# Patient Record
Sex: Female | Born: 1935 | Race: White | Hispanic: No | Marital: Married | Smoking: Never smoker
Health system: Southern US, Community
[De-identification: ages and names within clinical notes are randomized; demographics above are authoritative.]

## PROBLEM LIST (undated history)

## (undated) DIAGNOSIS — I499 Cardiac arrhythmia, unspecified: Secondary | ICD-10-CM

## (undated) DIAGNOSIS — I480 Paroxysmal atrial fibrillation: Secondary | ICD-10-CM

## (undated) DIAGNOSIS — K449 Diaphragmatic hernia without obstruction or gangrene: Secondary | ICD-10-CM

## (undated) DIAGNOSIS — C801 Malignant (primary) neoplasm, unspecified: Secondary | ICD-10-CM

## (undated) DIAGNOSIS — I10 Essential (primary) hypertension: Secondary | ICD-10-CM

## (undated) DIAGNOSIS — E785 Hyperlipidemia, unspecified: Secondary | ICD-10-CM

## (undated) DIAGNOSIS — Z9109 Other allergy status, other than to drugs and biological substances: Secondary | ICD-10-CM

## (undated) DIAGNOSIS — F419 Anxiety disorder, unspecified: Secondary | ICD-10-CM

## (undated) DIAGNOSIS — M1990 Unspecified osteoarthritis, unspecified site: Secondary | ICD-10-CM

## (undated) HISTORY — PX: CHOLECYSTECTOMY: SHX55

## (undated) HISTORY — DX: Other allergy status, other than to drugs and biological substances: Z91.09

## (undated) HISTORY — PX: OTHER SURGICAL HISTORY: SHX169

## (undated) HISTORY — DX: Diaphragmatic hernia without obstruction or gangrene: K44.9

## (undated) HISTORY — DX: Anxiety disorder, unspecified: F41.9

## (undated) HISTORY — DX: Unspecified osteoarthritis, unspecified site: M19.90

## (undated) HISTORY — PX: CYSTECTOMY: SUR359

## (undated) HISTORY — PX: ABDOMINAL HYSTERECTOMY: SHX81

---

## 1998-01-14 ENCOUNTER — Ambulatory Visit (HOSPITAL_COMMUNITY): Admission: RE | Admit: 1998-01-14 | Discharge: 1998-01-14 | Payer: Self-pay | Admitting: Obstetrics and Gynecology

## 1998-03-03 ENCOUNTER — Encounter: Admission: RE | Admit: 1998-03-03 | Discharge: 1998-06-01 | Payer: Self-pay | Admitting: Anesthesiology

## 1998-03-03 ENCOUNTER — Encounter: Payer: Self-pay | Admitting: Anesthesiology

## 1998-04-22 ENCOUNTER — Encounter: Payer: Self-pay | Admitting: Specialist

## 1998-04-22 ENCOUNTER — Ambulatory Visit (HOSPITAL_COMMUNITY): Admission: RE | Admit: 1998-04-22 | Discharge: 1998-04-22 | Payer: Self-pay | Admitting: Specialist

## 1998-05-05 ENCOUNTER — Encounter: Payer: Self-pay | Admitting: Specialist

## 1998-05-06 ENCOUNTER — Inpatient Hospital Stay (HOSPITAL_COMMUNITY): Admission: EM | Admit: 1998-05-06 | Discharge: 1998-05-07 | Payer: Self-pay | Admitting: Specialist

## 1999-04-20 ENCOUNTER — Encounter: Admission: RE | Admit: 1999-04-20 | Discharge: 1999-04-20 | Payer: Self-pay | Admitting: General Surgery

## 1999-04-20 ENCOUNTER — Encounter: Payer: Self-pay | Admitting: General Surgery

## 1999-05-20 ENCOUNTER — Ambulatory Visit (HOSPITAL_COMMUNITY): Admission: RE | Admit: 1999-05-20 | Discharge: 1999-05-20 | Payer: Self-pay | Admitting: *Deleted

## 1999-05-20 ENCOUNTER — Encounter (INDEPENDENT_AMBULATORY_CARE_PROVIDER_SITE_OTHER): Payer: Self-pay | Admitting: Specialist

## 1999-10-14 ENCOUNTER — Ambulatory Visit (HOSPITAL_COMMUNITY): Admission: RE | Admit: 1999-10-14 | Discharge: 1999-10-14 | Payer: Self-pay | Admitting: Specialist

## 1999-10-14 ENCOUNTER — Encounter: Payer: Self-pay | Admitting: Specialist

## 2002-06-30 ENCOUNTER — Encounter: Payer: Self-pay | Admitting: Emergency Medicine

## 2002-06-30 ENCOUNTER — Emergency Department (HOSPITAL_COMMUNITY): Admission: EM | Admit: 2002-06-30 | Discharge: 2002-06-30 | Payer: Self-pay | Admitting: Emergency Medicine

## 2002-10-06 ENCOUNTER — Emergency Department (HOSPITAL_COMMUNITY): Admission: EM | Admit: 2002-10-06 | Discharge: 2002-10-06 | Payer: Self-pay | Admitting: Emergency Medicine

## 2002-10-06 ENCOUNTER — Encounter: Payer: Self-pay | Admitting: Emergency Medicine

## 2007-09-10 ENCOUNTER — Encounter: Admission: RE | Admit: 2007-09-10 | Discharge: 2007-09-10 | Payer: Self-pay | Admitting: Family Medicine

## 2008-09-10 ENCOUNTER — Ambulatory Visit: Payer: Self-pay | Admitting: Vascular Surgery

## 2010-02-10 ENCOUNTER — Ambulatory Visit: Payer: Self-pay | Admitting: Cardiology

## 2010-04-18 ENCOUNTER — Emergency Department (HOSPITAL_BASED_OUTPATIENT_CLINIC_OR_DEPARTMENT_OTHER)
Admission: EM | Admit: 2010-04-18 | Discharge: 2010-04-18 | Payer: Self-pay | Source: Home / Self Care | Admitting: Emergency Medicine

## 2010-08-02 ENCOUNTER — Other Ambulatory Visit: Payer: Self-pay | Admitting: *Deleted

## 2010-08-02 DIAGNOSIS — E7800 Pure hypercholesterolemia, unspecified: Secondary | ICD-10-CM

## 2010-08-17 NOTE — Consult Note (Signed)
NEW PATIENT CONSULTATION   Emily Andrews, Emily Andrews  DOB:  [birth year]                                       09/10/2008  [id number]   Patient presents today for evaluation of lower extremity venous  pathology.  She is a very pleasant 76 year old white female with  progressive changes of venous hypertension in both lower extremities.  She reports that she had what looks like tributary phlebectomies of calf  varicosities 25 years ago, which was in her right calf.  She reports  multiple components of lower extremity discomfort.  This is more severe  in the right leg but is also present in the left leg as well.  She  reports an aching, burning sensation with prolonged standing.  She does  not have any significant swelling.  She does not have any history of  deep venous thrombosis.  She does have one episode of bleeding from her  left pretibial area over superficial telangiectasia.   PAST MEDICAL HISTORY:  1. Hypertension.  2. Elevated cholesterol.  3. Chronic atrial fibrillation, on Coumadin.   FAMILY HISTORY:  Negative for premature arteriosclerotic disease.   SOCIAL HISTORY:  She is married with 1 child.  She does not smoke or  drink alcohol.   REVIEW OF SYSTEMS:  Her weight is reported at 151 pounds.  She is 5 feet  4 inches tall.  She does have chronic atrial fibrillation since 2004.  She has esophageal reflux, pain in her legs with walking and lying flat.  She also has arthritic pain, particularly in her right knee.   She has multiple drug allergies and multiple medications listed in her  chart.   PHYSICAL EXAMINATION:  A well-developed and well-nourished white female  appearing stated age of 76.  Blood pressure is 156/82, pulse 78,  respirations 18.  Her radial and dorsalis pedis pulses are 2+  bilaterally.  She does have several old scars over her right calf from  her prior phlebectomies.  She does not have any significant varicose  veins.  She  does have multiple raised, prominent telangiectasia over  both calves.   She underwent noninvasive venous duplex in our office.  There is no  evidence of reflux in her deep system.  She does have reflux throughout  her right greater saphenous vein and no significant reflux in her left  greater saphenous vein.  She also has reflux in her right small  saphenous vein.   I discussed this at length with the patient.  I explained that in all  likelihood, we could make some impact on the aching discomfort that she  has in her right leg with prolonged standing due to her right saphenous  vein reflux.  I explained with the multiple other difficulties she has  from an orthopedic standpoint, that this would in all likelihood give  her minimal benefit since this is not the only issue involving her right  leg.  I would recommend continued observation currently, and she wishes  to pursue this as well.  I did explain treatment options __________ she  has been unable to tolerate in the past and also laser ablation of her  right greater saphenous vein for reduction in her venous hypertension.  She has had one bleed from the telangiectasia.  She does have a type of  telangiectasia which we do see persistent  bleeding episodes from.  I  explained that this would be potentially another indication if she had  persistent bleeding.  I did explain to her the importance of light  direct pressure over these areas of bleeding should it occur again.  She  understands and will see [place] again on an as-needed basis.   Rosetta Posner, M.D.  Electronically Signed   TFE/MEDQ  D:  09/10/2008  T:  09/10/2008  Job:  [id number]   cc:   Ludwig Lean. [name], M.D.  [place] Maceo Pro, M.D.

## 2010-08-17 NOTE — Procedures (Signed)
LOWER EXTREMITY VENOUS REFLUX EXAM   INDICATION:  Bilateral lower extremity spider veins.   EXAM:  Using color-flow imaging and pulse Doppler spectral analysis, the  right and left common femoral, superficial femoral, popliteal, posterior  tibial, greater and lesser saphenous veins are evaluated.  There is no  evidence suggesting deep venous insufficiency in the right and left  lower extremity.   The right saphenofemoral junction is not competent.  The right GSV is  not competent with the caliber as described below.   The right proximal short saphenous vein demonstrates incompetency.   GSV Diameter (used if found to be incompetent only)                                            Right    Left  Proximal Greater Saphenous Vein           0.59 cm  cm  Proximal-to-mid-thigh                     0.56 cm  cm  Mid thigh                                 0.42 cm  cm  Mid-distal thigh                          cm       cm  Distal thigh                              0.47 cm  cm  Knee                                      0.90 cm  cm    IMPRESSION:  1. Right greater saphenous vein reflux is identified with the caliber      ranging from 0.95 cm to 0.42 cm knee to groin.  2. The right and left greater saphenous veins are not aneurysmal.  3. The right and left greater saphenous veins are not tortuous.  4. The deep venous system is competent.  5. The right lesser saphenous vein is not competent.       ___________________________________________  Rosetta Posner, M.D.   AC/MEDQ  D:  09/10/2008  T:  09/10/2008  Job:  [id number]

## 2010-08-18 ENCOUNTER — Other Ambulatory Visit: Payer: Self-pay | Admitting: Cardiology

## 2010-08-18 ENCOUNTER — Ambulatory Visit (INDEPENDENT_AMBULATORY_CARE_PROVIDER_SITE_OTHER): Payer: Medicare Other | Admitting: Cardiology

## 2010-08-18 ENCOUNTER — Other Ambulatory Visit (INDEPENDENT_AMBULATORY_CARE_PROVIDER_SITE_OTHER): Payer: Medicare Other | Admitting: *Deleted

## 2010-08-18 ENCOUNTER — Encounter: Payer: Self-pay | Admitting: Cardiology

## 2010-08-18 DIAGNOSIS — M47812 Spondylosis without myelopathy or radiculopathy, cervical region: Secondary | ICD-10-CM | POA: Insufficient documentation

## 2010-08-18 DIAGNOSIS — I4891 Unspecified atrial fibrillation: Secondary | ICD-10-CM

## 2010-08-18 DIAGNOSIS — E7800 Pure hypercholesterolemia, unspecified: Secondary | ICD-10-CM | POA: Insufficient documentation

## 2010-08-18 DIAGNOSIS — I10 Essential (primary) hypertension: Secondary | ICD-10-CM

## 2010-08-18 LAB — LIPID PANEL
Cholesterol: 199 mg/dL (ref 0–200)
HDL: 45.9 mg/dL (ref >39.00)
LDL Cholesterol: 125 mg/dL — ABNORMAL HIGH (ref 0–99)
Total CHOL/HDL Ratio: 4
Triglycerides: 140 mg/dL (ref 0.0–149.0)

## 2010-08-18 LAB — BASIC METABOLIC PANEL
BUN: 13 mg/dL (ref 6–23)
Calcium: 9.7 mg/dL (ref 8.4–10.5)
Chloride: 106 mEq/L (ref 96–112)
Creatinine, Ser: 0.5 mg/dL (ref 0.4–1.2)

## 2010-08-18 LAB — HEPATIC FUNCTION PANEL
ALT: 45 U/L — ABNORMAL HIGH (ref 0–35)
Bilirubin, Direct: 0.1 mg/dL (ref 0.0–0.3)
Total Bilirubin: 1.2 mg/dL (ref 0.3–1.2)

## 2010-08-18 MED ORDER — DIGOXIN 250 MCG PO TABS: 250.0000 ug | ORAL_TABLET | Freq: Every day | ORAL | Status: DC

## 2010-08-18 NOTE — Telephone Encounter (Signed)
Has a question about a medication and to make sure that Dr. Mont Dutton gets a copy of the test results.

## 2010-08-18 NOTE — Telephone Encounter (Signed)
Pt requesting a refill on Digoxin 0.25 mg to [facility].  RN e-prescribed Digoxin to [facility].  Pt notified.

## 2010-08-18 NOTE — Assessment & Plan Note (Signed)
Blood pressure remains well controlled. She will see Dr.Dalton Mclean in followup

## 2010-08-18 NOTE — Assessment & Plan Note (Signed)
We will continue to encourage diet management. She thinks her lower extremity weakness has improved since off of statin therapy.

## 2010-08-18 NOTE — Assessment & Plan Note (Addendum)
She's not had recurrent for several years. She's not on warfarin in spite of an increasing [place] score because of age. We'll continue to leave her off of warfarin unless she has recurrence of atrial fibrillation.EKG today shows sinus rhythm but it does show poor R wave progression in V1 to V3

## 2010-08-18 NOTE — Progress Notes (Signed)
Subjective:   Emily Andrews is seen today for followup visit. 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 However, she is off of her statin therapy and thinks that her lower extremity weakness has clearly improved. She has a history of hyperlipemia.  She does have a history of mild hypertension as been well-controlled. There is a question of glucose intolerance that has improved with discontinuation of her statin therapy. We again reviewed the possibility of putting her back on warfarin elected not to do so because of weakness, arthritis in her right knee, and the lack of recurrence of her atrial fibrillation since before 2009  Current Outpatient Prescriptions  Medication Sig Dispense Refill  . amLODipine (NORVASC) 5 MG tablet Take 1 tablet by mouth Daily.      Marland Kitchen aspirin 81 MG tablet Take 81 mg by mouth daily.        . Calcium Carbonate-Vitamin D (CALTRATE 600+D PO) Take 1 tablet by mouth daily.        . digoxin (LANOXIN) 0.25 MG tablet Take 1 tablet by mouth Daily.      Marland Kitchen DIOVAN 320 MG tablet Take 1 tablet by mouth Daily.      . fish oil-omega-3 fatty acids 1000 MG capsule Take 1 g by mouth daily.        . magnesium gluconate (MAGONATE) 500 MG tablet Take 500 mg by mouth daily.        . Multiple Vitamins-Minerals (CENTRUM PO) Take 1 tablet by mouth daily.        . nadolol (CORGARD) 40 MG tablet Take 1 tablet by mouth Daily.      Marland Kitchen omeprazole (PRILOSEC) 20 MG capsule Take 20 mg by mouth daily.          Allergies  Allergen Reactions  . Celebrex (Celecoxib)   . Clindamycin/Lincomycin   . Penicillins   . Phenergan   . Robaxin   . Statins     There is no problem list on file for this patient.   History  Smoking status  . Never Smoker   Smokeless tobacco  . Never Used    History  Alcohol Use No    No family history on file.  Review of  Systems:   The patient denies any heat or cold intolerance.  No weight gain or weight loss.  The patient denies headaches or blurry vision.  There is no cough or sputum production.  The patient denies dizziness.  There is no hematuria or hematochezia.  The patient denies any muscle aches or arthritis.  The patient denies any rash.  The patient denies frequent falling or instability.  There is no history of depression or anxiety.  All other systems were reviewed and are negative.   Physical Exam:   Vital signs are reviewedThe head is normocephalic and atraumatic.  Pupils are equally round and reactive to light.  Sclerae nonicteric.  Conjunctiva is clear.  Oropharynx is unremarkable.  There's adequate oral airway.  Neck is supple there are no masses.  Thyroid is not enlarged.  There is no lymphadenopathy.  Lungs are clear.  Chest is symmetric.  Heart shows a regular rate and rhythm.  S1 and S2 are normal.  There is no murmur click or gallop.  Abdomen is soft normal bowel sounds.  There is no organomegaly.  Genital and rectal deferred.  Extremities are without  edema.  Peripheral pulses are adequate.  Neurologically intact.  Full range of motion.  The patient is not depressed.  Skin is warm and dry. Assessment / Plan:

## 2010-08-19 ENCOUNTER — Telehealth: Payer: Self-pay | Admitting: Cardiology

## 2010-08-19 NOTE — Telephone Encounter (Signed)
Lab results reviewed. Pt has appt with Dr. Maceo Pro in July. Aware of need for recheck of liver enzymes in 6 weeks.

## 2010-08-20 NOTE — Consult Note (Signed)
NAME:  Emily Andrews, [name]                 ACCOUNT NO.:  [id number]   MEDICAL RECORD NO.:  [id number]                   PATIENT TYPE:  EMS   LOCATION:  ED                                   FACILITY:  [facility]   PHYSICIAN:  Peter M. Martinique, M.D.               DATE OF BIRTH:  Jul 05, 1934   DATE OF CONSULTATION:  10/06/2002  DATE OF DISCHARGE:                                   CONSULTATION   REASON FOR CONSULTATION:  Ms. Arambul is a 76 year old white female with a  history of hypertension who presents for evaluation of persistent  palpitations.  The patient states approximately two months ago she had  persistent palpitations lasting over 10 hours.  She was subsequently  evaluated with 24-hour Holter monitor which showed no arrhythmias.  She has  had some moderate episodes of palpitations until approximately 2 p.m. today.  She developed recurrent sustained tachy palpitations lasting approximately  four hours.  She came to the emergency room where ECG showed atrial  fibrillation with a rate of 126 beats per minute.  She was given a bolus of  IV Cardizem and subsequently converted back to normal sinus rhythm.  She  denies any chest pain, shortness of breath, dizziness, or syncope.  She has  no prior history of angina or myocardial infarction.  She has no history of  CVA.   PAST MEDICAL HISTORY:  Significant for:  1. Hypertension.  2. She has had previous cholecystectomy.  3. Hysterectomy.  4. Removal of synovial cyst.  5. Removal of a lipoma from her right shoulder.  6. She has no history of diabetes or hypercholesterolemia.   ALLERGIES:  ROBAXIN, PENICILLIN, PHENERGAN, CLINDEX, TYLOX, CELEBREX, and  M__________.   MEDICATIONS:  Currently include:  1. Caltrate 2 tablets daily.  2. Vitamin E 400 international units daily.  3. Centrum daily.  4. Aspirin 81 mg per day.  5. Micardis 40 mg per day.  6. Nadolol 20 mg per day.  7. Premarin 0.625 mg daily.  8. Norvasc 5 mg per  day.   SOCIAL HISTORY:  The patient is married, with children.  She is a retired  Midwife.  She denies smoking or alcohol use and does not use  caffeine.   FAMILY HISTORY:  Noncontributory.   REVIEW OF SYSTEMS:  Otherwise negative.  The patient is very active, walks  two miles a day.   PHYSICAL EXAM:  GENERAL:  The patient is a well-developed white female in no  apparent distress.  VITAL SIGNS:  Blood pressure is 118/78, pulse is 74 and regular,  respirations are 20.  She is afebrile.  HEENT:  Pupils are equal, round, and reactive to light and accommodation.  Extraocular movements are full.  Oropharynx is clear.  NECK:  Without JVD, adenopathy, thyromegaly, or bruits.  LUNGS:  Clear.  CARDIAC:  Reveals regular rate and rhythm.  Without gallops, murmurs, rubs,  or clicks.  ABDOMEN:  Soft  and nontender.  There is no hepatosplenomegaly, masses, or  bruits.  EXTREMITIES:  Without edema.  Pulses are 2+ and symmetric.  NEUROLOGIC:  Intact.   LABORATORY DATA:  ECG shows atrial fibrillation, rate of 126.  There is LVH  by voltage.  There is evidence of anterior myocardial infarction, age  undetermined, with poor R-wave progression across the anterior precordium.  Cannot rule out old inferior infarction as well.  There are no acute ST and  T-wave changes.  Follow-up ECG shows normal sinus rhythm, rate of 64,  otherwise no change.  Chest x-ray shows borderline cardiomegaly.  No active  disease.   White count is 6800, hemoglobin 13.8, hematocrit 44.8, glucose 248.  CK-MB  and troponin are normal.  Sodium 141, potassium 3.8, chloride 109, CO2 27,  BUN 13, creatinine 0.7, glucose 96.   IMPRESSION:  1. Atrial fibrillation with rapid ventricular response, newly diagnosed, now     converted to normal sinus rhythm.  2. Hypertension.  3. Left ventricular hypertrophy.  4. Abnormal ECG, possible old anterior myocardial infarction.  Patient     asymptomatic.   PLAN:  Will discharge  to home.  Will increase nadolol to 40 mg per day for  rate control.  Will anticoagulate with Coumadin starting at 5 mg per day.  Will have her come in for her first pro time on Wednesday.  Will stop her  aspirin and vitamin E while on Coumadin.  Will schedule her for an  outpatient echocardiogram and have her follow up with Dr. [name] in  approximately 10 days.  May need to consider stress test as an outpatient as  well.                                               Peter M. Martinique, M.D.    PMJ/MEDQ  D:  10/06/2002  T:  10/06/2002  Job:  [id number]   cc:   Ludwig Lean. [name], M.D.  [id number] [place]  [place] [address]  Fax: [place] Maceo Pro, M.D.  [address] [place]  [place] [address]  Fax: [phone]

## 2011-02-16 ENCOUNTER — Ambulatory Visit (INDEPENDENT_AMBULATORY_CARE_PROVIDER_SITE_OTHER): Payer: Medicare Other | Admitting: Cardiology

## 2011-02-16 ENCOUNTER — Encounter (INDEPENDENT_AMBULATORY_CARE_PROVIDER_SITE_OTHER): Payer: Medicare Other

## 2011-02-16 ENCOUNTER — Encounter: Payer: Self-pay | Admitting: Cardiology

## 2011-02-16 VITALS — BP 114/78 | HR 51 | Ht 64.0 in | Wt 151.0 lb

## 2011-02-16 DIAGNOSIS — E7800 Pure hypercholesterolemia, unspecified: Secondary | ICD-10-CM

## 2011-02-16 DIAGNOSIS — R7989 Other specified abnormal findings of blood chemistry: Secondary | ICD-10-CM

## 2011-02-16 DIAGNOSIS — I4891 Unspecified atrial fibrillation: Secondary | ICD-10-CM

## 2011-02-16 LAB — HEPATIC FUNCTION PANEL
Bilirubin, Direct: 0.1 mg/dL (ref 0.0–0.3)
Total Bilirubin: 1.3 mg/dL — ABNORMAL HIGH (ref 0.3–1.2)
Total Protein: 7.2 g/dL (ref 6.0–8.3)

## 2011-02-16 MED ORDER — DIGOXIN 125 MCG PO TABS: 125.0000 ug | ORAL_TABLET | Freq: Every day | ORAL | Status: DC

## 2011-02-16 NOTE — Assessment & Plan Note (Addendum)
No documented atrial fibrillation for over 3 years.  CHADSVASC score = 3.  She is on aspirin at this time.  She feels occasional fluttering in her chest but no long runs of tachypalpitations.  I am going to have her wear a 3 week event monitor to assess for any runs of atrial fibrillation.  If she does have atrial fibrillation, I would start her on rivaroxaban most likely.  If no atrial fibrillation noted, continue ASA for now.  She can continue nadolol but I would decrease digoxin to 0.125 mg daily with likely discontinuation of this med in the future.

## 2011-02-16 NOTE — Patient Instructions (Addendum)
Decrease digoxin (lanoxin)  to 0.125mg  daily. You can take 1/2 of a 0.25mg  tablet daily.  Your physician recommends that you have  lab work today--Digoxin level/ Liver profile 427.31  Your physician has recommended that you wear an event monitor. Event monitors are medical devices that record the heart's electrical activity. Doctors most often [place] these monitors to diagnose arrhythmias. Arrhythmias are problems with the speed or rhythm of the heartbeat. The monitor is a small, portable device. You can wear one while you do your normal daily activities. This is usually used to diagnose what is causing palpitations/syncope (passing out). 3 week event monitor  Your physician wants you to follow-up in: 6 months with Dr Aundra Dubin. (May 2013). You will receive a reminder letter in the mail two months in advance. If you don't receive a letter, please call our office to schedule the follow-up appointment.

## 2011-02-16 NOTE — Progress Notes (Signed)
PCP: Dr. Maceo Pro  76 yo with history of paroxysmal atrial fibrillation presents for cardiology followup.  She has seen Dr. [name] in the past and is seeing me for the first time today. She had at least 2 atrial fibrillation episodes over 3 years ago and has not had a documented recurrence since that time.  She gets occasional fluttering in her chest but nothing that persists more than a few seconds.  She is in sinus rhythm today.  She was never started on warfarin since further atrial fibrillation has not been documented.   Emily Andrews's main limitation is leg weakness and knee pain.  She developed very severe leg weakness and aching while taking pravastatin.  This recovered off the statin but has never completely resolved.  She has right > left knee pain from osteoarthritis.  She still walks about 1/2 mile a day.  She denies exertional dyspnea or chest pain.   ECG: NSR, 1st degree AV block, possible old anterior MI  Labs (5/12): K 4.2, creatinine 0.5, LDL 125, HDL 46 Labs (7/12): K 4.2, creatinine 0.5, AST 63, ALT 64, HCT 43.3  PMH: 1. Paroxysmal atrial fibrillation: No documented recurrence since prior to 2009.  She has not been on warfarin.  2. Hyperlipidemia: Myalgias and leg weakness with pravastatin (very severe symptoms).  3. HTN 4. Osteoarthritis R > L knee  SH: Lives in [place] with husband.  Nonsmoker.    FH: No premature CAD.   ROS: All systems reviewed and negative except as per HPI.   Current Outpatient Prescriptions  Medication Sig Dispense Refill  . amLODipine (NORVASC) 5 MG tablet Take 1 tablet by mouth Daily.      Marland Kitchen aspirin 81 MG tablet Take 81 mg by mouth daily.        . Calcium Carbonate-Vitamin D (CALTRATE 600+D PO) Take 2 tablets by mouth daily.       . cyanocobalamin 100 MCG tablet Take 100 mcg by mouth daily.        Marland Kitchen DIOVAN 320 MG tablet Take 1 tablet by mouth Daily.      . fish oil-omega-3 fatty acids 1000 MG capsule Take 2 g by mouth daily.       . magnesium  gluconate (MAGONATE) 500 MG tablet Take 500 mg by mouth daily.        . Multiple Vitamins-Minerals (CENTRUM PO) Take 1 tablet by mouth daily.        . nadolol (CORGARD) 40 MG tablet Take 1 tablet by mouth Daily.      Marland Kitchen omeprazole (PRILOSEC) 20 MG capsule Take 20 mg by mouth daily.        Marland Kitchen DISCONTD: digoxin (LANOXIN) 0.25 MG tablet Take 1 tablet (250 mcg total) by mouth daily.  90 tablet  2  . digoxin (LANOXIN) 0.125 MG tablet Take 1 tablet (125 mcg total) by mouth daily.  90 tablet  3    BP 114/78  Pulse 51  Ht 5\' 4"  (1.626 m)  Wt 68.493 kg (151 lb)  BMI 25.92 kg/m2 General: NAD Neck: No JVD, no thyromegaly or thyroid nodule.  Lungs: Clear to auscultation bilaterally with normal respiratory effort. CV: Nondisplaced PMI.  Heart regular S1/S2, no S3/S4, no murmur.  No peripheral edema.  No carotid bruit.  Normal pedal pulses. Varicosities in lower legs bilaterally.  Abdomen: Soft, nontender, no hepatosplenomegaly, no distention.  Neurologic: Alert and oriented x 3.  Psych: Normal affect. Extremities: No clubbing or cyanosis.

## 2011-02-16 NOTE — Assessment & Plan Note (Signed)
Mild transaminase elevation in 7/12.  She has not been on a statin.  I will repeat LFTs today.

## 2011-02-16 NOTE — Assessment & Plan Note (Signed)
Patient has been unable to take statins due to severe muscle weakness.  Continue to watch diet closely.  No known vascular disease.

## 2011-02-17 LAB — DIGOXIN LEVEL: Digoxin Level: 2 ng/mL (ref 0.8–2.0)

## 2011-02-21 ENCOUNTER — Telehealth: Payer: Self-pay | Admitting: *Deleted

## 2011-02-21 ENCOUNTER — Telehealth: Payer: Self-pay | Admitting: Cardiology

## 2011-02-21 NOTE — Telephone Encounter (Signed)
F/up to previous call She has questions about meds she spoke to you earlier please call her back

## 2011-02-21 NOTE — Telephone Encounter (Signed)
I talked with pt .

## 2011-02-21 NOTE — Telephone Encounter (Signed)
Notes Recorded by Katrine Coho, RN on 02/21/2011 at 8:49 AM I talked with pt. Pt is aware she should stop digoxin altogether. She is also aware of mildly elevated LFTs and that I will send a copy to Dr Briscoe Deutscher, her PCP. Notes Recorded by Loralie Champagne, MD on 02/21/2011 at 12:11 AM LFTs are mildly elevated still. Please send copy to PCP. She will need further workup of this as it has been persistent. Notes Recorded by Loralie Champagne, MD on 02/21/2011 at 12:10 AM Digoxin level was quite high. She should stop digoxin altogether.

## 2011-02-22 NOTE — Progress Notes (Signed)
Addended by: Marlis Edelson C on: 02/22/2011 01:46 PM   Modules accepted: Orders

## 2011-02-28 ENCOUNTER — Telehealth: Payer: Self-pay | Admitting: Cardiology

## 2011-02-28 NOTE — Telephone Encounter (Signed)
I talked with pt. Pt states she is allergic to the electrodes she has been using and the monitor company is sending hypoallergenic electrodes for her to use.  She will use benadryl cream as needed to affected areas.

## 2011-02-28 NOTE — Telephone Encounter (Signed)
New problem Pt wanted to let you know about new monitor patches she is getting please call

## 2011-03-21 ENCOUNTER — Telehealth: Payer: Self-pay | Admitting: *Deleted

## 2011-03-21 NOTE — Telephone Encounter (Signed)
Dr Aundra Dubin reviewed monitor done 02/16/11-03/08/11. NSR. No sig events noted. Pt notified by telephone of results.

## 2011-03-22 ENCOUNTER — Telehealth: Payer: Self-pay | Admitting: Cardiology

## 2011-03-22 NOTE — Telephone Encounter (Signed)
New msg Pt wants to go over the meds she is supposed to be taking. Please call

## 2011-03-22 NOTE — Telephone Encounter (Signed)
Reviewed current med list with patient.  Norberta Keens, LPN

## 2011-06-27 ENCOUNTER — Encounter (HOSPITAL_COMMUNITY): Payer: Self-pay | Admitting: Emergency Medicine

## 2011-06-27 ENCOUNTER — Other Ambulatory Visit: Payer: Self-pay

## 2011-06-27 ENCOUNTER — Inpatient Hospital Stay (HOSPITAL_COMMUNITY)
Admission: EM | Admit: 2011-06-27 | Discharge: 2011-06-28 | DRG: 310 | Disposition: A | Discharge status: Home / Self Care | Payer: Medicare Other | Attending: Cardiology | Admitting: Cardiology

## 2011-06-27 ENCOUNTER — Emergency Department (HOSPITAL_COMMUNITY): Payer: Medicare Other

## 2011-06-27 DIAGNOSIS — I10 Essential (primary) hypertension: Secondary | ICD-10-CM | POA: Diagnosis present

## 2011-06-27 DIAGNOSIS — I440 Atrioventricular block, first degree: Secondary | ICD-10-CM | POA: Diagnosis present

## 2011-06-27 DIAGNOSIS — I4891 Unspecified atrial fibrillation: Secondary | ICD-10-CM | POA: Insufficient documentation

## 2011-06-27 DIAGNOSIS — E119 Type 2 diabetes mellitus without complications: Secondary | ICD-10-CM | POA: Diagnosis present

## 2011-06-27 DIAGNOSIS — E7800 Pure hypercholesterolemia, unspecified: Secondary | ICD-10-CM | POA: Diagnosis present

## 2011-06-27 DIAGNOSIS — Z880 Allergy status to penicillin: Secondary | ICD-10-CM

## 2011-06-27 DIAGNOSIS — F411 Generalized anxiety disorder: Secondary | ICD-10-CM | POA: Diagnosis present

## 2011-06-27 DIAGNOSIS — M47812 Spondylosis without myelopathy or radiculopathy, cervical region: Secondary | ICD-10-CM | POA: Diagnosis present

## 2011-06-27 DIAGNOSIS — E785 Hyperlipidemia, unspecified: Secondary | ICD-10-CM | POA: Diagnosis present

## 2011-06-27 DIAGNOSIS — R7989 Other specified abnormal findings of blood chemistry: Secondary | ICD-10-CM | POA: Diagnosis present

## 2011-06-27 DIAGNOSIS — Z79899 Other long term (current) drug therapy: Secondary | ICD-10-CM

## 2011-06-27 DIAGNOSIS — Z888 Allergy status to other drugs, medicaments and biological substances status: Secondary | ICD-10-CM

## 2011-06-27 HISTORY — DX: Hyperlipidemia, unspecified: E78.5

## 2011-06-27 HISTORY — DX: Paroxysmal atrial fibrillation: I48.0

## 2011-06-27 LAB — BASIC METABOLIC PANEL
CO2: 25 mEq/L (ref 19–32)
GFR calc non Af Amer: 89 mL/min — ABNORMAL LOW (ref >90)
Glucose, Bld: 119 mg/dL — ABNORMAL HIGH (ref 70–99)
Potassium: 3.7 mEq/L (ref 3.5–5.1)
Sodium: 141 mEq/L (ref 135–145)

## 2011-06-27 LAB — CARDIAC PANEL(CRET KIN+CKTOT+MB+TROPI)
CK, MB: 2.6 ng/mL (ref 0.3–4.0)
Relative Index: INVALID (ref 0.0–2.5)
Total CK: 64 U/L (ref 7–177)
Troponin I: <0.3 ng/mL

## 2011-06-27 LAB — PROTIME-INR
INR: 1.08 (ref 0.00–1.49)
Prothrombin Time: 14.2 s (ref 11.6–15.2)

## 2011-06-27 LAB — CBC
Hemoglobin: 15.4 g/dL — ABNORMAL HIGH (ref 12.0–15.0)
MCHC: 34.5 g/dL (ref 30.0–36.0)
RBC: 4.9 MIL/uL (ref 3.87–5.11)

## 2011-06-27 LAB — GLUCOSE, CAPILLARY: Glucose-Capillary: 155 mg/dL — ABNORMAL HIGH (ref 70–99)

## 2011-06-27 LAB — DIGOXIN LEVEL: Digoxin Level: <0.3 ng/mL — ABNORMAL LOW (ref 0.8–2.0)

## 2011-06-27 LAB — MAGNESIUM: Magnesium: 1.6 mg/dL (ref 1.5–2.5)

## 2011-06-27 MED ORDER — DILTIAZEM HCL 50 MG/10ML IV SOLN
10.0000 mg | Freq: Once | INTRAVENOUS | Status: AC
  Administered 2011-06-27: 10 mg via INTRAVENOUS
  Filled 2011-06-27: qty 2

## 2011-06-27 MED ORDER — ACETAMINOPHEN 325 MG PO TABS: 650.0000 mg | ORAL_TABLET | ORAL | Status: DC | PRN

## 2011-06-27 MED ORDER — NITROGLYCERIN 0.4 MG SL SUBL: 0.4000 mg | SUBLINGUAL_TABLET | SUBLINGUAL | Status: DC | PRN

## 2011-06-27 MED ORDER — OMEGA-3 FATTY ACIDS 1000 MG PO CAPS: 2.0000 g | ORAL_CAPSULE | Freq: Every day | ORAL | Status: DC

## 2011-06-27 MED ORDER — MAGNESIUM GLUCONATE 500 MG PO TABS
500.0000 mg | ORAL_TABLET | Freq: Every day | ORAL | Status: DC
  Administered 2011-06-27: 250 mg via ORAL
  Administered 2011-06-28: 500 mg via ORAL
  Filled 2011-06-27 (×2): qty 1

## 2011-06-27 MED ORDER — SODIUM CHLORIDE 0.9 % IV SOLN
INTRAVENOUS | Status: DC
  Administered 2011-06-27: 16:00:00 via INTRAVENOUS

## 2011-06-27 MED ORDER — METOPROLOL TARTRATE 50 MG PO TABS
50.0000 mg | ORAL_TABLET | Freq: Two times a day (BID) | ORAL | Status: DC
  Administered 2011-06-27 – 2011-06-28 (×2): 50 mg via ORAL
  Filled 2011-06-27 (×4): qty 1

## 2011-06-27 MED ORDER — CALCIUM CARBONATE-VITAMIN D 600-400 MG-UNIT PO TABS: 2.0000 | ORAL_TABLET | Freq: Every day | ORAL | Status: DC

## 2011-06-27 MED ORDER — VITAMIN B-12 100 MCG PO TABS
100.0000 ug | ORAL_TABLET | Freq: Every day | ORAL | Status: DC
  Administered 2011-06-28: 100 ug via ORAL
  Filled 2011-06-27: qty 1

## 2011-06-27 MED ORDER — INSULIN ASPART 100 UNIT/ML ~~LOC~~ SOLN
0.0000 [IU] | Freq: Three times a day (TID) | SUBCUTANEOUS | Status: DC
  Administered 2011-06-28: 2 [IU] via SUBCUTANEOUS

## 2011-06-27 MED ORDER — ADULT MULTIVITAMIN W/MINERALS CH
1.0000 | ORAL_TABLET | Freq: Every day | ORAL | Status: DC
  Administered 2011-06-28: 1 via ORAL
  Filled 2011-06-27: qty 1

## 2011-06-27 MED ORDER — LOSARTAN POTASSIUM 50 MG PO TABS
100.0000 mg | ORAL_TABLET | Freq: Every day | ORAL | Status: DC
  Administered 2011-06-28: 100 mg via ORAL
  Filled 2011-06-27: qty 2

## 2011-06-27 MED ORDER — ONDANSETRON HCL 4 MG/2ML IJ SOLN: 4.0000 mg | Freq: Four times a day (QID) | INTRAMUSCULAR | Status: DC | PRN

## 2011-06-27 MED ORDER — ZOLPIDEM TARTRATE 5 MG PO TABS: 5.0000 mg | ORAL_TABLET | Freq: Every evening | ORAL | Status: DC | PRN

## 2011-06-27 MED ORDER — OMEGA-3-ACID ETHYL ESTERS 1 G PO CAPS
2.0000 g | ORAL_CAPSULE | Freq: Every day | ORAL | Status: DC
  Administered 2011-06-28: 2 g via ORAL
  Filled 2011-06-27: qty 2

## 2011-06-27 MED ORDER — PANTOPRAZOLE SODIUM 40 MG PO TBEC
40.0000 mg | DELAYED_RELEASE_TABLET | Freq: Every day | ORAL | Status: DC
  Administered 2011-06-28: 40 mg via ORAL
  Filled 2011-06-27: qty 1

## 2011-06-27 MED ORDER — DILTIAZEM HCL 100 MG IV SOLR
5.0000 mg/h | INTRAVENOUS | Status: DC
  Administered 2011-06-27 (×3): 10 mg/h via INTRAVENOUS
  Administered 2011-06-27: 5 mg/h via INTRAVENOUS
  Filled 2011-06-27 (×2): qty 100

## 2011-06-27 MED ORDER — DABIGATRAN ETEXILATE MESYLATE 150 MG PO CAPS
150.0000 mg | ORAL_CAPSULE | Freq: Two times a day (BID) | ORAL | Status: DC
  Administered 2011-06-27: 150 mg via ORAL
  Filled 2011-06-27 (×4): qty 1

## 2011-06-27 MED ORDER — CALCIUM CARBONATE-VITAMIN D 500-200 MG-UNIT PO TABS
2.0000 | ORAL_TABLET | Freq: Every day | ORAL | Status: DC
  Administered 2011-06-28: 2 via ORAL
  Filled 2011-06-27: qty 2

## 2011-06-27 MED ORDER — CENTRUM PO TABS: 1.0000 | ORAL_TABLET | Freq: Every day | ORAL | Status: DC

## 2011-06-27 MED ORDER — ALPRAZOLAM 0.25 MG PO TABS: 0.2500 mg | ORAL_TABLET | Freq: Two times a day (BID) | ORAL | Status: DC | PRN

## 2011-06-27 NOTE — H&P (Signed)
Patient ID: Emily Andrews MRN: [id number], DOB/AGE: Sep 23, 1934   Admit date: 06/27/2011   Primary Physician: Abigail Miyamoto, MD, MD Primary Cardiologist: Einar Crow, MD  Pt. Profile:  77 y/o female with h/o PAF (not on anticoagulation) who presents with recurrent a.fib.   Problem List  Past Medical History  Diagnosis Date  . Environmental allergies   . Hiatal hernia   . Arthritis     a. R>L knees  . Diabetes mellitus   . Anxiety   . Paroxysmal atrial fibrillation   . Hyperlipidemia     a. statin intolerant - myalgias    Past Surgical History  Procedure Date  . Cholecystectomy   . Abdominal hysterectomy   . Lipoma tumor removed     right shoulder  . Cystectomy     lower back     Allergies  Allergies  Allergen Reactions  . Celebrex (Celecoxib)   . Clindamycin/Lincomycin   . Penicillins   . Phenergan   . Robaxin   . Statins   . Adhesive (Tape) Itching and Rash    Pt allergic to monitor electrodes    HPI  77 y/o female with the above problem list.  She has a h/o PAF dating back to July of 2004.  She had 2 episodes that month but never required cardioversion.  She was on coumadin for some time but this was discontinued since she was not having any recurrent a.fib.  She was last seen by Dr. Aundra Dubin in 02/2011 and had an event monitor following that visit w/o recurrent afib noted.  Today, she was eating lunch and felt indigestion->belched->then went into afib described as tachypalps.  No c/p, sob, or presyncope.  She presented to a local urgent care and was found to be in afib w/ rvr and was referred to the ED.  Here, she has remained tachycardic despite IV dilt bolus/gtt.  We've been asked to eval.  At rest, she is relatively symptom free but does note mild tachypalps (rates 90's-1teens).  Home Medications  Prior to Admission medications   Medication Sig Start Date End Date Taking? Authorizing Provider  amLODipine (NORVASC) 5 MG tablet Take 1 tablet by  mouth Daily. 05/29/10  Yes Historical Provider, MD  aspirin 81 MG tablet Take 81 mg by mouth daily.     Yes Historical Provider, MD  Calcium Carbonate-Vitamin D (CALTRATE 600+D PO) Take 2 tablets by mouth daily.    Yes Historical Provider, MD  cyanocobalamin 100 MCG tablet Take 100 mcg by mouth daily.     Yes Historical Provider, MD  fish oil-omega-3 fatty acids 1000 MG capsule Take 2 g by mouth daily.    Yes Historical Provider, MD  losartan (COZAAR) 100 MG tablet Take 100 mg by mouth daily.   Yes Historical Provider, MD  magnesium gluconate (MAGONATE) 500 MG tablet Take 500 mg by mouth daily.     Yes Historical Provider, MD  metoprolol (LOPRESSOR) 50 MG tablet Take 50 mg by mouth 2 (two) times daily.   Yes Historical Provider, MD  Multiple Vitamins-Minerals (CENTRUM PO) Take 1 tablet by mouth daily.     Yes Historical Provider, MD  omeprazole (PRILOSEC) 20 MG capsule Take 20 mg by mouth daily.     Yes Historical Provider, MD    Family History  Family History  Problem Relation Age of Onset  . Liver disease Mother     cirrhosis  . Heart failure Mother     insufficiency  . Stroke Father   .  Other Mother     vascular disease    Social History  History   Social History  . Marital Status: Married    Spouse Name: N/A    Number of Children: N/A  . Years of Education: N/A   Occupational History  . Not on file.   Social History Main Topics  . Smoking status: Never Smoker   . Smokeless tobacco: Never Used  . Alcohol Use: No  . Drug Use: No  . Sexually Active: Not on file   Other Topics Concern  . Not on file   Social History Narrative  . No narrative on file     Review of Systems General:  No chills, fever, night sweats or weight changes.  Cardiovascular:  +++palpitations. No chest pain, dyspnea on exertion, edema, orthopnea, paroxysmal nocturnal dyspnea. Dermatological: No rash, lesions/masses Respiratory: No cough, dyspnea Urologic: No hematuria, dysuria Abdominal:    No nausea, vomiting, diarrhea, bright red blood per rectum, melena, or hematemesis Neurologic:  No visual changes, wkns, changes in mental status. All other systems reviewed and are otherwise negative except as noted above.  Physical Exam  Blood pressure 151/78, pulse 123, temperature 97.7 F (36.5 C), temperature source Oral, resp. rate 22, SpO2 96.00%.  General: Pleasant, NAD Psych: Normal affect. Neuro: Alert and oriented X 3. Moves all extremities spontaneously. HEENT: Normal  Neck: Supple without bruits or JVD. Lungs:  Resp regular and unlabored.  Bibasilar crackles. Heart: Irreg, Irreg. No s3, s4, or murmurs. Abdomen: Soft, non-tender, non-distended, BS + x 4.  Extremities: No clubbing, cyanosis or edema. DP/PT/Radials 2+ and equal bilaterally.  Labs   Basename 06/27/11 1456  CKTOTAL --  CKMB --  TROPONINI <0.30   Lab Results  Component Value Date   WBC 8.6 06/27/2011   HGB 15.4* 06/27/2011   HCT 44.7 06/27/2011   MCV 91.2 06/27/2011   PLT 227 06/27/2011    Lab 06/27/11 1455  NA 141  K 3.7  CL 104  CO2 25  BUN 17  CREATININE 0.58  CALCIUM 10.1  PROT --  BILITOT --  ALKPHOS --  ALT --  AST --  GLUCOSE 119*    No results found for this basename: DDIMER     Radiology/Studies  Dg Chest Portable 1 View  06/27/2011  *RADIOLOGY REPORT*  Clinical Data: Palpitations.  PORTABLE CHEST - 1 VIEW  Comparison: None  Findings: The cardiac silhouette, mediastinal and hilar contours are within normal limits.  The lungs are clear. Low lung volumes with mild vascular crowding and streaky basilar atelectasis.  No pleural effusion. The bony thorax is intact.  IMPRESSION: Low lung volumes with vascular crowding and bibasilar atelectasis.  Original Report Authenticated By: P. Kalman Jewels, M.D.    ECG  afib 137, LAD, LVH, poor R prog.  ASSESSMENT AND PLAN  1.  PAF w/ RVR:  Pt with recurrent afib since about 11:30 AM.  She is in afib w/ RVR here in the ED.  We will continue  IV Dilt and add pradaxa (CHADS2 is 2).  Will keep NPO after midnight for possible DCCV in AM.  Cont BB.  Check echo, Mg, TSH.  2.  HTN:  Follow on Dilt/BB.  3.  Borderline DM:  Add SSI.  Diet controlled @ home.   Signed, Murray Hodgkins, NP 06/27/2011, 4:58 PM As above, patient seen and examined; briefly, 77 yo female with PMH of atrial fibrillation, hypertension and borderline DM with atrial fibrillation. Developed afib at approximately 11-11:30 this AM (  palpitations associated with chest tightness); ECG atrial fibrillation, CRO SMI, LVH. CRO prior inferior MI. Plan admit and rule out MI; if enzymes negative, outpatient myoview. Check echo and TSH. Add cardizem for rate control. If she does not convert, DCCV in AM as atrial fibrillation < 24 hours in duration. Add pradaxa [id number] BID as embolic risk factors of female sex, hypertension, DM and age >26.

## 2011-06-27 NOTE — ED Notes (Signed)
Pt sent here by [facility] for palpitations with chest tightness starting a couple of hours ago; pt sts hx of same

## 2011-06-27 NOTE — ED Provider Notes (Signed)
History     CSN: [id number]  Arrival date & time 06/27/11  1346   First MD Initiated Contact with Patient 06/27/11 1503      Chief Complaint  Patient presents with  . Palpitations    (Consider location/radiation/quality/duration/timing/severity/associated sxs/prior treatment) Patient is a 77 y.o. female presenting with palpitations. The history is provided by the patient.  Palpitations  This is a recurrent problem. The current episode started 3 to 5 hours ago. The problem occurs constantly. The problem has not changed since onset.The problem is associated with an unknown factor. On average, each episode lasts 3 hours. Associated symptoms include irregular heartbeat. Pertinent negatives include no diaphoresis, no fever, no chest pain, no chest pressure, no syncope, no abdominal pain, no nausea, no vomiting, no headaches, no lower extremity edema, no dizziness, no weakness, no cough and no shortness of breath. She has tried nothing for the symptoms.    Past Medical History  Diagnosis Date  . Environmental allergies   . Hiatal hernia   . Arthritis   . Diabetes mellitus   . Anxiety   . Atrial fibrillation     Past Surgical History  Procedure Date  . Cholecystectomy   . Abdominal hysterectomy   . Lipoma tumor removed     right shoulder  . Cystectomy     lower back    Family History  Problem Relation Age of Onset  . Liver disease Mother     cirosis  . Heart failure Mother     insufficiency  . Stroke Father   . Other Mother     vascular disease    History  Substance Use Topics  . Smoking status: Never Smoker   . Smokeless tobacco: Never Used  . Alcohol Use: No    OB History    Grav Para Term Preterm Abortions TAB SAB Ect Mult Living                  Review of Systems  Constitutional: Negative for fever, chills and diaphoresis.  HENT: Negative for congestion and rhinorrhea.   Respiratory: Positive for chest tightness. Negative for cough and shortness of  breath.   Cardiovascular: Positive for palpitations. Negative for chest pain, leg swelling and syncope.  Gastrointestinal: Negative for nausea, vomiting, abdominal pain and constipation.  Genitourinary: Negative for urgency, decreased urine volume and difficulty urinating.  Skin: Negative for wound.  Neurological: Negative for dizziness, weakness and headaches.  Psychiatric/Behavioral: Negative for confusion.  All other systems reviewed and are negative.    Allergies  Celebrex; Clindamycin/lincomycin; Penicillins; Phenergan; Robaxin; Statins; and Adhesive  Home Medications   Current Outpatient Rx  Name Route Sig Dispense Refill  . AMLODIPINE BESYLATE 5 MG PO TABS Oral Take 1 tablet by mouth Daily.    . ASPIRIN 81 MG PO TABS Oral Take 81 mg by mouth daily.      Marland Kitchen CALTRATE 600+D PO Oral Take 2 tablets by mouth daily.     . CYANOCOBALAMIN 100 MCG PO TABS Oral Take 100 mcg by mouth daily.      . OMEGA-3 FATTY ACIDS 1000 MG PO CAPS Oral Take 2 g by mouth daily.     Marland Kitchen LOSARTAN POTASSIUM 100 MG PO TABS Oral Take 100 mg by mouth daily.    Marland Kitchen MAGNESIUM GLUCONATE 500 MG PO TABS Oral Take 500 mg by mouth daily.      Marland Kitchen METOPROLOL TARTRATE 50 MG PO TABS Oral Take 50 mg by mouth 2 (two) times daily.    Marland Kitchen  CENTRUM PO Oral Take 1 tablet by mouth daily.      Marland Kitchen OMEPRAZOLE 20 MG PO CPDR Oral Take 20 mg by mouth daily.        BP 106/67  Pulse 150  Temp(Src) 97.7 F (36.5 C) (Oral)  Resp 20  SpO2 97%  Physical Exam  Nursing note and vitals reviewed. Constitutional: She is oriented to person, place, and time. She appears well-developed and well-nourished. No distress.  HENT:  Head: Normocephalic and atraumatic.  Right Ear: External ear normal.  Left Ear: External ear normal.  Nose: Nose normal.  Mouth/Throat: Oropharynx is clear and moist.  Neck: Neck supple.  Cardiovascular: Normal heart sounds and intact distal pulses.  An irregularly irregular rhythm present. Tachycardia present.     Pulmonary/Chest: Effort normal and breath sounds normal. No respiratory distress. She has no wheezes. She has no rales.  Abdominal: Soft. She exhibits no distension. There is no tenderness.  Musculoskeletal: She exhibits no edema.  Lymphadenopathy:    She has no cervical adenopathy.  Neurological: She is alert and oriented to person, place, and time.  Skin: Skin is warm and dry. She is not diaphoretic. No pallor.    ED Course  Procedures (including critical care time)  Labs Reviewed  CBC - Abnormal; Notable for the following:    Hemoglobin 15.4 (*)    All other components within normal limits  BASIC METABOLIC PANEL - Abnormal; Notable for the following:    Glucose, Bld 119 (*)    GFR calc non Af Amer 89 (*)    All other components within normal limits  DIGOXIN LEVEL - Abnormal; Notable for the following:    Digoxin Level <0.3 (*)    All other components within normal limits  GLUCOSE, CAPILLARY - Abnormal; Notable for the following:    Glucose-Capillary 155 (*)    All other components within normal limits  TROPONIN I  CARDIAC PANEL(CRET KIN+CKTOT+MB+TROPI)  PROTIME-INR  APTT  MAGNESIUM  CARDIAC PANEL(CRET KIN+CKTOT+MB+TROPI)  CARDIAC PANEL(CRET KIN+CKTOT+MB+TROPI)  TSH  BASIC METABOLIC PANEL  CBC  LIPID PANEL   Dg Chest Portable 1 View  06/27/2011  *RADIOLOGY REPORT*  Clinical Data: Palpitations.  PORTABLE CHEST - 1 VIEW  Comparison: None  Findings: The cardiac silhouette, mediastinal and hilar contours are within normal limits.  The lungs are clear. Low lung volumes with mild vascular crowding and streaky basilar atelectasis.  No pleural effusion. The bony thorax is intact.  IMPRESSION: Low lung volumes with vascular crowding and bibasilar atelectasis.  Original Report Authenticated By: P. Kalman Jewels, M.D.     Date: 06/27/2011  Rate: 137  Rhythm: atrial fibrillation  QRS Axis: left  Intervals: normal  ST/T Wave abnormalities: normal  Conduction  Disutrbances:none  Narrative Interpretation: Afib with RVR  Old EKG Reviewed: changes noted   1. Atrial fibrillation       MDM  77 yo female with afib with RVR for past 3 hours. No hypotension or acute distress. Otherwise asymptomatic. Troponin negative. HR down to 110s with stable BP after two dilt boluses and increased in dilt gtt to 10. No ischemic EKG changes to suggest ACS. Discussed with [facility], who will admit patient to their service for afib w/ RVR.        Sherwood Gambler, MD 06/27/11 2340

## 2011-06-27 NOTE — ED Notes (Signed)
Cardizem drip started at 5mg /h.  10mg  cardizem bolus given from bag.  Pt tolerated this well.

## 2011-06-28 ENCOUNTER — Other Ambulatory Visit: Payer: Self-pay

## 2011-06-28 DIAGNOSIS — I059 Rheumatic mitral valve disease, unspecified: Secondary | ICD-10-CM

## 2011-06-28 LAB — GLUCOSE, CAPILLARY
Glucose-Capillary: 119 mg/dL — ABNORMAL HIGH (ref 70–99)
Glucose-Capillary: 132 mg/dL — ABNORMAL HIGH (ref 70–99)

## 2011-06-28 LAB — CBC
HCT: 39.5 % (ref 36.0–46.0)
Hemoglobin: 13.3 g/dL (ref 12.0–15.0)
RDW: 13.8 % (ref 11.5–15.5)
WBC: 9.4 10*3/uL (ref 4.0–10.5)

## 2011-06-28 LAB — BASIC METABOLIC PANEL
Chloride: 105 mEq/L (ref 96–112)
Creatinine, Ser: 0.64 mg/dL (ref 0.50–1.10)
GFR calc Af Amer: >90 mL/min (ref >90)
Potassium: 3.8 mEq/L (ref 3.5–5.1)

## 2011-06-28 LAB — CARDIAC PANEL(CRET KIN+CKTOT+MB+TROPI)
CK, MB: 2.5 ng/mL (ref 0.3–4.0)
Relative Index: INVALID (ref 0.0–2.5)
Total CK: 62 U/L (ref 7–177)
Total CK: 63 U/L (ref 7–177)

## 2011-06-28 LAB — LIPID PANEL
Cholesterol: 162 mg/dL (ref 0–200)
HDL: 63 mg/dL (ref >39)
LDL Cholesterol: 85 mg/dL (ref 0–99)
Triglycerides: 69 mg/dL (ref <150)

## 2011-06-28 LAB — TSH: TSH: 2.137 u[IU]/mL (ref 0.350–4.500)

## 2011-06-28 MED ORDER — DRONEDARONE HCL 400 MG PO TABS: 400.0000 mg | ORAL_TABLET | Freq: Two times a day (BID) | ORAL | Status: DC

## 2011-06-28 MED ORDER — NON FORMULARY: 400.0000 mg | Freq: Two times a day (BID) | Status: DC

## 2011-06-28 MED ORDER — DRONEDARONE HCL 400 MG PO TABS
400.0000 mg | ORAL_TABLET | Freq: Two times a day (BID) | ORAL | Status: DC
  Administered 2011-06-28 (×2): 400 mg via ORAL
  Filled 2011-06-28 (×3): qty 1

## 2011-06-28 MED ORDER — RIVAROXABAN 10 MG PO TABS
20.0000 mg | ORAL_TABLET | Freq: Every day | ORAL | Status: DC
  Administered 2011-06-28: 20 mg via ORAL
  Filled 2011-06-28 (×2): qty 2

## 2011-06-28 MED ORDER — RIVAROXABAN 20 MG PO TABS: 20.0000 mg | ORAL_TABLET | Freq: Every day | ORAL | Status: DC

## 2011-06-28 NOTE — Discharge Instructions (Signed)
  Stress test You have a Stress Test scheduled on  4/2 at 11:45 am at [facility]  No food/drink after midnight before. No caffeine/decaf products 24hr before, including meds such as Excedrin or Goody Powders. Call if there are any questions. OK to take am meds with a sip of water. Arrive about 15 min early for paperwork. Wear comfortable clothes and shoes. Do NOT take: Beta blockers such as metoprolol/lopressor/Toprol XL or calcium channel blockers such as cardizem/Diltiazem or verapmil/Calan for 24 hours before the test.  Remove nitroglycerin patches and do not take nitrate preparations such as Imdur/isosorbide. No Persantine/Theophylline or Aggrenox meds should be used within 24 hours of the test. Discuss with MD what to do about diabetes meds if you take these.  When you arrive in the lab, the technician will inject a small amount of radioactive tracer. After a waiting period, resting pictures will be obtained.   You will be prepped for the stress portion of the test. With the stress (medical or treadmill), another small amount of radioactive tracer will be injected.  You will get a second set of pictures after a waiting period.   The whole test will take several hours.

## 2011-06-28 NOTE — Progress Notes (Signed)
Patient ID: Emily Andrews, female   DOB: 01/30/1935, 77 y.o.   MRN: [id number]    SUBJECTIVE: Patient is back in NSR this am.  Feels good.       . calcium-vitamin D  2 tablet Oral Daily  . diltiazem  10 mg Intravenous Once  . insulin aspart  0-15 Units Subcutaneous TID WC  . losartan  100 mg Oral Daily  . magnesium gluconate  500 mg Oral Daily  . metoprolol  50 mg Oral BID  . mulitivitamin with minerals  1 tablet Oral Daily  . NON FORMULARY 400 mg  400 mg Oral BID  . omega-3 acid ethyl esters  2 g Oral Daily  . pantoprazole  40 mg Oral Q1200  . rivaroxaban  20 mg Oral Daily  . cyanocobalamin  100 mcg Oral Daily  . DISCONTD: Calcium Carbonate-Vitamin D  2 tablet Oral Daily  . DISCONTD: CENTRUM  1 tablet Oral Daily  . DISCONTD: dabigatran  150 mg Oral Q12H  . DISCONTD: fish oil-omega-3 fatty acids  2 g Oral Daily      Filed Vitals:   06/27/11 2254 06/28/11 0100 06/28/11 0503 06/28/11 0716  BP:   114/60   Pulse: 67  54 65  Temp:   97.6 F (36.4 C)   TempSrc:   Oral   Resp:   18   Height:      Weight:  160 lb 0.9 oz (72.6 kg)    SpO2:   92%    No intake or output data in the 24 hours ending 06/28/11 0817  LABS: Basic Metabolic Panel:  Basename 06/28/11 0210 06/27/11 2029 06/27/11 1455  NA 141 -- 141  K 3.8 -- 3.7  CL 105 -- 104  CO2 26 -- 25  GLUCOSE 117* -- 119*  BUN 15 -- 17  CREATININE 0.64 -- 0.58  CALCIUM 9.5 -- 10.1  MG -- 1.6 --  PHOS -- -- --   Liver Function Tests: No results found for this basename: AST:2,ALT:2,ALKPHOS:2,BILITOT:2,PROT:2,ALBUMIN:2 in the last 72 hours No results found for this basename: LIPASE:2,AMYLASE:2 in the last 72 hours CBC:  Basename 06/28/11 0210 06/27/11 1455  WBC 9.4 8.6  NEUTROABS -- --  HGB 13.3 15.4*  HCT 39.5 44.7  MCV 91.4 91.2  PLT 207 227   Cardiac Enzymes:  Basename 06/28/11 0210 06/27/11 2029 06/27/11 1456  CKTOTAL 62 64 --  CKMB 2.5 2.6 --  CKMBINDEX -- -- --  TROPONINI <0.30 <0.30 <0.30    BNP: No components found with this basename: POCBNP:3 D-Dimer: No results found for this basename: DDIMER:2 in the last 72 hours Hemoglobin A1C: No results found for this basename: HGBA1C in the last 72 hours Fasting Lipid Panel:  Basename 06/28/11 0210  CHOL 162  HDL 63  LDLCALC 85  TRIG 69  CHOLHDL 2.6  LDLDIRECT --   Thyroid Function Tests:  Basename 06/27/11 2029  TSH 2.137  T4TOTAL --  T3FREE --  THYROIDAB --   Anemia Panel: No results found for this basename: VITAMINB12,FOLATE,FERRITIN,TIBC,IRON,RETICCTPCT in the last 72 hours  RADIOLOGY: Dg Chest Portable 1 View  06/27/2011  *RADIOLOGY REPORT*  Clinical Data: Palpitations.  PORTABLE CHEST - 1 VIEW  Comparison: None  Findings: The cardiac silhouette, mediastinal and hilar contours are within normal limits.  The lungs are clear. Low lung volumes with mild vascular crowding and streaky basilar atelectasis.  No pleural effusion. The bony thorax is intact.  IMPRESSION: Low lung volumes with vascular crowding and bibasilar atelectasis.  Original Report Authenticated By: P. Kalman Jewels, M.D.    PHYSICAL EXAM General: NAD Neck: No JVD, no thyromegaly or thyroid nodule.  Lungs: Slight crackles at bases clear with deep breathing CV: Nondisplaced PMI.  Heart regular S1/S2, no S3/S4, no murmur.  No peripheral edema.  No carotid bruit.  Normal pedal pulses.  Abdomen: Soft, nontender, no hepatosplenomegaly, no distention.  Neurologic: Alert and oriented x 3.  Psych: Normal affect. Extremities: No clubbing or cyanosis.   TELEMETRY: Reviewed telemetry pt in NSR  ASSESSMENT AND PLAN:  77 yo with PAF, HTN, and hyperlipidemia presented with atrial fibrillation/RVR.  She had some chest discomfort along with severe tachypalpitations.  She is back in NSR today (went back to NSR yesterday).   - Can discontinue diltiazem gtt.  Continue metoprolol.  - Echo today.  - Will treat with Xarelto, CHADSVASC score = 3.  Stop ASA.  -  Given severe symptoms with hospitalization, will start her on dronedarone 400 mg bid to try to keep her in NSR.  - Ruled out for MI.  Given chest discomfort with RVR, will arrange outpatient myoview.  - If echo looks ok, may discharge this afternoon.   Loralie Champagne 06/28/2011 8:21 AM

## 2011-06-28 NOTE — Progress Notes (Signed)
Pt. Discharged 06/28/2011  5:33 PM Discharge instructions reviewed with patient/family. Patient/family verbalized understanding. All Rx's given. Questions answered as needed. Pt. Discharged to home with family/self.  Marrissa Dai, [employer]

## 2011-06-28 NOTE — Progress Notes (Signed)
UR Completed. [id number] [employer]

## 2011-06-28 NOTE — Discharge Summary (Signed)
CARDIOLOGY DISCHARGE SUMMARY   Patient ID: Kyrie Barnell MRN: [id number] DOB/AGE: [birth year] 77 y.o.  Admit date: 06/27/2011 Discharge date: 06/28/2011  Primary Discharge Diagnosis:  PAF Secondary Discharge Diagnosis:  Patient Active Problem List  Diagnoses  . Hypertension  . Atrial fibrillation  . Hypercholesterolemia  . Degenerative arthritis of cervical spine  . Elevated LFTs    Procedures: 2D echo  Hospital Course: Ms Delerme is a 77 year old female with a history of PAF. She had sudden onset of tachypalps and came to the ER where she was in rapid afib. She was started on IV Diltiazem but with only moderate improvement in her heart rate. She was started on anticoagulation and admitted.   Ms Deitering spontaneously converted to SR overnight. She was seen 3/26 by  Dr Aundra Dubin. He felt she would benefit from maintaining SR and added Dronedarone to her meds. She had an echo done which showed preserved EF and no significant valvular abnormalities. She was evaluated by Dr Aundra Dubin and considered stable for discharge, to follow up with a stress test and office visit as an outpatient.   Labs:   Lab Results  Component Value Date   WBC 9.4 06/28/2011   HGB 13.3 06/28/2011   HCT 39.5 06/28/2011   MCV 91.4 06/28/2011   PLT 207 06/28/2011    Lab 06/28/11 0210  NA 141  K 3.8  CL 105  CO2 26  BUN 15  CREATININE 0.64  CALCIUM 9.5  PROT --  BILITOT --  ALKPHOS --  ALT --  AST --  GLUCOSE 117*    Basename 06/28/11 0816 06/28/11 0210 06/27/11 2029  CKTOTAL 63 62 64  CKMB 2.4 2.5 2.6  CKMBINDEX -- -- --  TROPONINI <0.30 <0.30 <0.30   Lipid Panel     Component Value Date/Time   CHOL 162 06/28/2011 0210   TRIG 69 06/28/2011 0210   HDL 63 06/28/2011 0210   CHOLHDL 2.6 06/28/2011 0210   VLDL 14 06/28/2011 0210   LDLCALC 85 06/28/2011 0210    Basename 06/27/11 2029  INR 1.08       Radiology:  Dg Chest Portable 1 View 06/27/2011  *RADIOLOGY REPORT*  Clinical Data:  Palpitations.  PORTABLE CHEST - 1 VIEW  Comparison: None  Findings: The cardiac silhouette, mediastinal and hilar contours are within normal limits.  The lungs are clear. Low lung volumes with mild vascular crowding and streaky basilar atelectasis.  No pleural effusion. The bony thorax is intact.  IMPRESSION: Low lung volumes with vascular crowding and bibasilar atelectasis.  Original Report Authenticated By: P. Kalman Jewels, M.D.    EKG:28-Jun-2011 04:10:31  Sinus bradycardia with 1st degree A-V block Inferior infarct , age undetermined Possible Anterior infarct , age undetermined When compared with ECG of Yesterday, Sinus rhythm has replaced Atrial fibrillation Vent. rate 51 BPM PR interval 222 ms QRS duration 98 ms QT/QTc 474/436 ms P-R-T axes 23 -9 48  Echo: 06/28/2011 Study Conclusions - Left ventricle: The cavity size was normal. Wall thickness was normal. Systolic function was normal. The estimated ejection fraction was in the range of 55% to 60%. Wall motion was normal; there were no regional wall motion abnormalities. Doppler parameters are consistent with abnormal left ventricular relaxation (grade 1 diastolic dysfunction). - Mitral valve: Calcified annulus. Mild regurgitation   FOLLOW UP PLANS AND APPOINTMENTS Discharge Orders    Future Appointments: Provider: Department: Dept Phone: Center:   07/05/2011 11:45 AM [facility]  None   08/12/2011 8:30 AM Larey Dresser, [address] [phone] [id number]     Allergies  Allergen Reactions  . Celebrex (Celecoxib)   . Clindamycin/Lincomycin   . Penicillins   . Phenergan   . Robaxin   . Statins   . Adhesive (Tape) Itching and Rash    Pt allergic to monitor electrodes   Medication List  As of 06/28/2011  3:44 PM   STOP taking these medications         amLODipine 5 MG tablet      aspirin 81 MG tablet         TAKE these medications         CALTRATE 600+D PO    Take 2 tablets by mouth daily.      CENTRUM PO   Take 1 tablet by mouth daily.      cyanocobalamin 100 MCG tablet   Take 100 mcg by mouth daily.      dronedarone 400 MG tablet   Commonly known as: MULTAQ   Take 1 tablet (400 mg total) by mouth 2 (two) times daily with a meal.      fish oil-omega-3 fatty acids 1000 MG capsule   Take 2 g by mouth daily.      losartan 100 MG tablet   Commonly known as: COZAAR   Take 100 mg by mouth daily.      magnesium gluconate 500 MG tablet   Commonly known as: MAGONATE   Take 500 mg by mouth daily.      metoprolol 50 MG tablet   Commonly known as: LOPRESSOR   Take 50 mg by mouth 2 (two) times daily.      omeprazole 20 MG capsule   Commonly known as: PRILOSEC   Take 20 mg by mouth daily.      Rivaroxaban 20 MG Tabs   Take 20 mg by mouth daily with breakfast.           Follow-up Information    Follow up with FRIED, ROBERT L, MD in 2 weeks. (As needed)       Follow up with Loralie Champagne, MD. (May 10th at 8:30 am)    Contact information:   [id number] [place] [phone]          BRING ALL MEDICATIONS WITH YOU TO FOLLOW UP APPOINTMENTS  Time spent with patient to include physician time: 35 min Signed: Rosaria Ferries 06/28/2011, 3:44 PM Co-Sign MD

## 2011-06-28 NOTE — ED Provider Notes (Signed)
I saw and evaluated the patient, reviewed the resident's note and I agree with the findings and plan.  I saw the patient along with Dr. Regenia Skeeter and agree with his note, assessment, and plan.  The patient has a history of paroxysmal atrial fibrillation.  She presents today complaining of weakness, irregular heart rate.  On exam, the heart was irregularly irregular and the lungs were clear.  The abdominal exam was unremarkable and there was no edema.  She was given a bolus of cardizem followed by a drip.  This did not lower her heart rate much.  The workup was otherwise unremarkable.  She will be admitted to the cardiology service for treatment of her arrhythmia.    Veryl Speak, MD 06/28/11 1309

## 2011-06-28 NOTE — Progress Notes (Signed)
  Echocardiogram 2D Echocardiogram has been performed.  Emily Andrews A 06/28/2011, 10:21 AM

## 2011-06-29 ENCOUNTER — Other Ambulatory Visit (HOSPITAL_COMMUNITY): Payer: Self-pay | Admitting: Cardiology

## 2011-06-29 DIAGNOSIS — R079 Chest pain, unspecified: Secondary | ICD-10-CM

## 2011-06-30 ENCOUNTER — Telehealth: Payer: Self-pay | Admitting: Cardiology

## 2011-06-30 NOTE — Telephone Encounter (Signed)
Spoke with pt. She had questions about myoview scheduled for 07/05/11.

## 2011-06-30 NOTE — Telephone Encounter (Signed)
Please return call to patient @ [phone] concerning stress test questions and medication concerns

## 2011-07-03 ENCOUNTER — Telehealth: Payer: Self-pay | Admitting: Physician Assistant

## 2011-07-03 NOTE — Telephone Encounter (Signed)
Emily Andrews called in because she has been experiencing nausea/vomiting and diarrhea. Immodium has not been helping. We are seeing a surge of similar symptoms in the community of viral gastroenteritis. Otherwise she feels okay at present - no CP, palpitations, presyncope. I advised continued supportive care with bland diet this afternoon including bananas, rice, apples along with oral rehydration. She may ask her pharmacist about adult formulations including Oralyte to prevent electrolyte loss. With risk of prolonged infection/complications I told her to stop immodium. I advised against tea, coffee or caffeinated drinks. I also recommended she seek medical care in an urgent care/ER setting this afternoon/evening if she starts to feel worse given hx of significant medical issues/age. She verbalized understanding and gratitude.

## 2011-07-04 ENCOUNTER — Emergency Department (HOSPITAL_BASED_OUTPATIENT_CLINIC_OR_DEPARTMENT_OTHER)
Admission: EM | Admit: 2011-07-04 | Discharge: 2011-07-04 | Disposition: A | Discharge status: Home / Self Care | Payer: Medicare Other | Attending: Emergency Medicine | Admitting: Emergency Medicine

## 2011-07-04 ENCOUNTER — Other Ambulatory Visit: Payer: Self-pay

## 2011-07-04 ENCOUNTER — Encounter (HOSPITAL_BASED_OUTPATIENT_CLINIC_OR_DEPARTMENT_OTHER): Payer: Self-pay | Admitting: *Deleted

## 2011-07-04 ENCOUNTER — Emergency Department (INDEPENDENT_AMBULATORY_CARE_PROVIDER_SITE_OTHER): Payer: Medicare Other

## 2011-07-04 DIAGNOSIS — M4120 Other idiopathic scoliosis, site unspecified: Secondary | ICD-10-CM

## 2011-07-04 DIAGNOSIS — K5289 Other specified noninfective gastroenteritis and colitis: Secondary | ICD-10-CM | POA: Insufficient documentation

## 2011-07-04 DIAGNOSIS — E785 Hyperlipidemia, unspecified: Secondary | ICD-10-CM | POA: Insufficient documentation

## 2011-07-04 DIAGNOSIS — K529 Noninfective gastroenteritis and colitis, unspecified: Secondary | ICD-10-CM

## 2011-07-04 DIAGNOSIS — E119 Type 2 diabetes mellitus without complications: Secondary | ICD-10-CM | POA: Insufficient documentation

## 2011-07-04 DIAGNOSIS — R5383 Other fatigue: Secondary | ICD-10-CM | POA: Insufficient documentation

## 2011-07-04 DIAGNOSIS — R109 Unspecified abdominal pain: Secondary | ICD-10-CM | POA: Insufficient documentation

## 2011-07-04 DIAGNOSIS — R5381 Other malaise: Secondary | ICD-10-CM | POA: Insufficient documentation

## 2011-07-04 LAB — DIFFERENTIAL
Basophils Relative: 0 % (ref 0–1)
Eosinophils Relative: 3 % (ref 0–5)
Lymphs Abs: 4.2 10*3/uL — ABNORMAL HIGH (ref 0.7–4.0)
Monocytes Absolute: 0.8 10*3/uL (ref 0.1–1.0)

## 2011-07-04 LAB — CBC
HCT: 42.3 % (ref 36.0–46.0)
Hemoglobin: 14.8 g/dL (ref 12.0–15.0)
MCH: 31.3 pg (ref 26.0–34.0)
MCV: 89.4 fL (ref 78.0–100.0)
RBC: 4.73 MIL/uL (ref 3.87–5.11)

## 2011-07-04 LAB — COMPREHENSIVE METABOLIC PANEL
AST: 38 U/L — ABNORMAL HIGH (ref 0–37)
Alkaline Phosphatase: 58 U/L (ref 39–117)
BUN: 12 mg/dL (ref 6–23)
CO2: 25 mEq/L (ref 19–32)
Chloride: 102 mEq/L (ref 96–112)
Creatinine, Ser: 0.7 mg/dL (ref 0.50–1.10)
GFR calc non Af Amer: 83 mL/min — ABNORMAL LOW (ref >90)
Total Bilirubin: 1.3 mg/dL — ABNORMAL HIGH (ref 0.3–1.2)

## 2011-07-04 LAB — PROTIME-INR: Prothrombin Time: 17.7 seconds — ABNORMAL HIGH (ref 11.6–15.2)

## 2011-07-04 LAB — TROPONIN I: Troponin I: <0.3 ng/mL (ref <0.30)

## 2011-07-04 LAB — LIPASE, BLOOD: Lipase: 28 U/L (ref 11–59)

## 2011-07-04 MED ORDER — ONDANSETRON HCL 4 MG/2ML IJ SOLN
4.0000 mg | Freq: Once | INTRAMUSCULAR | Status: AC
  Administered 2011-07-04: 4 mg via INTRAVENOUS
  Filled 2011-07-04: qty 2

## 2011-07-04 MED ORDER — SODIUM CHLORIDE 0.9 % IV BOLUS (SEPSIS)
500.0000 mL | Freq: Once | INTRAVENOUS | Status: AC
  Administered 2011-07-04: 500 mL via INTRAVENOUS

## 2011-07-04 MED ORDER — ONDANSETRON HCL 4 MG PO TABS: 4.0000 mg | ORAL_TABLET | Freq: Four times a day (QID) | ORAL | Status: AC

## 2011-07-04 MED ORDER — SODIUM CHLORIDE 0.9 % IV BOLUS (SEPSIS): 500.0000 mL | Freq: Once | INTRAVENOUS | Status: DC

## 2011-07-04 MED ORDER — DIPHENOXYLATE-ATROPINE 2.5-0.025 MG PO TABS: 1.0000 | ORAL_TABLET | Freq: Four times a day (QID) | ORAL | Status: AC | PRN

## 2011-07-04 NOTE — Discharge Instructions (Signed)
B.R.A.T. Diet Your doctor has recommended the B.R.A.T. diet for you or your child until the condition improves. This is often used to help control diarrhea and vomiting symptoms. If you or your child can tolerate clear liquids, you may have:  Bananas.   Rice.   Applesauce.   Toast (and other simple starches such as crackers, potatoes, noodles).  Be sure to avoid dairy products, meats, and fatty foods until symptoms are better. Fruit juices such as apple, grape, and prune juice can make diarrhea worse. Avoid these. Continue this diet for 2 days or as instructed by your caregiver. Document Released: 03/21/2005 Document Revised: 03/10/2011 Document Reviewed: 09/07/2006 [facility] Patient Information 2012 [place], [place].Viral Gastroenteritis Viral gastroenteritis is also called stomach flu. This illness is caused by a certain type of germ (virus). It can cause sudden watery poop (diarrhea) and throwing up (vomiting). This can cause you to lose body fluids (dehydration). This illness usually lasts for 3 to 8 days. It usually goes away on its own. HOME CARE   Drink enough fluids to keep your pee (urine) clear or pale yellow. Drink small amounts of fluids often.   Ask your doctor how to replace body fluid losses (rehydration).   Avoid:   Foods high in sugar.   Alcohol.   Bubbly (carbonated) drinks.   Tobacco.   Juice.   Caffeine drinks.   Very hot or cold fluids.   Fatty, greasy foods.   Eating too much at one time.   Dairy products until 24 to 48 hours after your watery poop stops.   You may eat foods with active cultures (probiotics). They can be found in some yogurts and supplements.   Wash your hands well to avoid spreading the illness.   Only take medicines as told by your doctor. Do not give aspirin to children. Do not take medicines for watery poop (antidiarrheals).   Ask your doctor if you should keep taking your regular medicines.   Keep all doctor visits as told.    GET HELP RIGHT AWAY IF:   You cannot keep fluids down.   You do not pee at least once every 6 to 8 hours.   You are short of breath.   You see blood in your poop or throw up. This may look like coffee grounds.   You have belly (abdominal) pain that gets worse or is just in one small spot (localized).   You keep throwing up or having watery poop.   You have a fever.   The patient is a child younger than 3 months, and he or she has a fever.   The patient is a child older than 3 months, and he or she has a fever and problems that do not go away.   The patient is a child older than 3 months, and he or she has a fever and problems that suddenly get worse.   The patient is a baby, and he or she has no tears when crying.  MAKE SURE YOU:   Understand these instructions.   Will watch your condition.   Will get help right away if you are not doing well or get worse.  Document Released: 09/07/2007 Document Revised: 03/10/2011 Document Reviewed: 01/05/2011 [facility] Patient Information 2012 [place], [place].

## 2011-07-04 NOTE — ED Notes (Signed)
Pt states that she has had a stomach virus since Thursday pt reports weakness and N/V/D states that she was recently [facility] from hospital for irregular HR

## 2011-07-04 NOTE — ED Provider Notes (Signed)
History     CSN: [id number]  Arrival date & time 07/04/11  0557   First MD Initiated Contact with Patient 07/04/11 [phone]      Chief Complaint  Patient presents with  . Nausea  . Emesis  . Diarrhea    (Consider location/radiation/quality/duration/timing/severity/associated sxs/prior treatment) Patient is a 77 y.o. female presenting with vomiting and diarrhea. The history is provided by the patient.  Emesis  The current episode started more than 2 days ago. Associated symptoms include abdominal pain and diarrhea. Pertinent negatives include no chills, no fever, no headaches and no myalgias.  Diarrhea The primary symptoms include fatigue, abdominal pain, nausea, vomiting and diarrhea. Primary symptoms do not include fever, dysuria, myalgias or rash.  The illness does not include chills or back pain.  Pt has had N/V/D x 4 days with progressive generalized weakness. No blood in stool. Recently hospitalized for afib with RVR. Mild diffuse abd pain. No fever, chills, urinary symptoms  Past Medical History  Diagnosis Date  . Environmental allergies   . Hiatal hernia   . Arthritis     a. R>L knees  . Diabetes mellitus   . Anxiety   . Paroxysmal atrial fibrillation     a. Dx in 2004.  Off coumadin x several yrs;  b. Normal Event Monitor 03/2011.  Marland Kitchen Hyperlipidemia     a. statin intolerant - myalgias    Past Surgical History  Procedure Date  . Cholecystectomy   . Abdominal hysterectomy   . Lipoma tumor removed     right shoulder  . Cystectomy     lower back    Family History  Problem Relation Age of Onset  . Liver disease Mother     cirrhosis - died in 42's  . Heart failure Mother   . Stroke Father     died in 49's  . Coronary artery disease Sister     living  . Coronary artery disease Brother     living    History  Substance Use Topics  . Smoking status: Never Smoker   . Smokeless tobacco: Never Used  . Alcohol Use: No    OB History    Grav Para Term Preterm  Abortions TAB SAB Ect Mult Living                  Review of Systems  Constitutional: Positive for fatigue. Negative for fever and chills.  HENT: Negative for neck pain.   Respiratory: Negative for chest tightness and shortness of breath.   Cardiovascular: Negative for chest pain, palpitations and leg swelling.  Gastrointestinal: Positive for nausea, vomiting, abdominal pain and diarrhea. Negative for blood in stool.  Genitourinary: Negative for dysuria and flank pain.  Musculoskeletal: Negative for myalgias and back pain.  Skin: Negative for rash.  Neurological: Positive for weakness. Negative for numbness and headaches.    Allergies  Celebrex; Clindamycin/lincomycin; Penicillins; Phenergan; Robaxin; Statins; and Adhesive  Home Medications   Current Outpatient Rx  Name Route Sig Dispense Refill  . CALTRATE 600+D PO Oral Take 2 tablets by mouth daily.     . CYANOCOBALAMIN 100 MCG PO TABS Oral Take 100 mcg by mouth daily.      Marland Kitchen DIPHENOXYLATE-ATROPINE 2.5-0.025 MG PO TABS Oral Take 1 tablet by mouth 4 (four) times daily as needed for diarrhea or loose stools. 30 tablet 0  . DRONEDARONE HCL 400 MG PO TABS Oral Take 1 tablet (400 mg total) by mouth 2 (two) times daily with a meal.  60 tablet 11  . OMEGA-3 FATTY ACIDS 1000 MG PO CAPS Oral Take 2 g by mouth daily.     Marland Kitchen LOSARTAN POTASSIUM 100 MG PO TABS Oral Take 100 mg by mouth daily.    Marland Kitchen MAGNESIUM GLUCONATE 500 MG PO TABS Oral Take 500 mg by mouth daily.      Marland Kitchen METOPROLOL TARTRATE 50 MG PO TABS Oral Take 50 mg by mouth 2 (two) times daily.    . CENTRUM PO Oral Take 1 tablet by mouth daily.      Marland Kitchen OMEPRAZOLE 20 MG PO CPDR Oral Take 20 mg by mouth daily.      Marland Kitchen ONDANSETRON HCL 4 MG PO TABS Oral Take 1 tablet (4 mg total) by mouth every 6 (six) hours. 12 tablet 0  . RIVAROXABAN 20 MG PO TABS Oral Take 20 mg by mouth daily with breakfast. 30 tablet 11    BP 130/62  Pulse 64  Temp(Src) 98.3 F (36.8 C) (Oral)  Resp 20  SpO2  99%  Physical Exam  Nursing note and vitals reviewed. Constitutional: She is oriented to person, place, and time. She appears well-developed and well-nourished. No distress.  HENT:  Head: Normocephalic and atraumatic.  Mouth/Throat: Oropharynx is clear and moist.  Eyes: EOM are normal. Pupils are equal, round, and reactive to light.  Neck: Normal range of motion. Neck supple.  Cardiovascular: Normal rate and regular rhythm.   Pulmonary/Chest: Effort normal and breath sounds normal. No respiratory distress. She has no wheezes. She has no rales.  Abdominal: Soft. Bowel sounds are normal. There is tenderness (mild generalized ttp. no rebound or guarding ). There is no rebound and no guarding.  Musculoskeletal: Normal range of motion. She exhibits no edema and no tenderness.  Neurological: She is alert and oriented to person, place, and time.       5/5 motor, sensation intact  Skin: Skin is warm and dry. No rash noted. No erythema.  Psychiatric: She has a normal mood and affect. Her behavior is normal.    ED Course  Procedures (including critical care time)  Labs Reviewed  DIFFERENTIAL - Abnormal; Notable for the following:    Neutrophils Relative 33 (*)    Lymphocytes Relative 54 (*)    Lymphs Abs 4.2 (*)    All other components within normal limits  COMPREHENSIVE METABOLIC PANEL - Abnormal; Notable for the following:    Glucose, Bld 105 (*)    AST 38 (*)    Total Bilirubin 1.3 (*)    GFR calc non Af Amer 83 (*)    All other components within normal limits  PROTIME-INR - Abnormal; Notable for the following:    Prothrombin Time 17.7 (*)    All other components within normal limits  CBC  LIPASE, BLOOD  APTT  TROPONIN I  CLOSTRIDIUM DIFFICILE BY PCR   No results found.   1. Gastroenteritis      Date: 07/04/2011  Rate: 53  Rhythm: normal sinus rhythm  QRS Axis: normal  Intervals: PR prolonged  ST/T Wave abnormalities: normal  Conduction Disutrbances:first-degree A-V  block   Narrative Interpretation:   Old EKG Reviewed: unchanged    MDM          Julianne Rice, MD 07/07/11 [phone]

## 2011-07-05 ENCOUNTER — Other Ambulatory Visit (HOSPITAL_COMMUNITY): Payer: Medicare Other

## 2011-07-11 ENCOUNTER — Ambulatory Visit (HOSPITAL_COMMUNITY): Payer: Medicare Other | Attending: Cardiology | Admitting: Radiology

## 2011-07-11 VITALS — BP 158/80 | Ht 63.0 in | Wt 146.0 lb

## 2011-07-11 DIAGNOSIS — I4891 Unspecified atrial fibrillation: Secondary | ICD-10-CM | POA: Insufficient documentation

## 2011-07-11 DIAGNOSIS — I10 Essential (primary) hypertension: Secondary | ICD-10-CM | POA: Insufficient documentation

## 2011-07-11 DIAGNOSIS — R5381 Other malaise: Secondary | ICD-10-CM | POA: Insufficient documentation

## 2011-07-11 DIAGNOSIS — E785 Hyperlipidemia, unspecified: Secondary | ICD-10-CM | POA: Insufficient documentation

## 2011-07-11 DIAGNOSIS — R000 Tachycardia, unspecified: Secondary | ICD-10-CM | POA: Insufficient documentation

## 2011-07-11 DIAGNOSIS — R079 Chest pain, unspecified: Secondary | ICD-10-CM

## 2011-07-11 DIAGNOSIS — R002 Palpitations: Secondary | ICD-10-CM | POA: Insufficient documentation

## 2011-07-11 DIAGNOSIS — R0789 Other chest pain: Secondary | ICD-10-CM | POA: Insufficient documentation

## 2011-07-11 MED ORDER — TECHNETIUM TC 99M TETROFOSMIN IV KIT
10.0000 | PACK | Freq: Once | INTRAVENOUS | Status: AC | PRN
  Administered 2011-07-11: 10 via INTRAVENOUS

## 2011-07-11 MED ORDER — REGADENOSON 0.4 MG/5ML IV SOLN
0.4000 mg | Freq: Once | INTRAVENOUS | Status: AC
  Administered 2011-07-11: 0.4 mg via INTRAVENOUS

## 2011-07-11 MED ORDER — TECHNETIUM TC 99M TETROFOSMIN IV KIT
30.0000 | PACK | Freq: Once | INTRAVENOUS | Status: AC | PRN
  Administered 2011-07-11: 30 via INTRAVENOUS

## 2011-07-11 NOTE — Progress Notes (Addendum)
[facility] [place] [address] [phone]  Cardiology Nuclear Med Study  [name] is a 77 y.o. female     MRN : [id number]     DOB: 1934/05/25  Procedure Date: 07/11/2011  Nuclear Med Background Indication for Stress Test:  Evaluation for Ischemia and [facility] on 06/27/11 with recurrent Afib with RVR and  chest tightness with (-) enzymes History:  PAF; '04 [id number], EF=79%; 06/28/11 Echo:Normal Cardiac Risk Factors: Hypertension and Lipids  Symptoms:  Chest Tightness only with Atrial Fibrillation (last episode of chest discomfort:none since discharge), Fatigue with Exertion, Palpitations and Rapid HR   Nuclear Pre-Procedure Caffeine/Decaff Intake:  7:00pm NPO After: 9:00pm   Lungs:  clear O2 Sat: 99% on room air. IV 0.9% NS with Angio Cath:  20g  IV Site: R Wrist  IV Started by:  Matilde Haymaker, RN  Chest Size (in):  38 Cup Size: D  Height: 5\' 3"  (1.6 m)  Weight:  146 lb (66.225 kg)  BMI:  Body mass index is 25.86 kg/(m^2). Tech Comments:  Lopressor held x  12 hrs    Nuclear Med Study 1 or 2 day study: 1 day  Stress Test Type:  Treadmill/Lexiscan  Reading MD: Dola Argyle, MD  Order Authorizing Provider:  Loralie Champagne, MD  Resting Radionuclide: Technetium 41m Tetrofosmin  Resting Radionuclide Dose: 11.0 mCi   Stress Radionuclide:  Technetium 14m Tetrofosmin  Stress Radionuclide Dose: 33.0 mCi           Stress Protocol Rest HR: 62 Stress HR: 127  Rest BP: 158/80 Stress BP: 178/80  Exercise Time (min): 2:00 METS: n/a   Predicted Max HR: 146 bpm % Max HR: 86.99 bpm Rate Pressure Product: 22606   Dose of Adenosine (mg):  n/a Dose of Lexiscan: 0.4 mg  Dose of Atropine (mg): n/a Dose of Dobutamine: n/a mcg/kg/min (at max HR)  Stress Test Technologist: Letta Moynahan, CMA-N  Nuclear Technologist:  Charlton Amor, CNMT     Rest Procedure:  Myocardial perfusion imaging was performed at  rest 45 minutes following the intravenous administration of Technetium 37m Tetrofosmin.  Rest ECG: Questionable ASWMI.  Stress Procedure:  The patient received IV Lexiscan 0.4 mg over 15-seconds with concurrent low level exercise and then Technetium 49m Tetrofosmin was injected at 30-seconds while the patient continued walking one more minute. There were minor nonspecific T-wave changes and rare PVC's with Lexiscan. Quantitative spect images were obtained after a 45-minute delay.  Stress ECG: No significant change from baseline ECG  QPS Raw Data Images:  Patient motion noted; appropriate software correction applied. Stress Images:  There is mild decreased activity at the apex consistent with apical thinning. Rest Images:  The rest images are similar to the stress images. Subtraction (SDS):  No evidence of ischemia. Transient Ischemic Dilatation (Normal <1.22):  1.01 Lung/Heart Ratio (Normal <0.45):  0.14  Quantitative Gated Spect Images QGS EDV:  67 ml QGS ESV:  15 ml  Impression Exercise Capacity:  Lexiscan with low level exercise. BP Response:  Normal blood pressure response. Clinical Symptoms:  No significant symptoms noted. ECG Impression:  No significant ST segment change suggestive of ischemia. Comparison with Prior Nuclear Study: No images to compare  Overall Impression:  Normal stress nuclear study.  LV Ejection Fraction: 78%.  LV Wall Motion:  Normal Wall Motion  Dola Argyle, MD   Normal stress myoview.   Loralie Champagne 07/12/2011

## 2011-07-14 ENCOUNTER — Telehealth: Payer: Self-pay | Admitting: Cardiology

## 2011-07-14 NOTE — Progress Notes (Signed)
LMTCB

## 2011-07-14 NOTE — Telephone Encounter (Signed)
Spoke with pt. Pt states she was prescribed both multaq and xarelto when discharged from hospital 06/28/11. Pt states multaq will cost $478 and xarelto will cost $316 when she needs her  refill(next month) because she will be in the doughnut hole. She  will have to pay the entire expense out of pocket until she spends $4000. After she spends $4000 out of pocket she will be responsible for 25% of the cost of the medication. She states she cannot afford to refill the medication. Pt states it is not a matter of prior authorization.  She is requesting a less expensive alternative to both medications.  I will forward to Dr Aundra Dubin for review

## 2011-07-14 NOTE — Telephone Encounter (Signed)
Coumadin would be alternative for Xarelto and flecainide would be alternative for dronedarone.  She can stop dronedarone and then start flecainide 50 mg bid (need to make sure she is on a beta blocker or diltiazem also).  She will have to get a stress myoview to be able to take flecainide (to keep her in NSR).  She can convert from Xarelto to coumadin.  Ask Gay Filler about our current policy for converting from Xarelto to coumadin, but labeling says to start Lovenox and coumadin 1 day after Xarelto is stopped with overlap until INR > 2.  Would check with Gay Filler to see if it would be acceptable not to do the overlap. Would check with the companies that make dronedarone and Xarelto to see if she could get patient assistance.

## 2011-07-14 NOTE — Progress Notes (Signed)
Spoke with pt

## 2011-07-14 NOTE — Telephone Encounter (Signed)
Fu call °Patient returning your call °

## 2011-07-15 NOTE — Telephone Encounter (Signed)
Fu call °Patient returning your call °

## 2011-07-15 NOTE — Telephone Encounter (Signed)
Spoke with pt. She is very hesitant about doing the Lovenox bridge to Coumadin due to cost.  Discussed increased risk of stroke if she just transitioned from Xarelto to Coumadin.  She is going to call the pharmacy to find out the cost of Lovenox.  May be more cost effective for her to remain on Xarelto rather than pay for Lovenox.  She has enough Xarelto to last until 4/25.  Will call us and let us know what her decision is.   Her previous dose of Coumadn in 2009 was 5mg  3 days of the week and 2.5mg  4 days of the week.

## 2011-07-15 NOTE — Telephone Encounter (Signed)
Spoke with pt. Pt will find out the cost of flecainide and coumadin. She will  call me back to let me know her decision about changing medication.

## 2011-07-19 NOTE — Telephone Encounter (Signed)
Follow up from previous call:  Patient calling  no information was given would like the nurse to call her back.

## 2011-07-19 NOTE — Telephone Encounter (Signed)
Spoke with pt. She states lovenox will cost $382.08 but she still wants to change from Xarelto to coumadin. I will review with Dr Aundra Dubin then plan to follow-up with pt in the next 1-2 days.

## 2011-07-21 ENCOUNTER — Encounter: Payer: Self-pay | Admitting: Cardiology

## 2011-07-21 MED ORDER — FLECAINIDE ACETATE 50 MG PO TABS: 50.0000 mg | ORAL_TABLET | Freq: Two times a day (BID) | ORAL | Status: DC

## 2011-07-21 NOTE — Telephone Encounter (Signed)
Spoke with pt.  She will come to the coumadin clinic on 4/25 to discuss switch from Xarelto to Coumadin.

## 2011-07-21 NOTE — Telephone Encounter (Signed)
Spoke with pt. She will start flecainide 50mg  bid when she finishes her current supply of multaq.  She is taking metoprolol currently. I will forward this to Gay Filler in CV[facility] to follow-up with pt about changing from xarelto to coumadin.

## 2011-07-21 NOTE — Telephone Encounter (Signed)
This encounter was created in error - please disregard.

## 2011-07-21 NOTE — Telephone Encounter (Signed)
New Problem:      Patient called in wanting to speak with you about receving a shot. Please call back.

## 2011-07-27 ENCOUNTER — Ambulatory Visit (INDEPENDENT_AMBULATORY_CARE_PROVIDER_SITE_OTHER): Payer: Medicare Other | Admitting: Pharmacist

## 2011-07-27 DIAGNOSIS — I4891 Unspecified atrial fibrillation: Secondary | ICD-10-CM

## 2011-07-27 DIAGNOSIS — Z7901 Long term (current) use of anticoagulants: Secondary | ICD-10-CM | POA: Insufficient documentation

## 2011-07-27 DIAGNOSIS — R0989 Other specified symptoms and signs involving the circulatory and respiratory systems: Secondary | ICD-10-CM

## 2011-07-27 MED ORDER — ENOXAPARIN SODIUM 100 MG/ML ~~LOC~~ SOLN: 100.0000 mg | Freq: Every day | SUBCUTANEOUS | Status: DC

## 2011-07-27 MED ORDER — WARFARIN SODIUM 5 MG PO TABS: 5.0000 mg | ORAL_TABLET | Freq: Every day | ORAL | Status: DC

## 2011-07-27 NOTE — Patient Instructions (Signed)
4/25- Take last dose of Xarelto 4/26- Start Lovenox 100mg  injection in PM and Coumadin 5mg  in PM 4/27- Lovenox 100mg  injection in PM and Coumadin 5mg  in PM 4/28- Lovenox 100mg  injection in PM and Coumadin 5mg  in PM 4/29- Lovenox 100mg  injection in PM and Coumadin 5mg  in PM 4/30- Lovenox 100mg  injection in PM and Coumadin 5mg  in PM 5/1- Coumadin 5mg  in PM 5/2- Coumadin 5mg  in PM 5/3- Recheck INR

## 2011-07-28 ENCOUNTER — Telehealth: Payer: Self-pay | Admitting: Cardiology

## 2011-07-28 NOTE — Telephone Encounter (Signed)
Called spoke with pt clarified Lovenox and Coumadin start tomorrow, last dose of Xarelto taken today.

## 2011-07-28 NOTE — Telephone Encounter (Signed)
New Problem:     Patient called in wanting to speak with someone about her medication. Please call back, just not between 10 and 10:30am because she will be in church.

## 2011-08-05 ENCOUNTER — Ambulatory Visit (INDEPENDENT_AMBULATORY_CARE_PROVIDER_SITE_OTHER): Payer: Medicare Other | Admitting: Pharmacist

## 2011-08-05 DIAGNOSIS — I4891 Unspecified atrial fibrillation: Secondary | ICD-10-CM

## 2011-08-05 DIAGNOSIS — Z7901 Long term (current) use of anticoagulants: Secondary | ICD-10-CM

## 2011-08-12 ENCOUNTER — Encounter: Payer: Self-pay | Admitting: Cardiology

## 2011-08-12 ENCOUNTER — Ambulatory Visit (INDEPENDENT_AMBULATORY_CARE_PROVIDER_SITE_OTHER): Payer: Medicare Other | Admitting: Cardiology

## 2011-08-12 ENCOUNTER — Ambulatory Visit (INDEPENDENT_AMBULATORY_CARE_PROVIDER_SITE_OTHER): Payer: Medicare Other | Admitting: *Deleted

## 2011-08-12 VITALS — BP 144/82 | HR 48 | Ht 63.0 in | Wt 159.8 lb

## 2011-08-12 DIAGNOSIS — Z7901 Long term (current) use of anticoagulants: Secondary | ICD-10-CM

## 2011-08-12 DIAGNOSIS — E7800 Pure hypercholesterolemia, unspecified: Secondary | ICD-10-CM

## 2011-08-12 DIAGNOSIS — I4891 Unspecified atrial fibrillation: Secondary | ICD-10-CM

## 2011-08-12 LAB — POCT INR: INR: 3.4

## 2011-08-12 NOTE — Patient Instructions (Addendum)
Decrease metoprolol (lopressor)  to 25mg  twice a day. You can take one-half 50mg  tablet twice a day.  Dr Maceo Pro will see you today at 11:15am.  Your physician recommends that you schedule a follow-up appointment in: 3 months with Dr Aundra Dubin.

## 2011-08-12 NOTE — Assessment & Plan Note (Signed)
No symptomatic atrial fibrillation since starting flecainide.  No evidence for ischemia on myoview (done due to flecainide use to screen out CAD).  Very rarely, flecainide can cause diarrhea.  However, her diarrhea predates flecainide use and her husband has had the same thing.  ? Norovirus.  She will followup with her PCP for evaluation.  If the diarrhea does not abate in the next week or so, could do a trial off flecainide.  She will continue coumadin.  Given bradycardia today and generalized weakness, I will decrease metoprolol to 25 mg bid (she needs to continue some nodal blockade given use of flecainide).

## 2011-08-12 NOTE — Progress Notes (Signed)
PCP: Dr. Maceo Pro  77 yo with history of paroxysmal atrial fibrillation presents for cardiology followup.  She was admitted in 3/13 with very symptomatic atrial fibrillation/RVR.  She went back into NSR on diltiazem gtt overnight.  Initially, I started her on dronedarone and Xarelto.  These medications were both too expensive so she was transitioned to flecainide and coumadin, which she is on currently.  Main complaint today has been diarrhea that has been on and off since she was in the hospital. It predates the use of flecainide.  Her husband has had the diarrhea also (watery, no blood).  She is not short of breath with exertion but does feel weak and drained of energy.  Heart rate is 48 at rest today.  No chest pain.  No tachypalpitations.  Stress myoview was done with flecainide initiation.  This showed no evidence for MI.   ECG: Sinus brady at 48, LAE, 1st degree AV block, LVH, possible old inferior MI  Labs (5/12): K 4.2, creatinine 0.5, LDL 125, HDL 46 Labs (7/12): K 4.2, creatinine 0.5, AST 63, ALT 64, HCT 43.3 Labs (3/13): LDL 85, HDL 63, cardiac enzymes negative, K 3.8, creatinine 0.64  PMH: 1. Paroxysmal atrial fibrillation: No documented recurrence since prior to 2009.  She has not been on warfarin.  Echo (3/13): EF 55-60%, mild MR. 2. Hyperlipidemia: Myalgias and leg weakness with pravastatin (very severe symptoms).  3. HTN 4. Osteoarthritis R > L knee 5. Lexiscan myoview (4/13): EF 78%, no ischemia or infarction.   SH: Lives in [place] with husband.  Nonsmoker.    FH: No premature CAD.   ROS: All systems reviewed and negative except as per HPI.   Current Outpatient Prescriptions  Medication Sig Dispense Refill  . Calcium Carbonate-Vitamin D (CALTRATE 600+D PO) Take 2 tablets by mouth daily.       . cyanocobalamin 100 MCG tablet Take 100 mcg by mouth daily.        . fish oil-omega-3 fatty acids 1000 MG capsule Take 2 g by mouth daily.       . flecainide (TAMBOCOR) 50 MG  tablet Take 1 tablet (50 mg total) by mouth 2 (two) times daily.      Marland Kitchen losartan (COZAAR) 100 MG tablet Take 100 mg by mouth daily.      . Multiple Vitamins-Minerals (CENTRUM PO) Take 1 tablet by mouth daily.        Marland Kitchen warfarin (COUMADIN) 5 MG tablet Take 1 tablet (5 mg total) by mouth daily.  30 tablet  1  . DISCONTD: flecainide (TAMBOCOR) 50 MG tablet Take 1 tablet (50 mg total) by mouth 2 (two) times daily.  60 tablet  6  . DISCONTD: metoprolol (LOPRESSOR) 50 MG tablet Take 50 mg by mouth 2 (two) times daily.      . Magnesium 250 MG TABS Take by mouth 2 (two) times daily.    0  . metoprolol tartrate (LOPRESSOR) 25 MG tablet Take one tablet twice a day--you can take one-half 50mg  tablet twice a day      . omeprazole (PRILOSEC) 20 MG capsule Take 1 capsule (20 mg total) by mouth daily.      Marland Kitchen DISCONTD: omeprazole (PRILOSEC) 20 MG capsule Take 20 mg by mouth daily.          BP 144/82  Pulse 48  Ht 5\' 3"  (1.6 m)  Wt 159 lb 12 oz (72.462 kg)  BMI 28.30 kg/m2 General: NAD Neck: No JVD, no thyromegaly or thyroid nodule.  Lungs: Clear to auscultation bilaterally with normal respiratory effort. CV: Nondisplaced PMI.  Heart regular S1/S2, no S3/S4, no murmur.  No peripheral edema.  No carotid bruit.  Normal pedal pulses. Varicosities in lower legs bilaterally.  Abdomen: Soft, nontender, no hepatosplenomegaly, no distention.  Neurologic: Alert and oriented x 3.  Psych: Normal affect. Extremities: No clubbing or cyanosis.

## 2011-08-12 NOTE — Assessment & Plan Note (Signed)
Patient has been unable to take statins due to severe muscle weakness.  Lipids were actually quite good when done in 3/13.  No known vascular disease.

## 2011-08-19 ENCOUNTER — Ambulatory Visit (INDEPENDENT_AMBULATORY_CARE_PROVIDER_SITE_OTHER): Payer: Medicare Other | Admitting: Pharmacist

## 2011-08-19 DIAGNOSIS — I4891 Unspecified atrial fibrillation: Secondary | ICD-10-CM

## 2011-08-19 DIAGNOSIS — Z7901 Long term (current) use of anticoagulants: Secondary | ICD-10-CM

## 2011-08-19 LAB — POCT INR: INR: 2.8

## 2011-08-26 ENCOUNTER — Ambulatory Visit (INDEPENDENT_AMBULATORY_CARE_PROVIDER_SITE_OTHER): Payer: Medicare Other | Admitting: Pharmacist

## 2011-08-26 DIAGNOSIS — I4891 Unspecified atrial fibrillation: Secondary | ICD-10-CM

## 2011-08-26 DIAGNOSIS — Z7901 Long term (current) use of anticoagulants: Secondary | ICD-10-CM

## 2011-08-26 LAB — POCT INR: INR: 2.3

## 2011-09-07 ENCOUNTER — Ambulatory Visit (INDEPENDENT_AMBULATORY_CARE_PROVIDER_SITE_OTHER): Payer: Medicare Other | Admitting: *Deleted

## 2011-09-07 DIAGNOSIS — I4891 Unspecified atrial fibrillation: Secondary | ICD-10-CM

## 2011-09-07 DIAGNOSIS — Z7901 Long term (current) use of anticoagulants: Secondary | ICD-10-CM

## 2011-09-07 LAB — POCT INR: INR: 2.4

## 2011-09-21 ENCOUNTER — Other Ambulatory Visit: Payer: Self-pay | Admitting: Cardiology

## 2011-09-28 ENCOUNTER — Ambulatory Visit (INDEPENDENT_AMBULATORY_CARE_PROVIDER_SITE_OTHER): Payer: Medicare Other | Admitting: *Deleted

## 2011-09-28 DIAGNOSIS — I4891 Unspecified atrial fibrillation: Secondary | ICD-10-CM

## 2011-09-28 DIAGNOSIS — Z7901 Long term (current) use of anticoagulants: Secondary | ICD-10-CM

## 2011-09-28 LAB — POCT INR: INR: 2.2

## 2011-10-26 ENCOUNTER — Ambulatory Visit (INDEPENDENT_AMBULATORY_CARE_PROVIDER_SITE_OTHER): Payer: Medicare Other | Admitting: *Deleted

## 2011-10-26 DIAGNOSIS — Z7901 Long term (current) use of anticoagulants: Secondary | ICD-10-CM

## 2011-10-26 DIAGNOSIS — I4891 Unspecified atrial fibrillation: Secondary | ICD-10-CM

## 2011-10-26 LAB — POCT INR: INR: 3

## 2011-11-11 ENCOUNTER — Ambulatory Visit (INDEPENDENT_AMBULATORY_CARE_PROVIDER_SITE_OTHER): Payer: Medicare Other | Admitting: Cardiology

## 2011-11-11 ENCOUNTER — Ambulatory Visit (INDEPENDENT_AMBULATORY_CARE_PROVIDER_SITE_OTHER): Payer: Medicare Other | Admitting: *Deleted

## 2011-11-11 ENCOUNTER — Encounter: Payer: Self-pay | Admitting: Cardiology

## 2011-11-11 VITALS — BP 136/88 | HR 63 | Ht 63.0 in | Wt 165.1 lb

## 2011-11-11 DIAGNOSIS — I4891 Unspecified atrial fibrillation: Secondary | ICD-10-CM

## 2011-11-11 DIAGNOSIS — Z7901 Long term (current) use of anticoagulants: Secondary | ICD-10-CM

## 2011-11-11 LAB — POCT INR: INR: 2.4

## 2011-11-11 NOTE — Patient Instructions (Addendum)
The current medical regimen is effective;  continue present plan and medications.  If you have palpitations that feel like your At Fib you can double your Flecainide and Metoprolol for 2 days.  If it continues please call the office.  Follow up with Dr Aundra Dubin in 3 months.

## 2011-11-13 NOTE — Assessment & Plan Note (Signed)
No symptomatic atrial fibrillation since starting flecainide.  No evidence for ischemia on myoview (done due to flecainide use to screen out CAD).  She will continue coumadin.  For now, she will continue metoprolol.  If fatigue remains an issue, could try replacing metoprolol with diltiazem.

## 2011-11-13 NOTE — Progress Notes (Signed)
Patient ID: Emily Andrews, female   DOB: 10/29/1934, 77 y.o.   MRN: [id number] PCP: Dr. Maceo Pro  77 yo with history of paroxysmal atrial fibrillation presents for cardiology followup.  She was admitted in 3/13 with very symptomatic atrial fibrillation/RVR.  She went back into NSR on diltiazem gtt overnight.  Initially, I started her on dronedarone and Xarelto.  These medications were both too expensive so she was transitioned to flecainide and coumadin, which she is on currently.  She has been doing well since last appointment.  No tachypalpitations.  Some fatigue.  She has been walking 1 mile/day with no exertional chest pain or dyspnea.   ECG: NSR, ? Old anterior MI, ? Old inferior MI  Labs (5/12): K 4.2, creatinine 0.5, LDL 125, HDL 46 Labs (7/12): K 4.2, creatinine 0.5, AST 63, ALT 64, HCT 43.3 Labs (3/13): LDL 85, HDL 63, cardiac enzymes negative, K 3.8, creatinine 0.64  PMH: 1. Paroxysmal atrial fibrillation: No documented recurrence since prior to 2009.  She has not been on warfarin.  Echo (3/13): EF 55-60%, mild MR. 2. Hyperlipidemia: Myalgias and leg weakness with pravastatin (very severe symptoms).  3. HTN 4. Osteoarthritis R > L knee 5. Lexiscan myoview (4/13): EF 78%, no ischemia or infarction.   SH: Lives in [place] with husband.  Nonsmoker.    FH: No premature CAD.   Current Outpatient Prescriptions  Medication Sig Dispense Refill  . Calcium Carbonate-Vitamin D (CALTRATE 600+D PO) Take 2 tablets by mouth daily.       . cyanocobalamin 100 MCG tablet Take 100 mcg by mouth daily.        . fish oil-omega-3 fatty acids 1000 MG capsule Take 2 g by mouth daily.       . flecainide (TAMBOCOR) 50 MG tablet Take 1 tablet (50 mg total) by mouth 2 (two) times daily.      Marland Kitchen losartan (COZAAR) 100 MG tablet Take 100 mg by mouth daily.      . Magnesium 250 MG TABS Take by mouth 2 (two) times daily.    0  . metoprolol tartrate (LOPRESSOR) 25 MG tablet Take one tablet twice a  day--you can take one-half 50mg  tablet twice a day      . Multiple Vitamins-Minerals (CENTRUM PO) Take 1 tablet by mouth daily.        Marland Kitchen omeprazole (PRILOSEC) 20 MG capsule Take 1 capsule (20 mg total) by mouth daily.      Marland Kitchen warfarin (COUMADIN) 5 MG tablet TAKE ONE TABLET BY MOUTH DAILY  30 tablet  2    BP 136/88  Pulse 63  Ht 5\' 3"  (1.6 m)  Wt 165 lb 1.9 oz (74.898 kg)  BMI 29.25 kg/m2 General: NAD Neck: No JVD, no thyromegaly or thyroid nodule.  Lungs: Clear to auscultation bilaterally with normal respiratory effort. CV: Nondisplaced PMI.  Heart regular S1/S2, no S3/S4, no murmur.  No peripheral edema.  No carotid bruit.  Normal pedal pulses. Varicosities in lower legs bilaterally.  Abdomen: Soft, nontender, no hepatosplenomegaly, no distention.  Neurologic: Alert and oriented x 3.  Psych: Normal affect. Extremities: No clubbing or cyanosis.

## 2011-12-12 ENCOUNTER — Encounter: Payer: Self-pay | Admitting: Cardiology

## 2011-12-13 ENCOUNTER — Other Ambulatory Visit: Payer: Self-pay | Admitting: Cardiology

## 2011-12-23 ENCOUNTER — Ambulatory Visit (INDEPENDENT_AMBULATORY_CARE_PROVIDER_SITE_OTHER): Payer: Medicare Other | Admitting: *Deleted

## 2011-12-23 DIAGNOSIS — Z7901 Long term (current) use of anticoagulants: Secondary | ICD-10-CM

## 2011-12-23 DIAGNOSIS — I4891 Unspecified atrial fibrillation: Secondary | ICD-10-CM

## 2012-02-08 ENCOUNTER — Ambulatory Visit (INDEPENDENT_AMBULATORY_CARE_PROVIDER_SITE_OTHER): Payer: Medicare Other | Admitting: *Deleted

## 2012-02-08 ENCOUNTER — Ambulatory Visit (INDEPENDENT_AMBULATORY_CARE_PROVIDER_SITE_OTHER): Payer: Medicare Other | Admitting: Cardiology

## 2012-02-08 ENCOUNTER — Encounter: Payer: Self-pay | Admitting: Cardiology

## 2012-02-08 VITALS — BP 169/76 | HR 59 | Ht 63.0 in | Wt 171.8 lb

## 2012-02-08 DIAGNOSIS — I4891 Unspecified atrial fibrillation: Secondary | ICD-10-CM

## 2012-02-08 DIAGNOSIS — I10 Essential (primary) hypertension: Secondary | ICD-10-CM

## 2012-02-08 DIAGNOSIS — Z7901 Long term (current) use of anticoagulants: Secondary | ICD-10-CM

## 2012-02-08 LAB — POCT INR: INR: 2.7

## 2012-02-08 MED ORDER — AMLODIPINE BESYLATE 5 MG PO TABS: 5.0000 mg | ORAL_TABLET | Freq: Every day | ORAL | Status: DC

## 2012-02-08 NOTE — Patient Instructions (Addendum)
Start amlodipine 5mg  daily.  Your physician wants you to follow-up in: 6 months with Dr Aundra Dubin. (May 2014). You will receive a reminder letter in the mail two months in advance. If you don't receive a letter, please call our office to schedule the follow-up appointment.   Your physician recommends that you return for lab work in: 6 months when you see Dr McLean--BMET/CBCd.

## 2012-02-08 NOTE — Progress Notes (Signed)
Patient ID: Emily Andrews, female   DOB: [birth year], 77 y.o.   MRN: [id number] PCP: Dr. Maceo Pro  77 yo with history of paroxysmal atrial fibrillation presents for cardiology followup.  She was admitted in 3/13 with very symptomatic atrial fibrillation/RVR.  She went back into NSR on diltiazem gtt overnight.  Initially, I started her on dronedarone and Xarelto.  These medications were both too expensive so she was transitioned to flecainide and coumadin, which she is on currently.  She has been doing well since last appointment.  No tachypalpitations.  Some fatigue.  She has been walking 1 mile/day with no exertional chest pain or dyspnea.  Her BP is 169/76 today.  She says that it is in this range when she checks it at home.   ECG: NSR, 1st degree AV block  Labs (5/12): K 4.2, creatinine 0.5, LDL 125, HDL 46 Labs (7/12): K 4.2, creatinine 0.5, AST 63, ALT 64, HCT 43.3 Labs (3/13): LDL 85, HDL 63, cardiac enzymes negative, K 3.8, creatinine 0.64  PMH: 1. Paroxysmal atrial fibrillation: No documented recurrence since prior to 2009.  She has not been on warfarin.  Echo (3/13): EF 55-60%, mild MR. 2. Hyperlipidemia: Myalgias and leg weakness with pravastatin (very severe symptoms).  3. HTN 4. Osteoarthritis R > L knee 5. Lexiscan myoview (4/13): EF 78%, no ischemia or infarction.   SH: Lives in [place] with husband.  Nonsmoker.    FH: No premature CAD.   ROS: All systems reviewed and negative except as per HPI.   Current Outpatient Prescriptions  Medication Sig Dispense Refill  . Calcium Carbonate-Vitamin D (CALTRATE 600+D PO) Take 2 tablets by mouth daily.       . cyanocobalamin 100 MCG tablet Take 100 mcg by mouth daily.        . fish oil-omega-3 fatty acids 1000 MG capsule Take 2 g by mouth daily.       . flecainide (TAMBOCOR) 50 MG tablet Take 1 tablet (50 mg total) by mouth 2 (two) times daily.      Marland Kitchen losartan (COZAAR) 100 MG tablet Take 100 mg by mouth daily.      .  Magnesium 250 MG TABS Take by mouth 2 (two) times daily.    0  . metoprolol tartrate (LOPRESSOR) 25 MG tablet Take one tablet twice a day--you can take one-half 50mg  tablet twice a day      . Multiple Vitamins-Minerals (CENTRUM PO) Take 1 tablet by mouth daily.        Marland Kitchen omeprazole (PRILOSEC) 20 MG capsule Take 1 capsule (20 mg total) by mouth daily.      Marland Kitchen warfarin (COUMADIN) 5 MG tablet TAKE ONE TABLET BY MOUTH DAILY  30 tablet  2  . warfarin (COUMADIN) 5 MG tablet Take 1 tablet (5 mg total) by mouth as directed.  35 tablet  3  . amLODipine (NORVASC) 5 MG tablet Take 1 tablet (5 mg total) by mouth daily.  90 tablet  3    BP 169/76  Pulse 59  Ht 5\' 3"  (1.6 m)  Wt 171 lb 12.8 oz (77.928 kg)  BMI 30.43 kg/m2 General: NAD Neck: No JVD, no thyromegaly or thyroid nodule.  Lungs: Clear to auscultation bilaterally with normal respiratory effort. CV: Nondisplaced PMI.  Heart regular S1/S2, no S3/S4, no murmur.  No peripheral edema.  No carotid bruit.  Normal pedal pulses. Varicosities in lower legs bilaterally.  Abdomen: Soft, nontender, no hepatosplenomegaly, no distention.  Neurologic: Alert and oriented x  3.  Psych: Normal affect. Extremities: No clubbing or cyanosis.   Assessment/Plan: 1. Atrial fibrillation: She is doing well on flecainide at this time.  She is in NSR with minimal tachypalpitations.  Continue metoprolol, flecainide, and coumadin.  2. HTN: BP has been running high. I will have her start on amlodipine 5 mg daily.  3. Followup in 6 months with CBC and BMET at that time.   Loralie Champagne 02/08/2012 11:14 AM

## 2012-02-15 ENCOUNTER — Telehealth: Payer: Self-pay | Admitting: Cardiology

## 2012-02-15 DIAGNOSIS — I10 Essential (primary) hypertension: Secondary | ICD-10-CM

## 2012-02-15 NOTE — Telephone Encounter (Signed)
Since pt started back on norvasc she has had a flushed face off and on with a headache across the bridge of her nose and her eyes. With continuation of taking the norvasc she has developed a constant flushed face and headache. Denies itching/ no sob. Pt is on vacation in [place].  Told pt not to take anymore, told her I would send a msg to Dr Aundra Dubin for advise, pt understands Dr is not in office today and may not hear back till tomorrow. She understands to go to ER with further complications. She agreed to plan.

## 2012-02-15 NOTE — Telephone Encounter (Signed)
Pt out of town and taking norvasc 5mg , after taking it she's getting headaches, flushed face with heat, should she decrease dosage?

## 2012-02-15 NOTE — Telephone Encounter (Signed)
Stop amlodipine and start chlorthalidone 12.5 mg daily + KCl 20 mEq daily, BMET and BP check in 2 wks.

## 2012-02-17 MED ORDER — CHLORTHALIDONE 25 MG PO TABS: ORAL_TABLET | ORAL | Status: DC

## 2012-02-17 MED ORDER — POTASSIUM CHLORIDE CRYS ER 20 MEQ PO TBCR: 20.0000 meq | EXTENDED_RELEASE_TABLET | Freq: Every day | ORAL | Status: DC

## 2012-02-17 NOTE — Telephone Encounter (Signed)
LMTCB

## 2012-02-17 NOTE — Telephone Encounter (Signed)
[phone] pt rtn call

## 2012-02-17 NOTE — Telephone Encounter (Signed)
Pt advised, verbalized understanding. Pt will return for BMET 03/02/12. Pt will take and record BP. She will call in 2 weeks with the readings.

## 2012-02-17 NOTE — Telephone Encounter (Signed)
F/U   [phone] - patient returning call back to nurse.

## 2012-02-19 ENCOUNTER — Other Ambulatory Visit: Payer: Self-pay | Admitting: Cardiology

## 2012-03-02 ENCOUNTER — Other Ambulatory Visit (INDEPENDENT_AMBULATORY_CARE_PROVIDER_SITE_OTHER): Payer: Medicare Other

## 2012-03-02 DIAGNOSIS — I10 Essential (primary) hypertension: Secondary | ICD-10-CM

## 2012-03-02 LAB — BASIC METABOLIC PANEL
Chloride: 102 mEq/L (ref 96–112)
Potassium: 4.1 mEq/L (ref 3.5–5.1)
Sodium: 138 mEq/L (ref 135–145)

## 2012-03-08 ENCOUNTER — Emergency Department (HOSPITAL_BASED_OUTPATIENT_CLINIC_OR_DEPARTMENT_OTHER)
Admission: EM | Admit: 2012-03-08 | Discharge: 2012-03-08 | Disposition: A | Discharge status: Home / Self Care | Payer: Medicare Other | Attending: Emergency Medicine | Admitting: Emergency Medicine

## 2012-03-08 ENCOUNTER — Encounter (HOSPITAL_BASED_OUTPATIENT_CLINIC_OR_DEPARTMENT_OTHER): Payer: Self-pay | Admitting: *Deleted

## 2012-03-08 ENCOUNTER — Emergency Department (HOSPITAL_BASED_OUTPATIENT_CLINIC_OR_DEPARTMENT_OTHER): Payer: Medicare Other

## 2012-03-08 DIAGNOSIS — M25061 Hemarthrosis, right knee: Secondary | ICD-10-CM

## 2012-03-08 DIAGNOSIS — E785 Hyperlipidemia, unspecified: Secondary | ICD-10-CM | POA: Insufficient documentation

## 2012-03-08 DIAGNOSIS — Z79899 Other long term (current) drug therapy: Secondary | ICD-10-CM | POA: Insufficient documentation

## 2012-03-08 DIAGNOSIS — I4891 Unspecified atrial fibrillation: Secondary | ICD-10-CM | POA: Insufficient documentation

## 2012-03-08 DIAGNOSIS — Z8719 Personal history of other diseases of the digestive system: Secondary | ICD-10-CM | POA: Insufficient documentation

## 2012-03-08 DIAGNOSIS — M25469 Effusion, unspecified knee: Secondary | ICD-10-CM | POA: Insufficient documentation

## 2012-03-08 DIAGNOSIS — F411 Generalized anxiety disorder: Secondary | ICD-10-CM | POA: Insufficient documentation

## 2012-03-08 DIAGNOSIS — M25069 Hemarthrosis, unspecified knee: Secondary | ICD-10-CM | POA: Insufficient documentation

## 2012-03-08 DIAGNOSIS — M25461 Effusion, right knee: Secondary | ICD-10-CM

## 2012-03-08 DIAGNOSIS — E119 Type 2 diabetes mellitus without complications: Secondary | ICD-10-CM | POA: Insufficient documentation

## 2012-03-08 DIAGNOSIS — Z7901 Long term (current) use of anticoagulants: Secondary | ICD-10-CM | POA: Insufficient documentation

## 2012-03-08 LAB — PROTIME-INR
INR: 2.38 — ABNORMAL HIGH (ref 0.00–1.49)
Prothrombin Time: 24.9 seconds — ABNORMAL HIGH (ref 11.6–15.2)

## 2012-03-08 MED ORDER — OXYCODONE-ACETAMINOPHEN 5-325 MG PO TABS
1.0000 | ORAL_TABLET | Freq: Once | ORAL | Status: AC
  Administered 2012-03-08: 1 via ORAL
  Filled 2012-03-08 (×2): qty 1

## 2012-03-08 MED ORDER — OXYCODONE-ACETAMINOPHEN 5-325 MG PO TABS: 1.0000 | ORAL_TABLET | Freq: Four times a day (QID) | ORAL | Status: DC | PRN

## 2012-03-08 NOTE — ED Provider Notes (Signed)
History     CSN: [id number]  Arrival date & time 03/08/12  1644   First MD Initiated Contact with Patient 03/08/12 1659      Chief Complaint  Patient presents with  . Knee Pain     Patient is a 77 y.o. female presenting with knee pain. The history is provided by the patient.  Knee Pain This is a new problem. Episode onset: just prior to arrival. The problem occurs constantly. The problem has not changed since onset.Pertinent negatives include no headaches. The symptoms are aggravated by bending. The symptoms are relieved by rest. She has tried rest for the symptoms. The treatment provided moderate relief.  pt reports she was standing and turned to walk and "twisted" right knee while at home.  No falls/trauma.  Soon after she noticed pain and swelling to right knee There is no weakness in leg.  No numbness in leg She is on coumadin for afib.  H/o knee arthoscopy  Past Medical History  Diagnosis Date  . Environmental allergies   . Hiatal hernia   . Arthritis     a. R>L knees  . Diabetes mellitus   . Anxiety   . Paroxysmal atrial fibrillation     a. Dx in 2004.  Off coumadin x several yrs;  b. Normal Event Monitor 03/2011.  Marland Kitchen Hyperlipidemia     a. statin intolerant - myalgias    Past Surgical History  Procedure Date  . Cholecystectomy   . Abdominal hysterectomy   . Lipoma tumor removed     right shoulder  . Cystectomy     lower back    Family History  Problem Relation Age of Onset  . Liver disease Mother     cirrhosis - died in 55's  . Heart failure Mother   . Stroke Father     died in 70's  . Coronary artery disease Sister     living  . Coronary artery disease Brother     living    History  Substance Use Topics  . Smoking status: Never Smoker   . Smokeless tobacco: Never Used  . Alcohol Use: No    OB History    Grav Para Term Preterm Abortions TAB SAB Ect Mult Living                  Review of Systems  Constitutional: Negative for fever.   Neurological: Negative for headaches.    Allergies  Celebrex; Clindamycin/lincomycin; Methocarbamol; Penicillins; Promethazine hcl; Statins; and Adhesive  Home Medications   Current Outpatient Rx  Name  Route  Sig  Dispense  Refill  . CALTRATE 600+D PO   Oral   Take 2 tablets by mouth daily.          . CHLORTHALIDONE 25 MG PO TABS      1/2 tablet (total 12.5mg ) daily   30 tablet   6   . CYANOCOBALAMIN 100 MCG PO TABS   Oral   Take 100 mcg by mouth daily.           . OMEGA-3 FATTY ACIDS 1000 MG PO CAPS   Oral   Take 2 g by mouth daily.          Marland Kitchen FLECAINIDE ACETATE 50 MG PO TABS   Oral   Take 1 tablet (50 mg total) by mouth 2 (two) times daily.         Marland Kitchen FLECAINIDE ACETATE 50 MG PO TABS      TAKE ONE TABLET  BY MOUTH TWICE DAILY   60 tablet   5   . LOSARTAN POTASSIUM 100 MG PO TABS   Oral   Take 100 mg by mouth daily.         Marland Kitchen MAGNESIUM 250 MG PO TABS   Oral   Take by mouth 2 (two) times daily.      0   . METOPROLOL TARTRATE 25 MG PO TABS      Take one tablet twice a day--you can take one-half 50mg  tablet twice a day         . CENTRUM PO   Oral   Take 1 tablet by mouth daily.           Marland Kitchen OMEPRAZOLE 20 MG PO CPDR   Oral   Take 1 capsule (20 mg total) by mouth daily.         Marland Kitchen POTASSIUM CHLORIDE CRYS ER 20 MEQ PO TBCR   Oral   Take 1 tablet (20 mEq total) by mouth daily.   30 tablet   6   . WARFARIN SODIUM 5 MG PO TABS      TAKE ONE TABLET BY MOUTH DAILY   30 tablet   2   . WARFARIN SODIUM 5 MG PO TABS   Oral   Take 1 tablet (5 mg total) by mouth as directed.   35 tablet   3     BP 154/66  Pulse 64  Temp 98.3 F (36.8 C) (Oral)  Resp 20  SpO2 98%  Physical Exam CONSTITUTIONAL: Well developed/well nourished HEAD AND FACE: Normocephalic/atraumatic EYES: EOMI ENMT: Mucous membranes moist NECK: supple no meningeal signs CV:  no murmurs/rubs/gallops noted LUNGS: Lungs are clear to auscultation bilaterally, no  apparent distress ABDOMEN: soft, nontender, no rebound or guarding NEURO: Pt is awake/alert, moves all extremitiesx4 EXTREMITIES: pulses normal.  Right knee- effusion  is noted.  No erythema or warmth.  No bruising.  She is limited in ROM due to pain.  She has evidence of previous knee surgery with scar noted. There is large amt of adiposity surrounding knee which makes exam difficult.  The right ankle is nontender to palpation.  No calf tenderness/edema SKIN: warm, color normal PSYCH: no abnormalities of mood noted  ED Course  Procedures   Labs Reviewed  PROTIME-INR   Dg Knee Complete 4 Views Right  03/08/2012  *RADIOLOGY REPORT*  Clinical Data: Knee pain  RIGHT KNEE - COMPLETE 4+ VIEW  Comparison: None.  Findings: Four views of the right knee submitted.  There is diffuse narrowing of the joint space more significant lateral compartment. Trace chondrocalcinosis.  There is spurring of femoral condyles. Significant spurring of the lateral tibial plateau.  Large joint effusion.  Narrowing of patellofemoral joint space.  Prepatellar soft tissue swelling.  Mild spurring of patella. Diffuse osteopenia. No acute fracture or subluxation.  IMPRESSION: No acute fracture or subluxation.  Extensive degenerative changes as described above.  Large joint effusion. Diffuse osteopenia.   Original Report Authenticated By: Lahoma Crocker, M.D.      Pt likely has hemarthrosis due to her coumadin.  She has no signs of compartment syndrome (calf is soft, nontender) She has no neuro deficits in her lower extremities D/w dr Tonita Cong on call for aluisio We agree not to reverse coumadin.  She will wrap knee, elevate, ice and f/u tomorrow and remain NWB (she has crutches at home) We discussed strict return precautions She will continue coumadin for now   MDM  Nursing notes  including past medical history and social history reviewed and considered in documentation xrays reviewed and considered Labs/vital reviewed and  considered         Sharyon Cable, MD 03/08/12 1904

## 2012-03-08 NOTE — ED Notes (Signed)
Twisted her right knee earlier today. Painful. States she has an appointment with her orthopedist tomorrow.

## 2012-03-21 ENCOUNTER — Ambulatory Visit (INDEPENDENT_AMBULATORY_CARE_PROVIDER_SITE_OTHER): Payer: Medicare Other | Admitting: *Deleted

## 2012-03-21 DIAGNOSIS — I4891 Unspecified atrial fibrillation: Secondary | ICD-10-CM

## 2012-03-21 DIAGNOSIS — Z7901 Long term (current) use of anticoagulants: Secondary | ICD-10-CM

## 2012-04-18 ENCOUNTER — Ambulatory Visit (INDEPENDENT_AMBULATORY_CARE_PROVIDER_SITE_OTHER): Payer: Medicare Other | Admitting: *Deleted

## 2012-04-18 DIAGNOSIS — I4891 Unspecified atrial fibrillation: Secondary | ICD-10-CM

## 2012-04-18 DIAGNOSIS — Z7901 Long term (current) use of anticoagulants: Secondary | ICD-10-CM

## 2012-04-18 LAB — POCT INR: INR: 2.7

## 2012-04-19 ENCOUNTER — Other Ambulatory Visit: Payer: Self-pay | Admitting: Cardiology

## 2012-05-30 ENCOUNTER — Ambulatory Visit (INDEPENDENT_AMBULATORY_CARE_PROVIDER_SITE_OTHER): Payer: Medicare Other | Admitting: *Deleted

## 2012-05-30 DIAGNOSIS — I4891 Unspecified atrial fibrillation: Secondary | ICD-10-CM

## 2012-05-30 DIAGNOSIS — Z7901 Long term (current) use of anticoagulants: Secondary | ICD-10-CM

## 2012-05-30 LAB — POCT INR: INR: 2.7

## 2012-07-11 ENCOUNTER — Ambulatory Visit (INDEPENDENT_AMBULATORY_CARE_PROVIDER_SITE_OTHER): Payer: Medicare Other | Admitting: *Deleted

## 2012-07-11 DIAGNOSIS — I4891 Unspecified atrial fibrillation: Secondary | ICD-10-CM

## 2012-07-11 DIAGNOSIS — Z7901 Long term (current) use of anticoagulants: Secondary | ICD-10-CM

## 2012-08-11 ENCOUNTER — Other Ambulatory Visit: Payer: Self-pay | Admitting: Cardiology

## 2012-08-15 ENCOUNTER — Ambulatory Visit (INDEPENDENT_AMBULATORY_CARE_PROVIDER_SITE_OTHER): Payer: Medicare Other | Admitting: *Deleted

## 2012-08-15 DIAGNOSIS — Z7901 Long term (current) use of anticoagulants: Secondary | ICD-10-CM

## 2012-08-15 DIAGNOSIS — I4891 Unspecified atrial fibrillation: Secondary | ICD-10-CM

## 2012-08-31 ENCOUNTER — Other Ambulatory Visit (INDEPENDENT_AMBULATORY_CARE_PROVIDER_SITE_OTHER): Payer: Medicare Other

## 2012-08-31 ENCOUNTER — Ambulatory Visit (INDEPENDENT_AMBULATORY_CARE_PROVIDER_SITE_OTHER): Payer: Medicare Other | Admitting: *Deleted

## 2012-08-31 ENCOUNTER — Encounter: Payer: Self-pay | Admitting: Cardiology

## 2012-08-31 ENCOUNTER — Ambulatory Visit (INDEPENDENT_AMBULATORY_CARE_PROVIDER_SITE_OTHER): Payer: Medicare Other | Admitting: Cardiology

## 2012-08-31 VITALS — BP 142/80 | HR 67 | Ht 63.0 in | Wt 176.0 lb

## 2012-08-31 DIAGNOSIS — I4891 Unspecified atrial fibrillation: Secondary | ICD-10-CM

## 2012-08-31 DIAGNOSIS — I10 Essential (primary) hypertension: Secondary | ICD-10-CM

## 2012-08-31 DIAGNOSIS — Z7901 Long term (current) use of anticoagulants: Secondary | ICD-10-CM

## 2012-08-31 LAB — BASIC METABOLIC PANEL
Calcium: 9.6 mg/dL (ref 8.4–10.5)
Creatinine, Ser: 0.7 mg/dL (ref 0.4–1.2)
GFR: 85.11 mL/min (ref >60.00)
Glucose, Bld: 98 mg/dL (ref 70–99)
Sodium: 138 mEq/L (ref 135–145)

## 2012-08-31 LAB — CBC WITH DIFFERENTIAL/PLATELET
Eosinophils Relative: 2.9 % (ref 0.0–5.0)
HCT: 42.9 % (ref 36.0–46.0)
Lymphs Abs: 3.3 10*3/uL (ref 0.7–4.0)
MCV: 92.1 fl (ref 78.0–100.0)
Monocytes Absolute: 0.7 10*3/uL (ref 0.1–1.0)
Neutro Abs: 2.6 10*3/uL (ref 1.4–7.7)
Platelets: 210 10*3/uL (ref 150.0–400.0)
RDW: 14 % (ref 11.5–14.6)
WBC: 6.8 10*3/uL (ref 4.5–10.5)

## 2012-08-31 MED ORDER — POTASSIUM CHLORIDE CRYS ER 20 MEQ PO TBCR: 20.0000 meq | EXTENDED_RELEASE_TABLET | Freq: Every day | ORAL | Status: DC

## 2012-08-31 NOTE — Patient Instructions (Addendum)
Your physician wants you to follow-up in: 6 months with Dr McLean. (November 2014).  You will receive a reminder letter in the mail two months in advance. If you don't receive a letter, please call our office to schedule the follow-up appointment.  

## 2012-09-02 NOTE — Progress Notes (Signed)
Patient ID: Emily Andrews, female   DOB: 12/09/1934, 78 y.o.   MRN: [id number] PCP: Dr. Maceo Pro  78 yo with history of paroxysmal atrial fibrillation presents for cardiology followup.  She was admitted in 3/13 with very symptomatic atrial fibrillation/RVR.  She went back into NSR on diltiazem gtt overnight.  Initially, I started her on dronedarone and Xarelto.  These medications were both too expensive so she was transitioned to flecainide and coumadin, which she is on currently.  She has been doing well since last appointment.  No runs tachypalpitations, occasional mild fluttering.  No exertional dyspnea or chest pain. BP borderline elevated.   ECG: NSR, 1st degree AV block, poor anterior R wave progression  Labs (5/12): K 4.2, creatinine 0.5, LDL 125, HDL 46 Labs (7/12): K 4.2, creatinine 0.5, AST 63, ALT 64, HCT 43.3 Labs (3/13): LDL 85, HDL 63, cardiac enzymes negative, K 3.8, creatinine 0.64 Labs (11/13): K 4.1, creatinine 0.7  PMH: 1. Paroxysmal atrial fibrillation: No documented recurrence since prior to 2009.  She has not been on warfarin.  Echo (3/13): EF 55-60%, mild MR. 2. Hyperlipidemia: Myalgias and leg weakness with pravastatin (very severe symptoms).  3. HTN 4. Osteoarthritis R > L knee 5. Lexiscan myoview (4/13): EF 78%, no ischemia or infarction.   SH: Lives in [place] with husband.  Nonsmoker.    FH: No premature CAD.   Current Outpatient Prescriptions  Medication Sig Dispense Refill  . Calcium Carbonate-Vitamin D (CALTRATE 600+D PO) Take 2 tablets by mouth daily.       . chlorthalidone (HYGROTON) 25 MG tablet 1/2 tablet (total 12.5mg ) daily  30 tablet  6  . cyanocobalamin 100 MCG tablet Take 100 mcg by mouth daily.        . flecainide (TAMBOCOR) 50 MG tablet TAKE ONE TABLET BY MOUTH TWICE DAILY  60 tablet  1  . losartan (COZAAR) 100 MG tablet Take 100 mg by mouth daily.      . Magnesium 250 MG TABS Take by mouth 2 (two) times daily.    0  . metoprolol  tartrate (LOPRESSOR) 25 MG tablet Take one tablet twice a day--you can take one-half 50mg  tablet twice a day      . Multiple Vitamins-Minerals (CENTRUM PO) Take 1 tablet by mouth daily.        . Omega-3 Fatty Acids (FISH OIL PO) Take 1 tablet by mouth daily.      Marland Kitchen omeprazole (PRILOSEC) 20 MG capsule Take 1 capsule (20 mg total) by mouth daily.      . potassium chloride SA (K-DUR,KLOR-CON) 20 MEQ tablet Take 1 tablet (20 mEq total) by mouth daily.  30 tablet  6  . warfarin (COUMADIN) 5 MG tablet TAKE ONE TABLET BY MOUTH DAILY  30 tablet  2  . warfarin (COUMADIN) 5 MG tablet TAKE ONE TABLET BY MOUTH AS DIRECTED  35 tablet  3   No current facility-administered medications for this visit.    BP 142/80  Pulse 67  Ht 5\' 3"  (1.6 m)  Wt 176 lb (79.833 kg)  BMI 31.18 kg/m2 General: NAD Neck: No JVD, no thyromegaly or thyroid nodule.  Lungs: Clear to auscultation bilaterally with normal respiratory effort. CV: Nondisplaced PMI.  Heart regular S1/S2, no S3/S4, no murmur.  No peripheral edema.  No carotid bruit.  Normal pedal pulses. Varicosities in lower legs bilaterally.  Abdomen: Soft, nontender, no hepatosplenomegaly, no distention.  Neurologic: Alert and oriented x 3.  Psych: Normal affect. Extremities: No clubbing  or cyanosis.   Assessment/Plan: 1. Atrial fibrillation: She is doing well on flecainide at this time.  She is in NSR with minimal tachypalpitations.  Continue metoprolol, flecainide, and coumadin.  2. HTN: BP has been ok on current regimen with chlorthalidone, losartan, and metoprolol.   CBC and BMET today.   Loralie Champagne 09/02/2012 12:59 PM

## 2012-09-03 ENCOUNTER — Telehealth: Payer: Self-pay | Admitting: Cardiology

## 2012-09-03 NOTE — Telephone Encounter (Signed)
Pt notified of recent lab results

## 2012-09-03 NOTE — Telephone Encounter (Signed)
Follow Up  Pt states you called her this  Morning. She assumed it was regarding her lab work.

## 2012-10-12 ENCOUNTER — Other Ambulatory Visit: Payer: Self-pay | Admitting: Cardiology

## 2012-10-12 ENCOUNTER — Ambulatory Visit (INDEPENDENT_AMBULATORY_CARE_PROVIDER_SITE_OTHER): Payer: Self-pay

## 2012-10-12 DIAGNOSIS — I4891 Unspecified atrial fibrillation: Secondary | ICD-10-CM

## 2012-10-12 DIAGNOSIS — Z7901 Long term (current) use of anticoagulants: Secondary | ICD-10-CM

## 2012-11-09 ENCOUNTER — Ambulatory Visit (INDEPENDENT_AMBULATORY_CARE_PROVIDER_SITE_OTHER): Payer: Medicare Other

## 2012-11-09 DIAGNOSIS — I4891 Unspecified atrial fibrillation: Secondary | ICD-10-CM

## 2012-11-09 DIAGNOSIS — Z7901 Long term (current) use of anticoagulants: Secondary | ICD-10-CM

## 2012-12-05 ENCOUNTER — Ambulatory Visit (INDEPENDENT_AMBULATORY_CARE_PROVIDER_SITE_OTHER): Payer: Medicare Other | Admitting: *Deleted

## 2012-12-05 DIAGNOSIS — I4891 Unspecified atrial fibrillation: Secondary | ICD-10-CM

## 2012-12-05 DIAGNOSIS — Z7901 Long term (current) use of anticoagulants: Secondary | ICD-10-CM

## 2012-12-05 LAB — POCT INR: INR: 2.8

## 2013-01-02 ENCOUNTER — Ambulatory Visit (INDEPENDENT_AMBULATORY_CARE_PROVIDER_SITE_OTHER): Payer: Medicare Other | Admitting: *Deleted

## 2013-01-02 DIAGNOSIS — Z7901 Long term (current) use of anticoagulants: Secondary | ICD-10-CM

## 2013-01-02 DIAGNOSIS — I4891 Unspecified atrial fibrillation: Secondary | ICD-10-CM

## 2013-01-11 ENCOUNTER — Other Ambulatory Visit: Payer: Self-pay | Admitting: Cardiology

## 2013-01-16 ENCOUNTER — Ambulatory Visit (INDEPENDENT_AMBULATORY_CARE_PROVIDER_SITE_OTHER): Payer: Medicare Other | Admitting: *Deleted

## 2013-01-16 DIAGNOSIS — I4891 Unspecified atrial fibrillation: Secondary | ICD-10-CM

## 2013-01-16 DIAGNOSIS — Z7901 Long term (current) use of anticoagulants: Secondary | ICD-10-CM

## 2013-02-11 ENCOUNTER — Other Ambulatory Visit: Payer: Self-pay | Admitting: Cardiology

## 2013-02-13 ENCOUNTER — Encounter: Payer: Self-pay | Admitting: Cardiology

## 2013-02-13 ENCOUNTER — Ambulatory Visit (INDEPENDENT_AMBULATORY_CARE_PROVIDER_SITE_OTHER): Payer: Medicare Other | Admitting: Pharmacist

## 2013-02-13 ENCOUNTER — Ambulatory Visit (INDEPENDENT_AMBULATORY_CARE_PROVIDER_SITE_OTHER): Payer: Medicare Other | Admitting: Cardiology

## 2013-02-13 VITALS — BP 140/82 | HR 56 | Ht 63.0 in | Wt 177.8 lb

## 2013-02-13 DIAGNOSIS — I4891 Unspecified atrial fibrillation: Secondary | ICD-10-CM

## 2013-02-13 DIAGNOSIS — I10 Essential (primary) hypertension: Secondary | ICD-10-CM

## 2013-02-13 DIAGNOSIS — Z7901 Long term (current) use of anticoagulants: Secondary | ICD-10-CM

## 2013-02-13 LAB — BASIC METABOLIC PANEL
BUN: 19 mg/dL (ref 6–23)
CO2: 27 mEq/L (ref 19–32)
Calcium: 9.5 mg/dL (ref 8.4–10.5)
GFR: 73.01 mL/min (ref >60.00)
Glucose, Bld: 89 mg/dL (ref 70–99)

## 2013-02-13 LAB — CBC WITH DIFFERENTIAL/PLATELET
Basophils Relative: 0.6 % (ref 0.0–3.0)
Eosinophils Relative: 1.5 % (ref 0.0–5.0)
HCT: 42 % (ref 36.0–46.0)
Hemoglobin: 14.1 g/dL (ref 12.0–15.0)
Lymphs Abs: 3.5 10*3/uL (ref 0.7–4.0)
MCV: 93 fl (ref 78.0–100.0)
Monocytes Absolute: 0.8 10*3/uL (ref 0.1–1.0)
Monocytes Relative: 10.8 % (ref 3.0–12.0)
Neutro Abs: 3 10*3/uL (ref 1.4–7.7)
RBC: 4.51 Mil/uL (ref 3.87–5.11)
WBC: 7.5 10*3/uL (ref 4.5–10.5)

## 2013-02-13 NOTE — Patient Instructions (Signed)
Your physician recommends that you have lab work today--BMET/CBCD  Your physician wants you to follow-up in: 6 months with Dr Aundra Dubin. (May 2015). You will receive a reminder letter in the mail two months in advance. If you don't receive a letter, please call our office to schedule the follow-up appointment.

## 2013-02-13 NOTE — Progress Notes (Signed)
Patient ID: Emily Andrews, female   DOB: Jul 26, 1934, 78 y.o.   MRN: [id number] PCP: Dr. Maceo Pro  78 yo with history of paroxysmal atrial fibrillation presents for cardiology followup.  She was admitted in 3/13 with very symptomatic atrial fibrillation/RVR.  She went back into NSR on diltiazem gtt overnight.  Initially, I started her on dronedarone and Xarelto.  These medications were both too expensive so she was transitioned to flecainide and coumadin, which she is on currently.  She has been doing well since last appointment.  No runs tachypalpitations, occasional mild fluttering.  No exertional dyspnea or chest pain. BP borderline elevated.   ECG: NSR, 1st degree AV block 230 msec, LVH  Labs (5/12): K 4.2, creatinine 0.5, LDL 125, HDL 46 Labs (7/12): K 4.2, creatinine 0.5, AST 63, ALT 64, HCT 43.3 Labs (3/13): LDL 85, HDL 63, cardiac enzymes negative, K 3.8, creatinine 0.64 Labs (11/13): K 4.1, creatinine 0.7 Labs (5/14): K 3.8, creatinine 0.7, HCT 42.9  PMH: 1. Paroxysmal atrial fibrillation: No documented recurrence since prior to 2009.  She has not been on warfarin.  Echo (3/13): EF 55-60%, mild MR. 2. Hyperlipidemia: Myalgias and leg weakness with pravastatin (very severe symptoms).  3. HTN 4. Osteoarthritis R > L knee 5. Lexiscan myoview (4/13): EF 78%, no ischemia or infarction.   SH: Lives in [place] with husband.  Nonsmoker.    FH: No premature CAD.   Current Outpatient Prescriptions  Medication Sig Dispense Refill  . Calcium Carbonate-Vitamin D (CALTRATE 600+D PO) Take 2 tablets by mouth daily.       . chlorthalidone (HYGROTON) 25 MG tablet 1/2 tablet (total 12.5mg ) daily  30 tablet  6  . cyanocobalamin 100 MCG tablet Take 100 mcg by mouth daily.        . flecainide (TAMBOCOR) 50 MG tablet TAKE ONE TABLET BY MOUTH TWICE DAILY  60 tablet  1  . losartan (COZAAR) 100 MG tablet Take 100 mg by mouth daily.      . Magnesium 250 MG TABS Take by mouth 2 (two) times daily.     0  . metoprolol tartrate (LOPRESSOR) 25 MG tablet Take one tablet twice a day--you can take one-half 50mg  tablet twice a day      . Multiple Vitamins-Minerals (CENTRUM PO) Take 1 tablet by mouth daily.        . Omega-3 Fatty Acids (FISH OIL PO) Take 1 tablet by mouth daily.      Marland Kitchen omeprazole (PRILOSEC) 20 MG capsule Take 1 capsule (20 mg total) by mouth daily.      . potassium chloride SA (K-DUR,KLOR-CON) 20 MEQ tablet Take 1 tablet (20 mEq total) by mouth daily.  30 tablet  6  . warfarin (COUMADIN) 5 MG tablet TAKE ONE TABLET BY MOUTH DAILY  30 tablet  2   No current facility-administered medications for this visit.    BP 140/82  Pulse 56  Ht 5\' 3"  (1.6 m)  Wt 177 lb 12.8 oz (80.65 kg)  BMI 31.50 kg/m2 General: NAD Neck: No JVD, no thyromegaly or thyroid nodule.  Lungs: Clear to auscultation bilaterally with normal respiratory effort. CV: Nondisplaced PMI.  Heart regular S1/S2, no S3/S4, no murmur.  No peripheral edema.  No carotid bruit.  Normal pedal pulses. Varicosities in lower legs bilaterally.  Abdomen: Soft, nontender, no hepatosplenomegaly, no distention.  Neurologic: Alert and oriented x 3.  Psych: Normal affect. Extremities: No clubbing or cyanosis.   Assessment/Plan: 1. Atrial fibrillation: She is doing  well on flecainide at this time.  She is in NSR with minimal tachypalpitations.  Continue metoprolol, flecainide, and coumadin.  CBC today.  2. HTN: BP has been ok on current regimen with chlorthalidone, losartan, and metoprolol.  BMET today.  Loralie Champagne 02/13/2013 11:17 PM

## 2013-03-13 ENCOUNTER — Ambulatory Visit (INDEPENDENT_AMBULATORY_CARE_PROVIDER_SITE_OTHER): Payer: Medicare Other | Admitting: *Deleted

## 2013-03-13 DIAGNOSIS — I4891 Unspecified atrial fibrillation: Secondary | ICD-10-CM

## 2013-03-13 DIAGNOSIS — Z7901 Long term (current) use of anticoagulants: Secondary | ICD-10-CM

## 2013-03-13 LAB — POCT INR: INR: 2.5

## 2013-04-02 ENCOUNTER — Other Ambulatory Visit: Payer: Self-pay | Admitting: Cardiology

## 2013-04-10 ENCOUNTER — Ambulatory Visit (INDEPENDENT_AMBULATORY_CARE_PROVIDER_SITE_OTHER): Payer: Medicare Other | Admitting: *Deleted

## 2013-04-10 DIAGNOSIS — I4891 Unspecified atrial fibrillation: Secondary | ICD-10-CM

## 2013-04-10 DIAGNOSIS — Z7901 Long term (current) use of anticoagulants: Secondary | ICD-10-CM

## 2013-04-10 LAB — POCT INR: INR: 2.1

## 2013-04-10 NOTE — Addendum Note (Signed)
Addended by: Margretta Sidle on: 04/10/2013 08:21 AM   Modules accepted: Level of Service

## 2013-04-11 ENCOUNTER — Other Ambulatory Visit: Payer: Self-pay | Admitting: Cardiology

## 2013-05-03 ENCOUNTER — Other Ambulatory Visit: Payer: Self-pay | Admitting: Cardiology

## 2013-05-20 ENCOUNTER — Ambulatory Visit (INDEPENDENT_AMBULATORY_CARE_PROVIDER_SITE_OTHER): Payer: Medicare Other | Admitting: *Deleted

## 2013-05-20 ENCOUNTER — Encounter (INDEPENDENT_AMBULATORY_CARE_PROVIDER_SITE_OTHER): Payer: Self-pay

## 2013-05-20 DIAGNOSIS — Z5181 Encounter for therapeutic drug level monitoring: Secondary | ICD-10-CM

## 2013-05-20 DIAGNOSIS — Z7901 Long term (current) use of anticoagulants: Secondary | ICD-10-CM

## 2013-05-20 DIAGNOSIS — I4891 Unspecified atrial fibrillation: Secondary | ICD-10-CM

## 2013-05-20 LAB — POCT INR: INR: 2.4

## 2013-06-03 ENCOUNTER — Other Ambulatory Visit: Payer: Self-pay | Admitting: Cardiology

## 2013-06-24 ENCOUNTER — Other Ambulatory Visit: Payer: Self-pay | Admitting: Cardiology

## 2013-07-01 ENCOUNTER — Ambulatory Visit (INDEPENDENT_AMBULATORY_CARE_PROVIDER_SITE_OTHER): Payer: Medicare Other

## 2013-07-01 DIAGNOSIS — Z5181 Encounter for therapeutic drug level monitoring: Secondary | ICD-10-CM

## 2013-07-01 DIAGNOSIS — Z7901 Long term (current) use of anticoagulants: Secondary | ICD-10-CM

## 2013-07-01 DIAGNOSIS — I4891 Unspecified atrial fibrillation: Secondary | ICD-10-CM

## 2013-07-01 LAB — POCT INR: INR: 2.6

## 2013-08-05 ENCOUNTER — Other Ambulatory Visit: Payer: Self-pay | Admitting: Cardiology

## 2013-08-09 ENCOUNTER — Ambulatory Visit (INDEPENDENT_AMBULATORY_CARE_PROVIDER_SITE_OTHER): Payer: Medicare Other | Admitting: Pharmacist

## 2013-08-09 ENCOUNTER — Encounter (INDEPENDENT_AMBULATORY_CARE_PROVIDER_SITE_OTHER): Payer: Self-pay

## 2013-08-09 ENCOUNTER — Ambulatory Visit (INDEPENDENT_AMBULATORY_CARE_PROVIDER_SITE_OTHER): Payer: Medicare Other | Admitting: Cardiology

## 2013-08-09 VITALS — BP 148/78 | HR 50 | Ht 64.0 in | Wt 175.4 lb

## 2013-08-09 DIAGNOSIS — Z5181 Encounter for therapeutic drug level monitoring: Secondary | ICD-10-CM

## 2013-08-09 DIAGNOSIS — I4891 Unspecified atrial fibrillation: Secondary | ICD-10-CM

## 2013-08-09 DIAGNOSIS — Z7901 Long term (current) use of anticoagulants: Secondary | ICD-10-CM

## 2013-08-09 DIAGNOSIS — I10 Essential (primary) hypertension: Secondary | ICD-10-CM

## 2013-08-09 DIAGNOSIS — E7800 Pure hypercholesterolemia, unspecified: Secondary | ICD-10-CM

## 2013-08-09 LAB — BASIC METABOLIC PANEL
BUN: 17 mg/dL (ref 6–23)
CALCIUM: 10 mg/dL (ref 8.4–10.5)
CO2: 30 meq/L (ref 19–32)
CREATININE: 0.7 mg/dL (ref 0.4–1.2)
Chloride: 102 mEq/L (ref 96–112)
GFR: 92.36 mL/min (ref >60.00)
Glucose, Bld: 92 mg/dL (ref 70–99)
Potassium: 3.8 mEq/L (ref 3.5–5.1)
Sodium: 140 mEq/L (ref 135–145)

## 2013-08-09 LAB — CBC
HCT: 42.3 % (ref 36.0–46.0)
Hemoglobin: 14.2 g/dL (ref 12.0–15.0)
MCHC: 33.6 g/dL (ref 30.0–36.0)
MCV: 93.5 fl (ref 78.0–100.0)
Platelets: 229 10*3/uL (ref 150.0–400.0)
RBC: 4.52 Mil/uL (ref 3.87–5.11)
RDW: 13.8 % (ref 11.5–15.5)
WBC: 7.5 10*3/uL (ref 4.0–10.5)

## 2013-08-09 LAB — POCT INR: INR: 2.3

## 2013-08-09 NOTE — Patient Instructions (Signed)
Your physician recommends that you continue on your current medications as directed. Please refer to the Current Medication list given to you today.  Your physician recommends that you go to the lab today for a BMET and CBC  Your physician wants you to follow-up in: 6 months with Dr Kendall Flack will receive a reminder letter in the mail two months in advance. If you don't receive a letter, please call our office to schedule the follow-up appointment.

## 2013-08-11 ENCOUNTER — Encounter: Payer: Self-pay | Admitting: Cardiology

## 2013-08-11 NOTE — Progress Notes (Signed)
Patient ID: Emily Andrews, female   DOB: 05-03-1934, 79 y.o.   MRN: [id number] PCP: Dr. Maceo Pro  79 yo with history of paroxysmal atrial fibrillation presents for cardiology followup.  She was admitted in 3/13 with very symptomatic atrial fibrillation/RVR.  She went back into NSR on diltiazem gtt overnight.  Initially, I started her on dronedarone and Xarelto.  These medications were both too expensive so she was transitioned to flecainide and coumadin, which she is on currently.  She has been doing well since last appointment.  No runs tachypalpitations, occasional mild fluttering.  No exertional dyspnea or chest pain. BP mildly elevated.   ECG: NSR at 50, inferior and anterolateral T wave inversions.  Labs (5/12): K 4.2, creatinine 0.5, LDL 125, HDL 46 Labs (7/12): K 4.2, creatinine 0.5, AST 63, ALT 64, HCT 43.3 Labs (3/13): LDL 85, HDL 63, cardiac enzymes negative, K 3.8, creatinine 0.64 Labs (11/13): K 4.1, creatinine 0.7 Labs (5/14): K 3.8, creatinine 0.7, HCT 42.9 Labs (11/15): K 4.1, creatinine 0.8, HCT 42  PMH: 1. Paroxysmal atrial fibrillation:  Echo (3/13) with EF 55-60%, mild MR. 2. Hyperlipidemia: Myalgias and leg weakness with pravastatin (very severe symptoms).  3. HTN 4. Osteoarthritis R > L knee 5. Lexiscan myoview (4/13): EF 78%, no ischemia or infarction.   SH: Lives in [place] with husband.  Nonsmoker.    FH: No premature CAD.   Current Outpatient Prescriptions  Medication Sig Dispense Refill  . Calcium Carbonate-Vitamin D (CALTRATE 600+D PO) Take 2 tablets by mouth daily.       . chlorthalidone (HYGROTON) 25 MG tablet TAKE ONE-HALF TABLET BY MOUTH ONCE DAILY  30 tablet  1  . cyanocobalamin 100 MCG tablet Take 100 mcg by mouth daily.        . flecainide (TAMBOCOR) 50 MG tablet TAKE ONE TABLET BY MOUTH TWICE DAILY. NEED OFFICE VISIT  60 tablet  0  . KLOR-CON M20 20 MEQ tablet TAKE ONE TABLET BY MOUTH ONCE DAILY  30 tablet  3  . losartan (COZAAR) 100 MG  tablet Take 100 mg by mouth daily.      . Magnesium 250 MG TABS Take by mouth 2 (two) times daily.    0  . metoprolol tartrate (LOPRESSOR) 25 MG tablet Take one tablet twice a day--you can take one-half 50mg  tablet twice a day      . Multiple Vitamins-Minerals (CENTRUM PO) Take 1 tablet by mouth daily.        . Omega-3 Fatty Acids (FISH OIL PO) Take 1 tablet by mouth daily.      Marland Kitchen omeprazole (PRILOSEC) 20 MG capsule Take 1 capsule (20 mg total) by mouth daily.      Marland Kitchen warfarin (COUMADIN) 5 MG tablet TAKE ONE TABLET BY MOUTH AS DIRECTED  35 tablet  3   No current facility-administered medications for this visit.    BP 148/78  Pulse 50  Ht 5\' 4"  (1.626 m)  Wt 79.561 kg (175 lb 6.4 oz)  BMI 30.09 kg/m2 General: NAD Neck: No JVD, no thyromegaly or thyroid nodule.  Lungs: Clear to auscultation bilaterally with normal respiratory effort. CV: Nondisplaced PMI.  Heart regular S1/S2, no S3/S4, no murmur.  No peripheral edema.  No carotid bruit.  Normal pedal pulses. Varicosities in lower legs bilaterally.  Abdomen: Soft, nontender, no hepatosplenomegaly, no distention.  Neurologic: Alert and oriented x 3.  Psych: Normal affect. Extremities: No clubbing or cyanosis.   Assessment/Plan: 1. Atrial fibrillation: She is doing well on  flecainide at this time.  She is in NSR with minimal tachypalpitations.  Continue metoprolol, flecainide, and coumadin.  CBC today.  2. HTN: BP has been ok on current regimen with chlorthalidone, losartan, and metoprolol.  BMET today.  Larey Dresser 08/11/2013

## 2013-08-31 ENCOUNTER — Other Ambulatory Visit: Payer: Self-pay | Admitting: Cardiology

## 2013-09-20 ENCOUNTER — Ambulatory Visit (INDEPENDENT_AMBULATORY_CARE_PROVIDER_SITE_OTHER): Payer: Medicare Other | Admitting: *Deleted

## 2013-09-20 DIAGNOSIS — Z5181 Encounter for therapeutic drug level monitoring: Secondary | ICD-10-CM

## 2013-09-20 DIAGNOSIS — I4891 Unspecified atrial fibrillation: Secondary | ICD-10-CM

## 2013-09-20 DIAGNOSIS — Z7901 Long term (current) use of anticoagulants: Secondary | ICD-10-CM

## 2013-09-20 LAB — POCT INR: INR: 4

## 2013-09-27 ENCOUNTER — Ambulatory Visit (INDEPENDENT_AMBULATORY_CARE_PROVIDER_SITE_OTHER): Payer: Medicare Other

## 2013-09-27 DIAGNOSIS — I4891 Unspecified atrial fibrillation: Secondary | ICD-10-CM

## 2013-09-27 DIAGNOSIS — Z7901 Long term (current) use of anticoagulants: Secondary | ICD-10-CM

## 2013-09-27 DIAGNOSIS — Z5181 Encounter for therapeutic drug level monitoring: Secondary | ICD-10-CM

## 2013-09-27 LAB — POCT INR: INR: 2.2

## 2013-09-27 MED ORDER — POTASSIUM CHLORIDE CRYS ER 20 MEQ PO TBCR: 20.0000 meq | EXTENDED_RELEASE_TABLET | Freq: Every day | ORAL | Status: DC

## 2013-09-27 MED ORDER — FLECAINIDE ACETATE 50 MG PO TABS: 50.0000 mg | ORAL_TABLET | Freq: Two times a day (BID) | ORAL | Status: DC

## 2013-10-18 ENCOUNTER — Ambulatory Visit (INDEPENDENT_AMBULATORY_CARE_PROVIDER_SITE_OTHER): Payer: Medicare Other | Admitting: Pharmacist

## 2013-10-18 DIAGNOSIS — Z7901 Long term (current) use of anticoagulants: Secondary | ICD-10-CM

## 2013-10-18 DIAGNOSIS — I4891 Unspecified atrial fibrillation: Secondary | ICD-10-CM

## 2013-10-18 DIAGNOSIS — Z5181 Encounter for therapeutic drug level monitoring: Secondary | ICD-10-CM

## 2013-10-18 LAB — POCT INR: INR: 2.5

## 2013-11-15 ENCOUNTER — Ambulatory Visit (INDEPENDENT_AMBULATORY_CARE_PROVIDER_SITE_OTHER): Payer: Medicare Other | Admitting: Pharmacist

## 2013-11-15 DIAGNOSIS — I4891 Unspecified atrial fibrillation: Secondary | ICD-10-CM

## 2013-11-15 DIAGNOSIS — Z7901 Long term (current) use of anticoagulants: Secondary | ICD-10-CM

## 2013-11-15 DIAGNOSIS — Z5181 Encounter for therapeutic drug level monitoring: Secondary | ICD-10-CM

## 2013-11-15 LAB — POCT INR: INR: 2.8

## 2013-11-18 ENCOUNTER — Other Ambulatory Visit: Payer: Self-pay | Admitting: Cardiology

## 2013-11-28 ENCOUNTER — Other Ambulatory Visit: Payer: Self-pay | Admitting: Cardiology

## 2013-12-16 ENCOUNTER — Ambulatory Visit (INDEPENDENT_AMBULATORY_CARE_PROVIDER_SITE_OTHER): Payer: Medicare Other

## 2013-12-16 DIAGNOSIS — I4891 Unspecified atrial fibrillation: Secondary | ICD-10-CM

## 2013-12-16 DIAGNOSIS — Z5181 Encounter for therapeutic drug level monitoring: Secondary | ICD-10-CM

## 2013-12-16 DIAGNOSIS — Z7901 Long term (current) use of anticoagulants: Secondary | ICD-10-CM

## 2013-12-16 LAB — POCT INR: INR: 2.8

## 2014-01-27 ENCOUNTER — Ambulatory Visit (INDEPENDENT_AMBULATORY_CARE_PROVIDER_SITE_OTHER): Payer: Medicare Other

## 2014-01-27 DIAGNOSIS — Z7901 Long term (current) use of anticoagulants: Secondary | ICD-10-CM

## 2014-01-27 DIAGNOSIS — Z5181 Encounter for therapeutic drug level monitoring: Secondary | ICD-10-CM

## 2014-01-27 DIAGNOSIS — I4891 Unspecified atrial fibrillation: Secondary | ICD-10-CM

## 2014-01-27 LAB — POCT INR: INR: 2.1

## 2014-01-28 ENCOUNTER — Emergency Department (HOSPITAL_COMMUNITY)
Admission: EM | Admit: 2014-01-28 | Discharge: 2014-01-28 | Disposition: A | Discharge status: Home / Self Care | Payer: Medicare Other | Attending: Emergency Medicine | Admitting: Emergency Medicine

## 2014-01-28 ENCOUNTER — Encounter (HOSPITAL_COMMUNITY): Payer: Self-pay | Admitting: Emergency Medicine

## 2014-01-28 ENCOUNTER — Emergency Department (HOSPITAL_COMMUNITY): Payer: Medicare Other

## 2014-01-28 DIAGNOSIS — M1990 Unspecified osteoarthritis, unspecified site: Secondary | ICD-10-CM | POA: Insufficient documentation

## 2014-01-28 DIAGNOSIS — E119 Type 2 diabetes mellitus without complications: Secondary | ICD-10-CM | POA: Diagnosis not present

## 2014-01-28 DIAGNOSIS — I480 Paroxysmal atrial fibrillation: Secondary | ICD-10-CM | POA: Diagnosis not present

## 2014-01-28 DIAGNOSIS — M79602 Pain in left arm: Secondary | ICD-10-CM | POA: Diagnosis not present

## 2014-01-28 DIAGNOSIS — F419 Anxiety disorder, unspecified: Secondary | ICD-10-CM | POA: Diagnosis not present

## 2014-01-28 DIAGNOSIS — Z880 Allergy status to penicillin: Secondary | ICD-10-CM | POA: Insufficient documentation

## 2014-01-28 DIAGNOSIS — R079 Chest pain, unspecified: Secondary | ICD-10-CM | POA: Diagnosis present

## 2014-01-28 DIAGNOSIS — E785 Hyperlipidemia, unspecified: Secondary | ICD-10-CM | POA: Diagnosis not present

## 2014-01-28 DIAGNOSIS — Z8719 Personal history of other diseases of the digestive system: Secondary | ICD-10-CM | POA: Diagnosis not present

## 2014-01-28 DIAGNOSIS — Z7901 Long term (current) use of anticoagulants: Secondary | ICD-10-CM | POA: Insufficient documentation

## 2014-01-28 DIAGNOSIS — I10 Essential (primary) hypertension: Secondary | ICD-10-CM

## 2014-01-28 DIAGNOSIS — Z79899 Other long term (current) drug therapy: Secondary | ICD-10-CM | POA: Insufficient documentation

## 2014-01-28 DIAGNOSIS — R200 Anesthesia of skin: Secondary | ICD-10-CM | POA: Diagnosis not present

## 2014-01-28 LAB — PROTIME-INR
INR: 2.13 — AB (ref 0.00–1.49)
Prothrombin Time: 24 seconds — ABNORMAL HIGH (ref 11.6–15.2)

## 2014-01-28 LAB — COMPREHENSIVE METABOLIC PANEL
ALBUMIN: 3.5 g/dL (ref 3.5–5.2)
ALK PHOS: 56 U/L (ref 39–117)
ALT: 17 U/L (ref 0–35)
AST: 23 U/L (ref 0–37)
Anion gap: 16 — ABNORMAL HIGH (ref 5–15)
BUN: 20 mg/dL (ref 6–23)
CO2: 21 mEq/L (ref 19–32)
Calcium: 9.5 mg/dL (ref 8.4–10.5)
Chloride: 102 mEq/L (ref 96–112)
Creatinine, Ser: 0.67 mg/dL (ref 0.50–1.10)
GFR calc Af Amer: >90 mL/min (ref >90)
GFR calc non Af Amer: 83 mL/min — ABNORMAL LOW (ref >90)
Glucose, Bld: 165 mg/dL — ABNORMAL HIGH (ref 70–99)
POTASSIUM: 3.8 meq/L (ref 3.7–5.3)
Sodium: 139 mEq/L (ref 137–147)
TOTAL PROTEIN: 6.9 g/dL (ref 6.0–8.3)
Total Bilirubin: 0.8 mg/dL (ref 0.3–1.2)

## 2014-01-28 LAB — TROPONIN I
Troponin I: <0.3 ng/mL (ref <0.30)
Troponin I: <0.3 ng/mL (ref <0.30)

## 2014-01-28 LAB — CBC WITH DIFFERENTIAL/PLATELET
BASOS PCT: 1 % (ref 0–1)
Basophils Absolute: 0 10*3/uL (ref 0.0–0.1)
EOS ABS: 0.1 10*3/uL (ref 0.0–0.7)
Eosinophils Relative: 3 % (ref 0–5)
HCT: 41.8 % (ref 36.0–46.0)
Hemoglobin: 14.3 g/dL (ref 12.0–15.0)
LYMPHS ABS: 2.1 10*3/uL (ref 0.7–4.0)
Lymphocytes Relative: 39 % (ref 12–46)
MCH: 31.8 pg (ref 26.0–34.0)
MCHC: 34.2 g/dL (ref 30.0–36.0)
MCV: 92.9 fL (ref 78.0–100.0)
Monocytes Absolute: 0.4 10*3/uL (ref 0.1–1.0)
Monocytes Relative: 8 % (ref 3–12)
NEUTROS PCT: 49 % (ref 43–77)
Neutro Abs: 2.7 10*3/uL (ref 1.7–7.7)
Platelets: 200 10*3/uL (ref 150–400)
RBC: 4.5 MIL/uL (ref 3.87–5.11)
RDW: 13.7 % (ref 11.5–15.5)
WBC: 5.3 10*3/uL (ref 4.0–10.5)

## 2014-01-28 MED ORDER — SODIUM CHLORIDE 0.9 % IV BOLUS (SEPSIS)
500.0000 mL | Freq: Once | INTRAVENOUS | Status: AC
  Administered 2014-01-28: 500 mL via INTRAVENOUS

## 2014-01-28 NOTE — ED Notes (Signed)
Pt transported to xray 

## 2014-01-28 NOTE — Discharge Instructions (Signed)
Please call your doctor for a followup appointment within 24-48 hours. When you talk to your doctor please let them know that you were seen in the emergency department and have them acquire all of your records so that they can discuss the findings with you and formulate a treatment plan to fully care for your new and ongoing problems. Please call and set up an appointment with your primary care provider Please call and keep appointment with cardiologist in the beginning of November Please rest and stay hydrated Please continue to take at home medications as prescribed Please continue to monitor symptoms closely and if symptoms are to worsen or change (fever greater than 101, chills, sweating, nausea, vomiting, chest pain, shortness of breathe, difficulty breathing, weakness, numbness, tingling, worsening or changes to pain pattern, fainting or feeling as if you are going to faint) please report back to the Emergency Department immediately.    Atrial Fibrillation Atrial fibrillation is a condition that causes your heart to beat irregularly. It may also cause your heart to beat faster than normal. Atrial fibrillation can prevent your heart from pumping blood normally. It increases your risk of stroke and heart problems. HOME CARE  Take medications as told by your doctor.  Only take medications that your doctor says are safe. Some medications can make the condition worse or happen again.  If blood thinners were prescribed by your doctor, take them exactly as told. Too much can cause bleeding. Too little and you will not have the needed protection against stroke and other problems.  Perform blood tests at home if told by your doctor.  Perform blood tests exactly as told by your doctor.  Do not drink alcohol.  Do not drink beverages with caffeine such as coffee, soda, and some teas.  Maintain a healthy weight.  Do not use diet pills unless your doctor says they are safe. They may make heart  problems worse.  Follow diet instructions as told by your doctor.  Exercise regularly as told by your doctor.  Keep all follow-up appointments. GET HELP IF:  You notice a change in the speed, rhythm, or strength of your heartbeat.  You suddenly begin peeing (urinating) more often.  You get tired more easily when moving or exercising. GET HELP RIGHT AWAY IF:   You have chest or belly (abdominal) pain.  You feel sick to your stomach (nauseous).  You are short of breath.  You suddenly have swollen feet and ankles.  You feel dizzy.  You face, arms, or legs feel numb or weak.  There is a change in your vision or speech. MAKE SURE YOU:   Understand these instructions.  Will watch your condition.  Will get help right away if you are not doing well or get worse. Document Released: 12/29/2007 Document Revised: 08/05/2013 Document Reviewed: 05/01/2012 [facility] Patient Information 2015 [place], [place]. This information is not intended to replace advice given to you by your health care provider. Make sure you discuss any questions you have with your health care provider.

## 2014-01-28 NOTE — Consult Note (Signed)
Name: Emily Andrews is a 79 y.o. female Admit date: 01/28/2014 Referring Physician:  Emergency department Primary Physician:  Georga Bora, M.D. Primary Cardiologist:  Rowland Lathe, M.D.  Reason for Consultation:  Chest tightness  ASSESSMENT: 1. Paroxysmal atrial fibrillation, documented on site by EMS, spontaneous resolution after taking a.m. dose of flecainide, and now feeling back to baseline. 2. Chest tightness and dyspnea during atrial fibrillation, now resolved. Troponin is unremarkable. 3. Chronic anticoagulation 4. Chronic flecainide therapy 5. History of hypertension  PLAN: 1. The patient can be safely discharged from the emergency room now that atrial fibrillation has resolved. 2. No further workup is necessary under the circumstances. This recurrent episode of atrial fibrillation this and antral history of PAF and patient's on medical therapy for rhythm control. 3. Follow-up with Dr. Aundra Dubin as previously scheduled   HPI: 79 year old with well documented history of paroxysmal atrial fibrillation since 2004. She was sitting drinking coffee this morning and suddenly developed a racing sensation in her chest with associated tightness and dyspnea. These are typical associations when she develops atrial fibrillation. She became concerned after it persisted approximately 15 minutes. She therefore called 911 and was eventually brought to the emergency room. Initial electrocardiograms done revealed atrial fib with a rapid rate into the 120s. The patient had also documented a rising blood pressure and a rapid pulse on her blood pressure machine when the symptoms started. The whole episode lasted 30 minutes. She now feels back to baseline.  Attendant family, her husband and daughter, feels that she is back to baseline as does the patient.  PMH:   Past Medical History  Diagnosis Date  . Environmental allergies   . Hiatal hernia   . Arthritis     a. R>L knees  . Diabetes  mellitus   . Anxiety   . Paroxysmal atrial fibrillation     a. Dx in 2004.  Off coumadin x several yrs;  b. Normal Event Monitor 03/2011.  Marland Kitchen Hyperlipidemia     a. statin intolerant - myalgias    PSH:   Past Surgical History  Procedure Laterality Date  . Cholecystectomy    . Abdominal hysterectomy    . Lipoma tumor removed      right shoulder  . Cystectomy      lower back   Allergies:  Celebrex; Clindamycin/lincomycin; Methocarbamol; Penicillins; Promethazine hcl; Statins; and Adhesive Prior to Admit Meds:   (Not in a hospital admission) Fam HX:    Family History  Problem Relation Age of Onset  . Liver disease Mother     cirrhosis - died in 70's  . Heart failure Mother   . Stroke Father     died in 9's  . Coronary artery disease Sister     living  . Coronary artery disease Brother     living   Social HX:    History   Social History  . Marital Status: Married    Spouse Name: N/A    Number of Children: N/A  . Years of Education: N/A   Occupational History  . Not on file.   Social History Main Topics  . Smoking status: Never Smoker   . Smokeless tobacco: Never Used  . Alcohol Use: No  . Drug Use: No  . Sexual Activity: Not on file   Other Topics Concern  . Not on file   Social History Narrative   Lives in [place] with husband.  Active @ home.     Review of Systems:  She has not had a recent fall or head trauma. She denies dyspnea. No neurological complaints. There is no peripheral edema.. She has had a cough.  Physical Exam: Blood pressure 117/56, pulse 68, temperature 98 F (36.7 C), temperature source Oral, resp. rate 20, height 5\' 4"  (1.626 m), weight 169 lb (76.658 kg), SpO2 97.00%. Weight change:   Lying flat on the hospital gurney. Respiratory rate is normal. Skin is clear without jaundice or pallor Neck veins are not distended Chest is clear anteriorly and posteriorly Cardiac exam is unremarkable. No obvious rub or gallop is heard. The  abdomen is soft. Normal bowel sounds. No tenderness. Extremities reveal no edema. Neurological exam is normal. Labs: Lab Results  Component Value Date   WBC 5.3 01/28/2014   HGB 14.3 01/28/2014   HCT 41.8 01/28/2014   MCV 92.9 01/28/2014   PLT 200 01/28/2014    Recent Labs Lab 01/28/14 1028  NA 139  K 3.8  CL 102  CO2 21  BUN 20  CREATININE 0.67  CALCIUM 9.5  PROT 6.9  BILITOT 0.8  ALKPHOS 56  ALT 17  AST 23  GLUCOSE 165*   No results found for this basename: PTT   Lab Results  Component Value Date   INR 2.13* 01/28/2014   INR 2.1 01/27/2014   INR 2.8 12/16/2013   Lab Results  Component Value Date   CKTOTAL 63 06/28/2011   CKMB 2.4 06/28/2011   TROPONINI <0.30 01/28/2014     Lab Results  Component Value Date   CHOL 162 06/28/2011   CHOL 199 08/18/2010   Lab Results  Component Value Date   HDL 63 06/28/2011   HDL 45.90 08/18/2010   Lab Results  Component Value Date   LDLCALC 85 06/28/2011   LDLCALC 125* 08/18/2010   Lab Results  Component Value Date   TRIG 69 06/28/2011   TRIG 140.0 08/18/2010   Lab Results  Component Value Date   CHOLHDL 2.6 06/28/2011   CHOLHDL 4 08/18/2010   No results found for this basename: LDLDIRECT      Radiology:  Dg Chest 2 View  01/28/2014   CLINICAL DATA:  Sternal region chest pain, acute  EXAM: CHEST  2 VIEW  COMPARISON:  October 27, 2012  FINDINGS: There is slight scarring in the left base. Elsewhere lungs are clear. Heart size and pulmonary vascularity are normal. No adenopathy. 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 There is mild degenerative change in the thoracic spine.  IMPRESSION: Mild scarring left base.  No edema or consolidation.   Electronically Signed   By: Lowella Grip M.D.   On: 01/28/2014 11:27    EKG:  Normal sinus rhythm with precordial poor R-wave progression. No acute ST-T wave changes noted. No change when compared to prior tracings.    Sinclair Grooms 01/28/2014 12:59 PM

## 2014-01-28 NOTE — ED Notes (Signed)
EMS - Patient coming from home with c/o of left chest tightness that radiated toward the throat and left shoulder.  Pt has a history of Afib with a heart rate on scene of 130-140.  Per EMS patient given 324mg  Aspirin and 3 Nitro and converted from a pulse rate of 117 to the current 84 heart rate.  Pt BP changed from 140/90 to 120/80 (after 3 Nitro) to a current 160/90.     At this time, patient is denying any chest pains, no shortness of breath, no N/V/D.

## 2014-01-28 NOTE — ED Provider Notes (Signed)
CSN: [id number]     Arrival date & time 01/28/14  1012 History   First MD Initiated Contact with Patient 01/28/14 1013     Chief Complaint  Patient presents with  . Chest Pain     (Consider location/radiation/quality/duration/timing/severity/associated sxs/prior Treatment) The history is provided by the patient. No language interpreter was used.  Emily Andrews is a 79 y/o F with PMhx of paroxsymal Afib on chronic anticoagulation, DM, HLD, anxiety presenting to the ED with chest pain and left arm numbness/tingling that started this morning. Patient reported that she was in the kitchen making breakfast when she had sudden onset of chest pain localized to the center and left side of the chest described as a squeezing sensation with radiation down the left arm with associated numbness and tingling sensation. Patient reported that the pain lasted approximately 1 hour. Stated that EMS was called - en route to the ED stated that she was given Nitro SL and ASA 324 mg - stated that pain has now resolved. As per EMS report stated that patient was in Afib with a heart rate of 130-140 when EMS arrived - with medications patient's heart rate and rhythm resolved. Denied PM. Denied dizziness, syncope, diaphoresis, shortness of breath, difficulty breathing, leg swelling, travel, blurred vision, sudden loss of vision, headache, changes in medication, nausea, vomiting, diarrhea, nasal congestion. PCP Dr. Maceo Pro Cardiologist Dr. Marigene Ehlers  Past Medical History  Diagnosis Date  . Environmental allergies   . Hiatal hernia   . Arthritis     a. R>L knees  . Diabetes mellitus   . Anxiety   . Paroxysmal atrial fibrillation     a. Dx in 2004.  Off coumadin x several yrs;  b. Normal Event Monitor 03/2011.  Marland Kitchen Hyperlipidemia     a. statin intolerant - myalgias   Past Surgical History  Procedure Laterality Date  . Cholecystectomy    . Abdominal hysterectomy    . Lipoma tumor removed      right shoulder   . Cystectomy      lower back   Family History  Problem Relation Age of Onset  . Liver disease Mother     cirrhosis - died in 41's  . Heart failure Mother   . Stroke Father     died in 79's  . Coronary artery disease Sister     living  . Coronary artery disease Brother     living   History  Substance Use Topics  . Smoking status: Never Smoker   . Smokeless tobacco: Never Used  . Alcohol Use: No   OB History   Grav Para Term Preterm Abortions TAB SAB Ect Mult Living                 Review of Systems  Constitutional: Negative for fever, chills and diaphoresis.  Respiratory: Positive for chest tightness and shortness of breath.   Cardiovascular: Positive for chest pain.  Gastrointestinal: Negative for nausea, vomiting, abdominal pain and diarrhea.  Musculoskeletal: Positive for arthralgias (Left arm pain). Negative for back pain and neck pain.  Neurological: Positive for numbness (left arm). Negative for dizziness, weakness and headaches.      Allergies  Celebrex; Clindamycin/lincomycin; Methocarbamol; Penicillins; Promethazine hcl; Statins; and Adhesive  Home Medications   Prior to Admission medications   Medication Sig Start Date End Date Taking? Authorizing Provider  Calcium Carbonate-Vitamin D (CALTRATE 600+D PO) Take 2 tablets by mouth daily.    Yes Historical Provider, MD  chlorthalidone (HYGROTON)  25 MG tablet Take 12.5 mg by mouth daily.   Yes Historical Provider, MD  cyanocobalamin 100 MCG tablet Take 100 mcg by mouth daily.     Yes Historical Provider, MD  flecainide (TAMBOCOR) 50 MG tablet Take 1 tablet (50 mg total) by mouth 2 (two) times daily. 09/27/13  Yes Larey Dresser, MD  losartan (COZAAR) 100 MG tablet Take 100 mg by mouth daily.   Yes Historical Provider, MD  Magnesium 250 MG TABS Take by mouth 2 (two) times daily. 08/12/11  Yes Larey Dresser, MD  metoprolol tartrate (LOPRESSOR) 25 MG tablet Take one tablet twice a day--you can take one-half 50mg   tablet twice a day 08/12/11  Yes Larey Dresser, MD  Multiple Vitamins-Minerals (CENTRUM PO) Take 1 tablet by mouth daily.     Yes Historical Provider, MD  Omega-3 Fatty Acids (FISH OIL PO) Take 1 tablet by mouth daily.   Yes Historical Provider, MD  omeprazole (PRILOSEC) 20 MG capsule Take 1 capsule (20 mg total) by mouth daily. 08/12/11  Yes Larey Dresser, MD  potassium chloride SA (KLOR-CON M20) 20 MEQ tablet Take 1 tablet (20 mEq total) by mouth daily. 09/27/13  Yes Larey Dresser, MD  warfarin (COUMADIN) 5 MG tablet Take 2.5-5 mg by mouth daily. Takes 2.5 mg on Tuesday and Saturday and 5 mg all other days 11/28/13  Yes Larey Dresser, MD   BP 124/63  Pulse 67  Temp(Src) 98.3 F (36.8 C) (Oral)  Resp 16  Ht 5\' 4"  (1.626 m)  Wt 169 lb (76.658 kg)  BMI 28.99 kg/m2  SpO2 99% Physical Exam  Nursing note and vitals reviewed. Constitutional: She is oriented to person, place, and time. She appears well-developed and well-nourished. No distress.  HENT:  Head: Normocephalic and atraumatic.  Mouth/Throat: Oropharynx is clear and moist. No oropharyngeal exudate.  Eyes: Conjunctivae and EOM are normal. Pupils are equal, round, and reactive to light. Right eye exhibits no discharge. Left eye exhibits no discharge.  Neck: Normal range of motion. Neck supple. No tracheal deviation present.  Cardiovascular: Normal rate, regular rhythm and normal heart sounds.  Exam reveals no friction rub.   No murmur heard. Cap refill < 3 seconds Negative swelling or pitting edema noted to the lower extremities bilaterally   Pulmonary/Chest: Effort normal and breath sounds normal. No respiratory distress. She has no wheezes. She has no rales. She exhibits no tenderness.  Patient is able to speak in full sentences without difficulty Negative use of accessory muscles Negative stridor  Abdominal: Soft. Bowel sounds are normal. She exhibits no distension. There is no tenderness. There is no rebound and no guarding.   Musculoskeletal: Normal range of motion.  Full ROM to upper and lower extremities without difficulty noted, negative ataxia noted.  Lymphadenopathy:    She has no cervical adenopathy.  Neurological: She is alert and oriented to person, place, and time. No cranial nerve deficit. She exhibits normal muscle tone. Coordination normal.  Cranial nerves III-XII grossly intact Strength 5+/5+ to upper and lower extremities bilaterally with resistance applied, equal distribution noted Equal grip strength  Negative facial drooping Negative slurred speech  Negative aphasia Negative arm drift Fine motor skills intact Patient follows commands well  Patient responds to questions appropriately   Skin: Skin is warm and dry. No rash noted. She is not diaphoretic. No erythema.  Psychiatric: She has a normal mood and affect. Her behavior is normal. Thought content normal.    ED Course  Procedures (including critical care time)  Results for orders placed during the hospital encounter of 01/28/14  CBC WITH DIFFERENTIAL      Result Value Ref Range   WBC 5.3  4.0 - 10.5 K/uL   RBC 4.50  3.87 - 5.11 MIL/uL   Hemoglobin 14.3  12.0 - 15.0 g/dL   HCT 41.8  36.0 - 46.0 %   MCV 92.9  78.0 - 100.0 fL   MCH 31.8  26.0 - 34.0 pg   MCHC 34.2  30.0 - 36.0 g/dL   RDW 13.7  11.5 - 15.5 %   Platelets 200  150 - 400 K/uL   Neutrophils Relative % 49  43 - 77 %   Neutro Abs 2.7  1.7 - 7.7 K/uL   Lymphocytes Relative 39  12 - 46 %   Lymphs Abs 2.1  0.7 - 4.0 K/uL   Monocytes Relative 8  3 - 12 %   Monocytes Absolute 0.4  0.1 - 1.0 K/uL   Eosinophils Relative 3  0 - 5 %   Eosinophils Absolute 0.1  0.0 - 0.7 K/uL   Basophils Relative 1  0 - 1 %   Basophils Absolute 0.0  0.0 - 0.1 K/uL  COMPREHENSIVE METABOLIC PANEL      Result Value Ref Range   Sodium 139  137 - 147 mEq/L   Potassium 3.8  3.7 - 5.3 mEq/L   Chloride 102  96 - 112 mEq/L   CO2 21  19 - 32 mEq/L   Glucose, Bld 165 (*) 70 - 99 mg/dL   BUN 20  6  - 23 mg/dL   Creatinine, Ser 0.67  0.50 - 1.10 mg/dL   Calcium 9.5  8.4 - 10.5 mg/dL   Total Protein 6.9  6.0 - 8.3 g/dL   Albumin 3.5  3.5 - 5.2 g/dL   AST 23  0 - 37 U/L   ALT 17  0 - 35 U/L   Alkaline Phosphatase 56  39 - 117 U/L   Total Bilirubin 0.8  0.3 - 1.2 mg/dL   GFR calc non Af Amer 83 (*) >90 mL/min   GFR calc Af Amer >90  >90 mL/min   Anion gap 16 (*) 5 - 15  TROPONIN I      Result Value Ref Range   Troponin I <0.30  <0.30 ng/mL  PROTIME-INR      Result Value Ref Range   Prothrombin Time 24.0 (*) 11.6 - 15.2 seconds   INR 2.13 (*) 0.00 - 1.49  TROPONIN I      Result Value Ref Range   Troponin I <0.30  <0.30 ng/mL    Labs Review Labs Reviewed  COMPREHENSIVE METABOLIC PANEL - Abnormal; Notable for the following:    Glucose, Bld 165 (*)    GFR calc non Af Amer 83 (*)    Anion gap 16 (*)    All other components within normal limits  PROTIME-INR - Abnormal; Notable for the following:    Prothrombin Time 24.0 (*)    INR 2.13 (*)    All other components within normal limits  CBC WITH DIFFERENTIAL  TROPONIN I  TROPONIN I    Imaging Review Dg Chest 2 View  01/28/2014   CLINICAL DATA:  Sternal region chest pain, acute  EXAM: CHEST  2 VIEW  COMPARISON:  October 27, 2012  FINDINGS: There is slight scarring in the left base. Elsewhere lungs are clear. Heart size and pulmonary vascularity are normal. No  adenopathy. 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 There is mild degenerative change in the thoracic spine.  IMPRESSION: Mild scarring left base.  No edema or consolidation.   Electronically Signed   By: Lowella Grip M.D.   On: 01/28/2014 11:27     EKG Interpretation   Date/Time:  Tuesday January 28 2014 10:22:26 EDT Ventricular Rate:  74 PR Interval:  226 QRS Duration: 107 QT Interval:  391 QTC Calculation: 434 R Axis:   -12 Text Interpretation:  Sinus rhythm Prolonged PR interval Inferior infarct,  old Anterior  infarct, old No significant change since 2013 Confirmed by  WARD,  DO, KRISTEN ([id number]) on 01/28/2014 10:29:51 AM     11:09 AM This provider spoke with Anda Kraft from cardiology-discussed history, case, labs, ED course in great detail. Cardiology to come and assess patient.  12:37 PM Cardiology at bedside assessing patient.   12:47 PM This provider spoke with Cardiology, Dr. Tamala Julian - reported that he feels comfortable having the patient discharged home and to be followed up with Dr. Marigene Ehlers in the office. Reported that no medications would need to be changed - patient sitting comfortably in sinus rhythm with regular heart rate.   MDM   Final diagnoses:  Chest pain  Paroxysmal a-fib    Medications  sodium chloride 0.9 % bolus 500 mL (0 mLs Intravenous Stopped 01/28/14 1400)    Filed Vitals:   01/28/14 1330 01/28/14 1345 01/28/14 1415 01/28/14 1514  BP: 126/48 118/55 132/58 124/63  Pulse: 66 63  67  Temp:    98.3 F (36.8 C)  TempSrc:    Oral  Resp: 17 19 18 16   Height:      Weight:      SpO2: 97% 97% 98% 99%   This provider reviewed rhythm strip tracings that identified atrial flutter with RVR with a heart rate ranging 1:30-140. Upon arrival to the emergency department patient was back in sinus rhythm with a heart rate of 84 bpm. EKG noted normal sinus with a heart rate of 74 bpm-prolonged PR interval again noted-negative change since last tracing. Troponin negative elevation. Second troponin negative elevation. PT 24, INR 2.13-patient on Coumadin. CBC unremarkable. CMP unremarkable - glucose 165 with mildly elevated anion gap 16.0 mEq/L-bicarbonate within normal limits-doubt DKA. Chest x-ray unremarkable. Cardiology to assess patient. Patient seen and assessed by cardiology-cardiology agrees to plan of discharge. Patient to be discharged home. Patient has follow-up with cardiologist in the beginning of November. Patient's been monitored in the ED setting for 5 hours with no change -  patient continues to remain in normal sinus rhythm with a heart rate of 63-67 bpm. patient does have known history of episodic atrial fibrillation. Patient feels comfortable as well as family feels comfortable to bring patient home. Patient stable, afebrile. Patient had septic appearing. Discharged patient. Discussed the patient to rest and stay hydrated. Discussed with patient to continue to take at home medications as prescribed. Discussed with patient to follow-up primary care provider and keep appointment with cardiology. Discussed with patient to closely monitor symptoms and if symptoms are to worsen or change to report back to the ED - strict return instructions given.  Patient agreed to plan of care, understood, all questions answered.   Jamse Mead, PA-C 01/28/14 1548

## 2014-01-28 NOTE — ED Provider Notes (Signed)
Medical screening examination/treatment/procedure(s) were conducted as a shared visit with non-physician practitioner(s) and myself.  I personally evaluated the patient during the encounter.   EKG Interpretation   Date/Time:  Tuesday January 28 2014 10:22:26 EDT Ventricular Rate:  74 PR Interval:  226 QRS Duration: 107 QT Interval:  391 QTC Calculation: 434 R Axis:   -12 Text Interpretation:  Sinus rhythm Prolonged PR interval Inferior infarct,  old Anterior infarct, old No significant change since 2013 Confirmed by  Brixton Franko,  DO, Aliesha Dolata ([id number]) on 01/28/2014 10:29:51 AM      Pt is a 79 y.o. F with history of paroxysmal atrial fibrillation, diabetes who presented to the emergency department with intermittent episodes of chest tightness and palpitations. Patient was in proximal atrial fibrillation. She is found in normal sinus rhythm and is asymptomatic. Workup in the ED has been unremarkable. Cardiology has seen patient and agreed that she can be discharged home.  [place], DO 01/28/14 1254

## 2014-01-28 NOTE — ED Notes (Signed)
PA Marissa at bedside.

## 2014-02-07 ENCOUNTER — Other Ambulatory Visit: Payer: Self-pay | Admitting: *Deleted

## 2014-02-07 ENCOUNTER — Encounter: Payer: Self-pay | Admitting: *Deleted

## 2014-02-10 ENCOUNTER — Encounter: Payer: Self-pay | Admitting: Cardiology

## 2014-02-10 ENCOUNTER — Ambulatory Visit (INDEPENDENT_AMBULATORY_CARE_PROVIDER_SITE_OTHER): Payer: Medicare Other | Admitting: Cardiology

## 2014-02-10 VITALS — BP 148/90 | HR 68 | Ht 64.0 in | Wt 177.8 lb

## 2014-02-10 DIAGNOSIS — I10 Essential (primary) hypertension: Secondary | ICD-10-CM

## 2014-02-10 DIAGNOSIS — I480 Paroxysmal atrial fibrillation: Secondary | ICD-10-CM

## 2014-02-10 MED ORDER — FLECAINIDE ACETATE 50 MG PO TABS: ORAL_TABLET | ORAL | Status: DC

## 2014-02-10 NOTE — Patient Instructions (Signed)
Increase flecainide to 75mg  two times a day.  This will be 1 and 1/2 of your 50mg  tablets two times a day.  Your physician wants you to follow-up in: 6 months with Dr Aundra Dubin. (May 2016).  You will receive a reminder letter in the mail two months in advance. If you don't receive a letter, please call our office to schedule the follow-up appointment.

## 2014-02-10 NOTE — Progress Notes (Signed)
Patient ID: Emily Andrews, female   DOB: 10/13/1934, 79 y.o.   MRN: [id number] PCP: Dr. Maceo Pro  79 yo with history of paroxysmal atrial fibrillation presents for cardiology followup.  She was admitted in 3/13 with very symptomatic atrial fibrillation/RVR.  She went back into NSR on diltiazem gtt overnight.  Initially, I started her on dronedarone and Xarelto.  These medications were both too expensive so she was transitioned to flecainide and coumadin, which she is on currently.  No exertional dyspnea or chest pain. Last month (10/15), she had a run of tachypalpitations associated with shoulder pain.  She went to the ER and was noted to be in atrial fibrillation with RVR.  She converted back to NSR after taking her flecainide.  She has, of note, been under a lot of stress with her husband's health problems. She is in NSR today.  BP is high today, but she checks often at home and it is typically < 140/90.   Labs (5/12): K 4.2, creatinine 0.5, LDL 125, HDL 46 Labs (7/12): K 4.2, creatinine 0.5, AST 63, ALT 64, HCT 43.3 Labs (3/13): LDL 85, HDL 63, cardiac enzymes negative, K 3.8, creatinine 0.64 Labs (11/13): K 4.1, creatinine 0.7 Labs (5/14): K 3.8, creatinine 0.7, HCT 42.9 Labs (11/14): K 4.1, creatinine 0.8, HCT 42 Labs (10/15): K 3.8, creatinine 0.67, HCT 41.8  PMH: 1. Paroxysmal atrial fibrillation:  Echo (3/13) with EF 55-60%, mild MR. 2. Hyperlipidemia: Myalgias and leg weakness with pravastatin (very severe symptoms).  3. HTN 4. Osteoarthritis R > L knee 5. Lexiscan myoview (4/13): EF 78%, no ischemia or infarction.   SH: Lives in [place] with husband.  Nonsmoker.    FH: No premature CAD.   ROS: All systems reviewed and negative except as per HPI.   Current Outpatient Prescriptions  Medication Sig Dispense Refill  . Calcium Carbonate-Vitamin D (CALTRATE 600+D PO) Take 2 tablets by mouth daily.     . chlorthalidone (HYGROTON) 25 MG tablet Take 12.5 mg by mouth daily.    .  cyanocobalamin 100 MCG tablet Take 100 mcg by mouth daily.      Marland Kitchen losartan (COZAAR) 100 MG tablet Take 100 mg by mouth daily.    . Magnesium 250 MG TABS Take by mouth 2 (two) times daily.  0  . metoprolol tartrate (LOPRESSOR) 25 MG tablet Take one tablet twice a day--you can take one-half 50mg  tablet twice a day    . Multiple Vitamins-Minerals (CENTRUM PO) Take 1 tablet by mouth daily.      . Omega-3 Fatty Acids (FISH OIL PO) Take 1 tablet by mouth daily.    Marland Kitchen omeprazole (PRILOSEC) 20 MG capsule Take 1 capsule (20 mg total) by mouth daily.    . potassium chloride SA (KLOR-CON M20) 20 MEQ tablet Take 1 tablet (20 mEq total) by mouth daily. 30 tablet 6  . warfarin (COUMADIN) 5 MG tablet Take 2.5-5 mg by mouth daily. Takes 2.5 mg on Tuesday and Saturday and 5 mg all other days    . flecainide (TAMBOCOR) 50 MG tablet 1 and 1/2 tablets (75mg ) two times a day 90 tablet 6   No current facility-administered medications for this visit.    BP 148/90 mmHg  Pulse 68  Ht 5\' 4"  (1.626 m)  Wt 177 lb 12.8 oz (80.65 kg)  BMI 30.50 kg/m2 General: NAD Neck: No JVD, no thyromegaly or thyroid nodule.  Lungs: Clear to auscultation bilaterally with normal respiratory effort. CV: Nondisplaced PMI.  Heart regular  S1/S2, no S3/S4, no murmur.  No peripheral edema.  No carotid bruit.  Normal pedal pulses. Varicosities in lower legs bilaterally.  Abdomen: Soft, nontender, no hepatosplenomegaly, no distention.  Neurologic: Alert and oriented x 3.  Psych: Normal affect. Extremities: No clubbing or cyanosis.   Assessment/Plan: 1. Atrial fibrillation: Breakthrough symptomatic atrial fibrillation with RVR recently.   - Increase flecainide to 75 mg bid.   - Continue metoprolol and warfarin.  2. HTN: BP has been ok on current regimen with chlorthalidone, losartan, and metoprolol.  High today in office but has been normal at home.   Loralie Champagne 02/10/2014

## 2014-02-11 ENCOUNTER — Telehealth: Payer: Self-pay | Admitting: Cardiology

## 2014-02-11 DIAGNOSIS — I480 Paroxysmal atrial fibrillation: Secondary | ICD-10-CM

## 2014-02-11 MED ORDER — FLECAINIDE ACETATE 50 MG PO TABS: ORAL_TABLET | ORAL | Status: DC

## 2014-02-11 NOTE — Telephone Encounter (Signed)
New message     Talk to Emily Andrews regarding her medication

## 2014-02-11 NOTE — Telephone Encounter (Signed)
Spoke with patient about new flecainide prescription, she requests prescription to [facility].

## 2014-02-12 ENCOUNTER — Other Ambulatory Visit: Payer: Self-pay

## 2014-02-12 DIAGNOSIS — I480 Paroxysmal atrial fibrillation: Secondary | ICD-10-CM

## 2014-02-12 MED ORDER — FLECAINIDE ACETATE 50 MG PO TABS: ORAL_TABLET | ORAL | Status: DC

## 2014-03-10 ENCOUNTER — Ambulatory Visit (INDEPENDENT_AMBULATORY_CARE_PROVIDER_SITE_OTHER): Payer: Medicare Other

## 2014-03-10 DIAGNOSIS — Z7901 Long term (current) use of anticoagulants: Secondary | ICD-10-CM

## 2014-03-10 DIAGNOSIS — I4891 Unspecified atrial fibrillation: Secondary | ICD-10-CM

## 2014-03-10 DIAGNOSIS — Z5181 Encounter for therapeutic drug level monitoring: Secondary | ICD-10-CM

## 2014-03-10 LAB — POCT INR: INR: 2.6

## 2014-04-21 ENCOUNTER — Ambulatory Visit (INDEPENDENT_AMBULATORY_CARE_PROVIDER_SITE_OTHER): Payer: Medicare Other

## 2014-04-21 DIAGNOSIS — Z7901 Long term (current) use of anticoagulants: Secondary | ICD-10-CM

## 2014-04-21 DIAGNOSIS — I4891 Unspecified atrial fibrillation: Secondary | ICD-10-CM

## 2014-04-21 DIAGNOSIS — Z5181 Encounter for therapeutic drug level monitoring: Secondary | ICD-10-CM

## 2014-04-21 LAB — POCT INR: INR: 2

## 2014-04-23 ENCOUNTER — Other Ambulatory Visit: Payer: Self-pay | Admitting: Cardiology

## 2014-05-15 ENCOUNTER — Other Ambulatory Visit: Payer: Self-pay | Admitting: Cardiology

## 2014-05-19 ENCOUNTER — Other Ambulatory Visit: Payer: Self-pay | Admitting: Nurse Practitioner

## 2014-05-19 MED ORDER — CHLORTHALIDONE 25 MG PO TABS: 12.5000 mg | ORAL_TABLET | Freq: Every day | ORAL | Status: DC

## 2014-06-02 ENCOUNTER — Ambulatory Visit (INDEPENDENT_AMBULATORY_CARE_PROVIDER_SITE_OTHER): Payer: Medicare Other | Admitting: *Deleted

## 2014-06-02 DIAGNOSIS — I4891 Unspecified atrial fibrillation: Secondary | ICD-10-CM

## 2014-06-02 DIAGNOSIS — Z5181 Encounter for therapeutic drug level monitoring: Secondary | ICD-10-CM

## 2014-06-02 DIAGNOSIS — Z7901 Long term (current) use of anticoagulants: Secondary | ICD-10-CM

## 2014-06-02 LAB — POCT INR: INR: 1.9

## 2014-06-30 ENCOUNTER — Ambulatory Visit (INDEPENDENT_AMBULATORY_CARE_PROVIDER_SITE_OTHER): Payer: Medicare Other | Admitting: *Deleted

## 2014-06-30 DIAGNOSIS — Z7901 Long term (current) use of anticoagulants: Secondary | ICD-10-CM

## 2014-06-30 DIAGNOSIS — I4891 Unspecified atrial fibrillation: Secondary | ICD-10-CM | POA: Diagnosis not present

## 2014-06-30 DIAGNOSIS — Z5181 Encounter for therapeutic drug level monitoring: Secondary | ICD-10-CM | POA: Diagnosis not present

## 2014-06-30 LAB — POCT INR: INR: 2.3

## 2014-07-29 ENCOUNTER — Ambulatory Visit (INDEPENDENT_AMBULATORY_CARE_PROVIDER_SITE_OTHER): Payer: Medicare Other

## 2014-07-29 DIAGNOSIS — Z7901 Long term (current) use of anticoagulants: Secondary | ICD-10-CM

## 2014-07-29 DIAGNOSIS — I4891 Unspecified atrial fibrillation: Secondary | ICD-10-CM

## 2014-07-29 DIAGNOSIS — Z5181 Encounter for therapeutic drug level monitoring: Secondary | ICD-10-CM

## 2014-07-29 LAB — POCT INR: INR: 2.1

## 2014-08-06 ENCOUNTER — Encounter: Payer: Self-pay | Admitting: Cardiology

## 2014-08-06 ENCOUNTER — Ambulatory Visit (INDEPENDENT_AMBULATORY_CARE_PROVIDER_SITE_OTHER): Payer: Medicare Other | Admitting: Cardiology

## 2014-08-06 VITALS — BP 164/86 | HR 72 | Ht 64.0 in | Wt 180.4 lb

## 2014-08-06 DIAGNOSIS — I480 Paroxysmal atrial fibrillation: Secondary | ICD-10-CM | POA: Diagnosis not present

## 2014-08-06 DIAGNOSIS — I10 Essential (primary) hypertension: Secondary | ICD-10-CM | POA: Diagnosis not present

## 2014-08-06 LAB — CBC WITH DIFFERENTIAL/PLATELET
BASOS PCT: 0.7 % (ref 0.0–3.0)
Basophils Absolute: 0 10*3/uL (ref 0.0–0.1)
EOS ABS: 0.2 10*3/uL (ref 0.0–0.7)
EOS PCT: 3 % (ref 0.0–5.0)
HEMATOCRIT: 41.5 % (ref 36.0–46.0)
HEMOGLOBIN: 14 g/dL (ref 12.0–15.0)
Lymphocytes Relative: 44.5 % (ref 12.0–46.0)
Lymphs Abs: 3 10*3/uL (ref 0.7–4.0)
MCHC: 33.8 g/dL (ref 30.0–36.0)
MCV: 91.5 fl (ref 78.0–100.0)
MONO ABS: 0.6 10*3/uL (ref 0.1–1.0)
Monocytes Relative: 9.4 % (ref 3.0–12.0)
NEUTROS ABS: 2.9 10*3/uL (ref 1.4–7.7)
Neutrophils Relative %: 42.4 % — ABNORMAL LOW (ref 43.0–77.0)
Platelets: 192 10*3/uL (ref 150.0–400.0)
RBC: 4.54 Mil/uL (ref 3.87–5.11)
RDW: 14.4 % (ref 11.5–15.5)
WBC: 6.8 10*3/uL (ref 4.0–10.5)

## 2014-08-06 LAB — BASIC METABOLIC PANEL
BUN: 21 mg/dL (ref 6–23)
CALCIUM: 9.5 mg/dL (ref 8.4–10.5)
CO2: 29 mEq/L (ref 19–32)
Chloride: 104 mEq/L (ref 96–112)
Creatinine, Ser: 0.84 mg/dL (ref 0.40–1.20)
GFR: 69.74 mL/min (ref >60.00)
GLUCOSE: 120 mg/dL — AB (ref 70–99)
POTASSIUM: 3.9 meq/L (ref 3.5–5.1)
SODIUM: 139 meq/L (ref 135–145)

## 2014-08-06 MED ORDER — CHLORTHALIDONE 25 MG PO TABS: 25.0000 mg | ORAL_TABLET | Freq: Every day | ORAL | Status: DC

## 2014-08-06 NOTE — Patient Instructions (Signed)
Medication Instructions:  Increase chlorthalidone to 25mg  daily  Labwork: BMET/CBCd today.  BMET in 2 weeks.  Testing/Procedures: None today  Follow-Up: Your physician wants you to follow-up in: 6 months with Dr Aundra Dubin. (November 2016).  You will receive a reminder letter in the mail two months in advance. If you don't receive a letter, please call our office to schedule the follow-up appointment.   Any Other Special Instructions Will Be Listed Below (If Applicable).    If you think you may be going in to atrial fibrillation you can take flecainide 150mg  and metoprolol 50mg  one time only. Do not take any more of these medications the rest of the day.

## 2014-08-07 ENCOUNTER — Other Ambulatory Visit: Payer: Self-pay

## 2014-08-07 DIAGNOSIS — I480 Paroxysmal atrial fibrillation: Secondary | ICD-10-CM

## 2014-08-07 DIAGNOSIS — I10 Essential (primary) hypertension: Secondary | ICD-10-CM

## 2014-08-07 MED ORDER — CHLORTHALIDONE 25 MG PO TABS: 25.0000 mg | ORAL_TABLET | Freq: Every day | ORAL | Status: AC

## 2014-08-07 MED ORDER — POTASSIUM CHLORIDE CRYS ER 20 MEQ PO TBCR: 20.0000 meq | EXTENDED_RELEASE_TABLET | Freq: Every day | ORAL | Status: DC

## 2014-08-07 NOTE — Progress Notes (Signed)
Patient ID: Emily Andrews, female   DOB: Jan 14, 1935, 80 y.o.   MRN: [id number] PCP: Dr. Maceo Pro  80 yo with history of paroxysmal atrial fibrillation presents for cardiology followup.  She was admitted in 3/13 with very symptomatic atrial fibrillation/RVR.  She went back into NSR on diltiazem gtt overnight.  Initially, I started her on dronedarone and Xarelto.  These medications were both too expensive so she was transitioned to flecainide and coumadin, which she is on currently. In 10/15, she had a run of tachypalpitations associated with shoulder pain.  She went to the ER and was noted to be in atrial fibrillation with RVR.  She converted back to NSR after taking her flecainide.  Flecainide was then increased to 75 mg bid.    Today, she is doing well.  No tachypalpitations.  No chest pain.  No exertional dyspnea.  She is primarily limited by knee pain.  BP at home has been running in the 0000000 systolic and today BP is [id number].  She is in NSR today.   Labs (5/12): K 4.2, creatinine 0.5, LDL 125, HDL 46 Labs (7/12): K 4.2, creatinine 0.5, AST 63, ALT 64, HCT 43.3 Labs (3/13): LDL 85, HDL 63, cardiac enzymes negative, K 3.8, creatinine 0.64 Labs (11/13): K 4.1, creatinine 0.7 Labs (5/14): K 3.8, creatinine 0.7, HCT 42.9 Labs (11/14): K 4.1, creatinine 0.8, HCT 42 Labs (10/15): K 3.8, creatinine 0.67, HCT 41.8  PMH: 1. Paroxysmal atrial fibrillation:  Echo (3/13) with EF 55-60%, mild MR. 2. Hyperlipidemia: Myalgias and leg weakness with pravastatin (very severe symptoms).  3. HTN 4. Osteoarthritis R > L knee 5. Lexiscan myoview (4/13): EF 78%, no ischemia or infarction.   SH: Lives in [place] with husband.  Nonsmoker.    FH: No premature CAD.   ROS: All systems reviewed and negative except as per HPI.   Current Outpatient Prescriptions  Medication Sig Dispense Refill  . Calcium Carbonate-Vitamin D (CALTRATE 600+D PO) Take 2 tablets by mouth daily.     . chlorthalidone (HYGROTON)  25 MG tablet Take 0.5 tablets (12.5 mg total) by mouth daily. 30 tablet 3  . cyanocobalamin 100 MCG tablet Take 100 mcg by mouth daily.      . flecainide (TAMBOCOR) 50 MG tablet 1 and 1/2 tablets (75mg ) two times a day 90 tablet 6  . KLOR-CON M20 20 MEQ tablet TAKE ONE TABLET BY MOUTH ONCE DAILY 30 tablet 3  . losartan (COZAAR) 100 MG tablet Take 100 mg by mouth daily.    . Magnesium 250 MG TABS Take by mouth 2 (two) times daily.  0  . metoprolol tartrate (LOPRESSOR) 25 MG tablet Take one tablet twice a day--you can take one-half 50mg  tablet twice a day    . Multiple Vitamins-Minerals (CENTRUM PO) Take 1 tablet by mouth daily.      . Omega-3 Fatty Acids (FISH OIL PO) Take 1 tablet by mouth daily.    Marland Kitchen omeprazole (PRILOSEC) 20 MG capsule Take 1 capsule (20 mg total) by mouth daily.    Marland Kitchen tiZANidine (ZANAFLEX) 2 MG tablet Take 2 mg by mouth every 8 (eight) hours as needed for muscle spasms.    Marland Kitchen warfarin (COUMADIN) 5 MG tablet TAKE AS DIRECTED BY ANTICOAGULATION CLINIC. 35 tablet 3  . chlorthalidone (HYGROTON) 25 MG tablet Take 1 tablet (25 mg total) by mouth daily. 90 tablet 1   No current facility-administered medications for this visit.    BP 164/86 mmHg  Pulse 72  Ht 5'  4" (1.626 m)  Wt 180 lb 6.4 oz (81.829 kg)  BMI 30.95 kg/m2 General: NAD Neck: No JVD, no thyromegaly or thyroid nodule.  Lungs: Clear to auscultation bilaterally with normal respiratory effort. CV: Nondisplaced PMI.  Heart regular S1/S2, no S3/S4, no murmur.  No peripheral edema.  No carotid bruit.  Normal pedal pulses. Varicosities in lower legs bilaterally.  Abdomen: Soft, nontender, no hepatosplenomegaly, no distention.  Neurologic: Alert and oriented x 3.  Psych: Normal affect. Extremities: No clubbing or cyanosis.   Assessment/Plan: 1. Atrial fibrillation: No recent symptomatic atrial fibrillation.    - Continue flecainide 75 mg bid.   - Continue metoprolol and warfarin.  - If she has breakthrough atrial  fibrillation, she can take flecainide 150 mg x 1 and metoprolol 50 mg x 1 (do not take further flecainide that day).  - CBC and BMET today.  2. HTN: BP running high.  Increase chlorthalidone to 25 mg daily with BMET in 2 weeks.   Followup in 6 months.   Loralie Champagne 08/07/2014

## 2014-08-18 ENCOUNTER — Other Ambulatory Visit: Payer: Self-pay | Admitting: Surgical

## 2014-08-18 ENCOUNTER — Other Ambulatory Visit (INDEPENDENT_AMBULATORY_CARE_PROVIDER_SITE_OTHER): Payer: Medicare Other | Admitting: *Deleted

## 2014-08-18 DIAGNOSIS — M48061 Spinal stenosis, lumbar region without neurogenic claudication: Secondary | ICD-10-CM

## 2014-08-18 DIAGNOSIS — I480 Paroxysmal atrial fibrillation: Secondary | ICD-10-CM | POA: Diagnosis not present

## 2014-08-18 DIAGNOSIS — I10 Essential (primary) hypertension: Secondary | ICD-10-CM

## 2014-08-18 DIAGNOSIS — M5126 Other intervertebral disc displacement, lumbar region: Secondary | ICD-10-CM

## 2014-08-18 LAB — BASIC METABOLIC PANEL
BUN: 22 mg/dL (ref 6–23)
CHLORIDE: 101 meq/L (ref 96–112)
CO2: 31 meq/L (ref 19–32)
CREATININE: 0.8 mg/dL (ref 0.40–1.20)
Calcium: 9.7 mg/dL (ref 8.4–10.5)
GFR: 73.77 mL/min (ref >60.00)
Glucose, Bld: 110 mg/dL — ABNORMAL HIGH (ref 70–99)
POTASSIUM: 3.6 meq/L (ref 3.5–5.1)
Sodium: 137 mEq/L (ref 135–145)

## 2014-08-18 NOTE — Addendum Note (Signed)
Addended by: Eulis Foster on: 08/18/2014 09:38 AM   Modules accepted: Orders

## 2014-08-21 ENCOUNTER — Telehealth: Payer: Self-pay | Admitting: Cardiology

## 2014-08-21 ENCOUNTER — Telehealth: Payer: Self-pay | Admitting: *Deleted

## 2014-08-21 NOTE — Telephone Encounter (Signed)
New message     Returning Anne's call

## 2014-08-21 NOTE — Telephone Encounter (Signed)
Called pt and made her aware of instructions per Dr. Aundra Dubin. Pt verbalized understanding.

## 2014-08-21 NOTE — Telephone Encounter (Signed)
Returned pt's call and informed her of lab results. Pt verbalized understanding.

## 2014-08-21 NOTE — Telephone Encounter (Signed)
Attempted to call pt to inform her that Dr. Aundra Dubin said it was fine to stop Coumadin for 4 days, restart afterwards but phone was busy.  Will try again later.

## 2014-08-21 NOTE — Telephone Encounter (Signed)
-----   Message from Larey Dresser, MD sent at 08/20/2014  3:45 PM EDT ----- Regarding: RE: Request for patient to stop Warfarin That would be fine, restart afterwards. Thanks.  ----- Message -----    From: Silvestre Gunner    Sent: 08/20/2014   8:54 AM      To: Larey Dresser, MD Subject: Request for patient to stop Warfarin           Dr. Aundra Dubin,  Your patient, Emily Andrews ([birth year]), has been referred to our office for a lumbar myelogram. We are requesting permission for her to stop Warfarin for 4 days, or until INR is 1.5 or below. The appointment will be scheduled, following your response.   Thank you,  [place] [phone] (phone) [phone] (fax)

## 2014-09-09 ENCOUNTER — Ambulatory Visit
Admission: RE | Admit: 2014-09-09 | Discharge: 2014-09-09 | Disposition: A | Discharge status: Home / Self Care | Payer: Medicare Other | Source: Ambulatory Visit | Attending: Surgical | Admitting: Surgical

## 2014-09-09 ENCOUNTER — Ambulatory Visit (INDEPENDENT_AMBULATORY_CARE_PROVIDER_SITE_OTHER): Payer: Medicare Other | Admitting: Pharmacist Clinician (PhC)/ Clinical Pharmacy Specialist

## 2014-09-09 DIAGNOSIS — Z7901 Long term (current) use of anticoagulants: Secondary | ICD-10-CM

## 2014-09-09 DIAGNOSIS — Z5181 Encounter for therapeutic drug level monitoring: Secondary | ICD-10-CM | POA: Diagnosis not present

## 2014-09-09 DIAGNOSIS — M5126 Other intervertebral disc displacement, lumbar region: Secondary | ICD-10-CM

## 2014-09-09 DIAGNOSIS — I4891 Unspecified atrial fibrillation: Secondary | ICD-10-CM | POA: Diagnosis not present

## 2014-09-09 DIAGNOSIS — M48061 Spinal stenosis, lumbar region without neurogenic claudication: Secondary | ICD-10-CM

## 2014-09-09 LAB — POCT INR: INR: 1.1

## 2014-09-09 MED ORDER — IOHEXOL 180 MG/ML  SOLN
15.0000 mL | Freq: Once | INTRAMUSCULAR | Status: AC | PRN
  Administered 2014-09-09: 15 mL via INTRAVENOUS

## 2014-09-09 MED ORDER — DIAZEPAM 5 MG PO TABS
5.0000 mg | ORAL_TABLET | Freq: Once | ORAL | Status: AC
  Administered 2014-09-09: 5 mg via ORAL

## 2014-09-09 NOTE — Progress Notes (Signed)
Pt states she has been off Warfarin for the past 5 days. Discharge instructions explained to pt and husband.

## 2014-09-09 NOTE — Discharge Instructions (Signed)
Myelogram Discharge Instructions  1. Go home and rest quietly for the next 24 hours.  It is important to lie flat for the next 24 hours.  Get up only to go to the restroom.  You may lie in the bed or on a couch on your back, your stomach, your left side or your right side.  You may have one pillow under your head.  You may have pillows between your knees while you are on your side or under your knees while you are on your back.  2. DO NOT drive today.  Recline the seat as far back as it will go, while still wearing your seat belt, on the way home.  3. You may get up to go to the bathroom as needed.  You may sit up for 10 minutes to eat.  You may resume your normal diet and medications unless otherwise indicated.  Drink lots of extra fluids today and tomorrow.  4. The incidence of headache, nausea, or vomiting is about 5% (one in 20 patients).  If you develop a headache, lie flat and drink plenty of fluids until the headache goes away.  Caffeinated beverages may be helpful.  If you develop severe nausea and vomiting or a headache that does not go away with flat bed rest, call [phone].  5. You may resume normal activities after your 24 hours of bed rest is over; however, do not exert yourself strongly or do any heavy lifting tomorrow. If when you get up you have a headache when standing, go back to bed and force fluids for another 24 hours.  6. Call your physician for a follow-up appointment.  The results of your myelogram will be sent directly to your physician by the following day.  7. If you have any questions or if complications develop after you arrive home, please call [phone].  Discharge instructions have been explained to the patient.  The patient, or the person responsible for the patient, fully understands these instructions.       May resume Warfarin today.

## 2014-09-15 ENCOUNTER — Other Ambulatory Visit: Payer: Self-pay | Admitting: Cardiology

## 2014-09-23 ENCOUNTER — Ambulatory Visit (INDEPENDENT_AMBULATORY_CARE_PROVIDER_SITE_OTHER): Payer: Medicare Other | Admitting: *Deleted

## 2014-09-23 DIAGNOSIS — Z7901 Long term (current) use of anticoagulants: Secondary | ICD-10-CM | POA: Diagnosis not present

## 2014-09-23 DIAGNOSIS — I4891 Unspecified atrial fibrillation: Secondary | ICD-10-CM

## 2014-09-23 DIAGNOSIS — Z5181 Encounter for therapeutic drug level monitoring: Secondary | ICD-10-CM

## 2014-09-23 LAB — POCT INR: INR: 3.1

## 2014-10-15 ENCOUNTER — Ambulatory Visit (INDEPENDENT_AMBULATORY_CARE_PROVIDER_SITE_OTHER): Payer: Medicare Other | Admitting: *Deleted

## 2014-10-15 DIAGNOSIS — Z7901 Long term (current) use of anticoagulants: Secondary | ICD-10-CM | POA: Diagnosis not present

## 2014-10-15 DIAGNOSIS — I4891 Unspecified atrial fibrillation: Secondary | ICD-10-CM | POA: Diagnosis not present

## 2014-10-15 DIAGNOSIS — Z5181 Encounter for therapeutic drug level monitoring: Secondary | ICD-10-CM

## 2014-10-15 LAB — POCT INR: INR: 2.4

## 2014-11-03 ENCOUNTER — Other Ambulatory Visit: Payer: Self-pay | Admitting: Cardiology

## 2014-11-11 ENCOUNTER — Ambulatory Visit (INDEPENDENT_AMBULATORY_CARE_PROVIDER_SITE_OTHER): Payer: Medicare Other | Admitting: Pharmacist Clinician (PhC)/ Clinical Pharmacy Specialist

## 2014-11-11 DIAGNOSIS — Z7901 Long term (current) use of anticoagulants: Secondary | ICD-10-CM | POA: Diagnosis not present

## 2014-11-11 DIAGNOSIS — I4891 Unspecified atrial fibrillation: Secondary | ICD-10-CM | POA: Diagnosis not present

## 2014-11-11 DIAGNOSIS — Z5181 Encounter for therapeutic drug level monitoring: Secondary | ICD-10-CM

## 2014-11-11 LAB — POCT INR: INR: 3.2

## 2014-12-04 ENCOUNTER — Ambulatory Visit (INDEPENDENT_AMBULATORY_CARE_PROVIDER_SITE_OTHER): Payer: Medicare Other | Admitting: *Deleted

## 2014-12-04 DIAGNOSIS — Z5181 Encounter for therapeutic drug level monitoring: Secondary | ICD-10-CM | POA: Diagnosis not present

## 2014-12-04 DIAGNOSIS — I4891 Unspecified atrial fibrillation: Secondary | ICD-10-CM | POA: Diagnosis not present

## 2014-12-04 DIAGNOSIS — Z7901 Long term (current) use of anticoagulants: Secondary | ICD-10-CM | POA: Diagnosis not present

## 2014-12-04 LAB — POCT INR: INR: 2.7

## 2015-01-02 ENCOUNTER — Ambulatory Visit (INDEPENDENT_AMBULATORY_CARE_PROVIDER_SITE_OTHER): Payer: Medicare Other | Admitting: *Deleted

## 2015-01-02 DIAGNOSIS — Z5181 Encounter for therapeutic drug level monitoring: Secondary | ICD-10-CM

## 2015-01-02 DIAGNOSIS — I4891 Unspecified atrial fibrillation: Secondary | ICD-10-CM | POA: Diagnosis not present

## 2015-01-02 DIAGNOSIS — Z7901 Long term (current) use of anticoagulants: Secondary | ICD-10-CM | POA: Diagnosis not present

## 2015-01-02 LAB — POCT INR: INR: 2.4

## 2015-02-03 ENCOUNTER — Ambulatory Visit (INDEPENDENT_AMBULATORY_CARE_PROVIDER_SITE_OTHER): Payer: Medicare Other | Admitting: *Deleted

## 2015-02-03 ENCOUNTER — Encounter: Payer: Self-pay | Admitting: Physician Assistant

## 2015-02-03 ENCOUNTER — Ambulatory Visit (INDEPENDENT_AMBULATORY_CARE_PROVIDER_SITE_OTHER): Payer: Medicare Other | Admitting: Physician Assistant

## 2015-02-03 VITALS — BP 147/50 | HR 48 | Ht 64.0 in | Wt 174.8 lb

## 2015-02-03 DIAGNOSIS — Z7901 Long term (current) use of anticoagulants: Secondary | ICD-10-CM | POA: Diagnosis not present

## 2015-02-03 DIAGNOSIS — I4891 Unspecified atrial fibrillation: Secondary | ICD-10-CM

## 2015-02-03 DIAGNOSIS — I10 Essential (primary) hypertension: Secondary | ICD-10-CM

## 2015-02-03 DIAGNOSIS — Z5181 Encounter for therapeutic drug level monitoring: Secondary | ICD-10-CM

## 2015-02-03 LAB — POCT INR: INR: 2.7

## 2015-02-03 NOTE — Assessment & Plan Note (Addendum)
Patient is in sinus bradycardia at 48 bpm on flecainide and metoprolol. This is similar to EKG May/2015. She has had no dizziness and doesn't feel like her heart rate too slow. She has not had any further atrial fibrillation as far she knows. She is maintained on flecainide, metoprolol and Coumadin. At work stable in May 2016. Follow up with Dr. Aundra Dubin in 6 months.

## 2015-02-03 NOTE — Assessment & Plan Note (Signed)
An appointment with Coumadin clinic today. No problems with bleeding.

## 2015-02-03 NOTE — Assessment & Plan Note (Signed)
Better controlled on increased chlorthalidone. 2 g sodium diet.

## 2015-02-03 NOTE — Patient Instructions (Signed)
Medication Instructions:  1- If you go into Atrial Fibrillation take Flecainide 150 mg by mouth once and Metoprolol 50 mg by mouth once. Do not take any more Flecainide that day.  Lab work: NONE ordered  Testing/Procedures: NONE ordered  Follow-Up: Your physician wants you to follow-up in: 6 months with Dr. Aundra Dubin. You will receive a reminder letter in the mail two months in advance. If you don't receive a letter, please call our office to schedule the follow-up appointment.   Any Other Special Instructions Will Be Listed Below (If Applicable).     If you need a refill on your cardiac medications before your next appointment, please call your pharmacy.

## 2015-02-03 NOTE — Progress Notes (Addendum)
Cardiology Office Note   Date:  02/03/2015   ID:  Emily Andrews, Emily Andrews 1934/05/12, MRN [id number]  PCP:  Orpah Melter, MD  Cardiologist: Dr. Aundra Dubin Chief Complaint:    History of Present Illness: Emily Andrews is a 80 y.o. female who presents for follow up.She has a history of paroxysmal atrial fibrillation presents for cardiology followup.  She was admitted in 3/13 with very symptomatic atrial fibrillation/RVR.  She went back into NSR on diltiazem gtt overnight.  Initially, I started her on dronedarone and Xarelto.  These medications were both too expensive so she was transitioned to flecainide and coumadin, which she is on currently. In 10/15, she had a run of tachypalpitations associated with shoulder pain.  She went to the ER and was noted to be in atrial fibrillation with RVR.  She converted back to NSR after taking her flecainide.  Flecainide was then increased to 75 mg bid.   At last office visit in May 2016 her chlorthalidone was increased to 25 mg daily for high blood pressure. Bement was checked 2 weeks later and was normal.   She comes in today feeling quite well. Sometimes she feels like she may go into atrial fibrillation but then never does. She has not had to use any extra flecainide or metoprolol. She's had a stressful year with her husband having 6 surgeries at [facility] from complications of gallbladder surgery. She remains active and tires out later in the afternoon. She denies chest pain, dyspnea, dyspnea on exertion, dizziness or presyncope.     Past Medical History  Diagnosis Date  . Environmental allergies   . Hiatal hernia   . Arthritis     a. R>L knees  . Diabetes mellitus   . Anxiety   . Paroxysmal atrial fibrillation ([place])     a. Dx in 2004.  Off coumadin x several yrs;  b. Normal Event Monitor 03/2011.  Marland Kitchen Hyperlipidemia     a. statin intolerant - myalgias    Past Surgical History  Procedure Laterality Date  . Cholecystectomy    .  Abdominal hysterectomy    . Lipoma tumor removed      right shoulder  . Cystectomy      lower back     Current Outpatient Prescriptions  Medication Sig Dispense Refill  . Calcium Carbonate-Vitamin D (CALTRATE 600+D PO) Take 2 tablets by mouth daily.     . chlorthalidone (HYGROTON) 25 MG tablet Take 1 tablet (25 mg total) by mouth daily. 90 tablet 3  . cyanocobalamin 100 MCG tablet Take 100 mcg by mouth daily.      . flecainide (TAMBOCOR) 50 MG tablet TAKE ONE AND ONE-HALF TABLETS BY MOUTH TWICE DAILY 90 tablet 5  . losartan (COZAAR) 100 MG tablet Take 100 mg by mouth daily.    . Magnesium 250 MG TABS Take by mouth 2 (two) times daily.  0  . metoprolol tartrate (LOPRESSOR) 25 MG tablet Take one tablet twice a day--you can take one-half 50mg  tablet twice a day    . Multiple Vitamins-Minerals (CENTRUM PO) Take 1 tablet by mouth daily.      . Omega-3 Fatty Acids (FISH OIL PO) Take 1 tablet by mouth daily.    Marland Kitchen omeprazole (PRILOSEC) 20 MG capsule Take 1 capsule (20 mg total) by mouth daily.    . potassium chloride SA (KLOR-CON M20) 20 MEQ tablet Take 1 tablet (20 mEq total) by mouth daily. 90 tablet 3  . warfarin (COUMADIN) 5 MG tablet TAKE  AS DIRECTED BY ANTICOAGULATION CLINIC 35 tablet 3   No current facility-administered medications for this visit.    Allergies:   Statins; Celebrex; Clindamycin/lincomycin; Penicillins; Phenergan; Robaxin; and Adhesive    Social History:  The patient  reports that she has never smoked. She has never used smokeless tobacco. She reports that she does not drink alcohol or use illicit drugs.   Family History:  The patient's family history includes Coronary artery disease in her brother and sister; Heart failure in her mother; Liver disease in her mother; Stroke in her father.    ROS:  Please see the history of present illness.   Otherwise, review of systems are positive for fatigue late in the afternoon, occasional irregular heartbeat, chronic back pain.    All other systems are reviewed and negative.    PHYSICAL EXAM: VS:  BP 147/50 mmHg  Pulse 48  Ht 5\' 4"  (1.626 m)  Wt 174 lb 12.8 oz (79.289 kg)  BMI 29.99 kg/m2 , BMI Body mass index is 29.99 kg/(m^2). GEN: Well nourished, well developed, in no acute distress Neck: no JVD, HJR, carotid bruits, or masses Cardiac: RRR; no murmurs,gallop, rubs, thrill or heave,  Respiratory:  clear to auscultation bilaterally, normal work of breathing GI: soft, nontender, nondistended, + BS MS: no deformity or atrophy Extremities: without cyanosis, clubbing, edema, good distal pulses bilaterally.  Skin: warm and dry, no rash Neuro:  Strength and sensation are intact    EKG:  EKG is ordered today. The ekg ordered today demonstrates sinus bradycardia at 48 bpm with first-degree AV block, poor R wave progression anteriorly. Similar to EKG in 08/2013   Recent Labs: 08/06/2014: Hemoglobin 14.0; Platelets 192.0 08/18/2014: BUN 22; Creatinine, Ser 0.80; Potassium 3.6; Sodium 137    Lipid Panel    Component Value Date/Time   CHOL 162 06/28/2011 0210   TRIG 69 06/28/2011 0210   HDL 63 06/28/2011 0210   CHOLHDL 2.6 06/28/2011 0210   VLDL 14 06/28/2011 0210   LDLCALC 85 06/28/2011 0210      Wt Readings from Last 3 Encounters:  02/03/15 174 lb 12.8 oz (79.289 kg)  08/06/14 180 lb 6.4 oz (81.829 kg)  02/10/14 177 lb 12.8 oz (80.65 kg)      Other studies Reviewed: Additional studies/ records that were reviewed today include and review of the records demonstrates:  Labs reviewed from May were stable.   ASSESSMENT AND PLAN: Atrial fibrillation Patient is in sinus bradycardia at 48 bpm on flecainide and metoprolol. This is similar to EKG May/2015. She has had no dizziness and doesn't feel like her heart rate too slow. She has not had any further atrial fibrillation as far she knows. She is maintained on flecainide, metoprolol and Coumadin. At work stable in May 2016. Follow up with Dr. Aundra Dubin in 6  months.  Hypertension Better controlled on increased chlorthalidone. 2 g sodium diet.  Encounter for long-term (current) use of anticoagulants An appointment with Coumadin clinic today. No problems with bleeding.      Sumner Boast, PA-C  02/03/2015 8:44 AM    [facility] [address], [place], [place]  [address] Phone: [phone]; Fax: [phone]

## 2015-03-16 ENCOUNTER — Other Ambulatory Visit: Payer: Self-pay | Admitting: Cardiology

## 2015-03-17 ENCOUNTER — Ambulatory Visit (INDEPENDENT_AMBULATORY_CARE_PROVIDER_SITE_OTHER): Payer: Medicare Other | Admitting: *Deleted

## 2015-03-17 DIAGNOSIS — Z7901 Long term (current) use of anticoagulants: Secondary | ICD-10-CM

## 2015-03-17 DIAGNOSIS — I4891 Unspecified atrial fibrillation: Secondary | ICD-10-CM

## 2015-03-17 DIAGNOSIS — Z5181 Encounter for therapeutic drug level monitoring: Secondary | ICD-10-CM

## 2015-03-17 LAB — POCT INR: INR: 2.1

## 2015-04-08 ENCOUNTER — Telehealth: Payer: Self-pay | Admitting: Cardiology

## 2015-04-08 NOTE — Telephone Encounter (Signed)
Pt advised chlorthalidone increased to 25mg  daily by Dr Aundra Dubin May 2016, she should be taking chlorthalidone 25mg  daily.

## 2015-04-08 NOTE — Telephone Encounter (Signed)
New message      Pt c/o medication issue:  1. Name of Medication: chlorthalidone 2. How are you currently taking this medication (dosage and times per day)? 25mg  daily 3. Are you having a reaction (difficulty breathing--STAT)? no 4. What is your medication issue? Pt is taking 1 tablet daily.  She has a printout saying take 1/2 tablet daily.  Which one is she to be taking?

## 2015-04-27 ENCOUNTER — Ambulatory Visit (INDEPENDENT_AMBULATORY_CARE_PROVIDER_SITE_OTHER): Payer: Medicare Other | Admitting: *Deleted

## 2015-04-27 DIAGNOSIS — Z7901 Long term (current) use of anticoagulants: Secondary | ICD-10-CM | POA: Diagnosis not present

## 2015-04-27 DIAGNOSIS — I4891 Unspecified atrial fibrillation: Secondary | ICD-10-CM | POA: Diagnosis not present

## 2015-04-27 DIAGNOSIS — Z5181 Encounter for therapeutic drug level monitoring: Secondary | ICD-10-CM

## 2015-04-27 LAB — POCT INR: INR: 2.6

## 2015-04-27 MED ORDER — WARFARIN SODIUM 5 MG PO TABS: ORAL_TABLET | ORAL | Status: DC

## 2015-04-28 ENCOUNTER — Other Ambulatory Visit: Payer: Self-pay | Admitting: Cardiology

## 2015-06-08 ENCOUNTER — Ambulatory Visit (INDEPENDENT_AMBULATORY_CARE_PROVIDER_SITE_OTHER): Payer: Medicare Other | Admitting: *Deleted

## 2015-06-08 DIAGNOSIS — Z5181 Encounter for therapeutic drug level monitoring: Secondary | ICD-10-CM | POA: Diagnosis not present

## 2015-06-08 DIAGNOSIS — I4891 Unspecified atrial fibrillation: Secondary | ICD-10-CM | POA: Diagnosis not present

## 2015-06-08 DIAGNOSIS — Z7901 Long term (current) use of anticoagulants: Secondary | ICD-10-CM

## 2015-06-08 LAB — POCT INR: INR: 2

## 2015-07-20 ENCOUNTER — Ambulatory Visit (INDEPENDENT_AMBULATORY_CARE_PROVIDER_SITE_OTHER): Payer: Medicare Other | Admitting: *Deleted

## 2015-07-20 DIAGNOSIS — Z7901 Long term (current) use of anticoagulants: Secondary | ICD-10-CM | POA: Diagnosis not present

## 2015-07-20 DIAGNOSIS — I4891 Unspecified atrial fibrillation: Secondary | ICD-10-CM

## 2015-07-20 DIAGNOSIS — Z5181 Encounter for therapeutic drug level monitoring: Secondary | ICD-10-CM | POA: Diagnosis not present

## 2015-07-20 LAB — POCT INR: INR: 2.4

## 2015-08-11 ENCOUNTER — Other Ambulatory Visit: Payer: Self-pay | Admitting: Cardiology

## 2015-08-21 ENCOUNTER — Ambulatory Visit (INDEPENDENT_AMBULATORY_CARE_PROVIDER_SITE_OTHER): Payer: Medicare Other | Admitting: *Deleted

## 2015-08-21 DIAGNOSIS — Z5181 Encounter for therapeutic drug level monitoring: Secondary | ICD-10-CM | POA: Diagnosis not present

## 2015-08-21 DIAGNOSIS — I4891 Unspecified atrial fibrillation: Secondary | ICD-10-CM | POA: Diagnosis not present

## 2015-08-21 DIAGNOSIS — Z7901 Long term (current) use of anticoagulants: Secondary | ICD-10-CM

## 2015-08-21 LAB — POCT INR: INR: 2.2

## 2015-09-14 ENCOUNTER — Other Ambulatory Visit: Payer: Self-pay | Admitting: Cardiology

## 2015-10-02 ENCOUNTER — Ambulatory Visit (INDEPENDENT_AMBULATORY_CARE_PROVIDER_SITE_OTHER): Payer: Medicare Other | Admitting: *Deleted

## 2015-10-02 DIAGNOSIS — Z7901 Long term (current) use of anticoagulants: Secondary | ICD-10-CM | POA: Diagnosis not present

## 2015-10-02 DIAGNOSIS — Z5181 Encounter for therapeutic drug level monitoring: Secondary | ICD-10-CM | POA: Diagnosis not present

## 2015-10-02 DIAGNOSIS — I4891 Unspecified atrial fibrillation: Secondary | ICD-10-CM | POA: Diagnosis not present

## 2015-10-02 LAB — POCT INR: INR: 2.1

## 2015-10-02 MED ORDER — WARFARIN SODIUM 5 MG PO TABS: ORAL_TABLET | ORAL | Status: DC

## 2015-11-13 ENCOUNTER — Ambulatory Visit (INDEPENDENT_AMBULATORY_CARE_PROVIDER_SITE_OTHER): Payer: Medicare Other | Admitting: *Deleted

## 2015-11-13 DIAGNOSIS — Z5181 Encounter for therapeutic drug level monitoring: Secondary | ICD-10-CM | POA: Diagnosis not present

## 2015-11-13 DIAGNOSIS — Z7901 Long term (current) use of anticoagulants: Secondary | ICD-10-CM

## 2015-11-13 DIAGNOSIS — I4891 Unspecified atrial fibrillation: Secondary | ICD-10-CM

## 2015-11-13 LAB — POCT INR: INR: 1.5

## 2015-11-20 ENCOUNTER — Encounter: Payer: Self-pay | Admitting: Cardiology

## 2015-11-27 ENCOUNTER — Ambulatory Visit (INDEPENDENT_AMBULATORY_CARE_PROVIDER_SITE_OTHER): Payer: Medicare Other | Admitting: Pharmacist

## 2015-11-27 ENCOUNTER — Encounter (INDEPENDENT_AMBULATORY_CARE_PROVIDER_SITE_OTHER): Payer: Self-pay

## 2015-11-27 DIAGNOSIS — Z7901 Long term (current) use of anticoagulants: Secondary | ICD-10-CM

## 2015-11-27 DIAGNOSIS — I4891 Unspecified atrial fibrillation: Secondary | ICD-10-CM | POA: Diagnosis not present

## 2015-11-27 DIAGNOSIS — Z5181 Encounter for therapeutic drug level monitoring: Secondary | ICD-10-CM

## 2015-11-27 LAB — POCT INR: INR: 2.6

## 2015-12-04 ENCOUNTER — Ambulatory Visit (INDEPENDENT_AMBULATORY_CARE_PROVIDER_SITE_OTHER): Payer: Medicare Other | Admitting: Cardiology

## 2015-12-04 ENCOUNTER — Encounter: Payer: Self-pay | Admitting: Cardiology

## 2015-12-04 ENCOUNTER — Encounter (INDEPENDENT_AMBULATORY_CARE_PROVIDER_SITE_OTHER): Payer: Self-pay

## 2015-12-04 VITALS — BP 132/72 | HR 55 | Ht 64.0 in | Wt 175.0 lb

## 2015-12-04 DIAGNOSIS — I480 Paroxysmal atrial fibrillation: Secondary | ICD-10-CM

## 2015-12-04 DIAGNOSIS — I10 Essential (primary) hypertension: Secondary | ICD-10-CM | POA: Diagnosis not present

## 2015-12-04 DIAGNOSIS — I739 Peripheral vascular disease, unspecified: Secondary | ICD-10-CM | POA: Diagnosis not present

## 2015-12-04 DIAGNOSIS — Z79899 Other long term (current) drug therapy: Secondary | ICD-10-CM | POA: Diagnosis not present

## 2015-12-04 LAB — CBC WITH DIFFERENTIAL/PLATELET
BASOS PCT: 1 %
Basophils Absolute: 69 cells/uL (ref 0–200)
Eosinophils Absolute: 138 cells/uL (ref 15–500)
Eosinophils Relative: 2 %
HCT: 42.5 % (ref 35.0–45.0)
Hemoglobin: 14 g/dL (ref 11.7–15.5)
LYMPHS PCT: 50 %
Lymphs Abs: 3450 cells/uL (ref 850–3900)
MCH: 30.7 pg (ref 27.0–33.0)
MCHC: 32.9 g/dL (ref 32.0–36.0)
MCV: 93.2 fL (ref 80.0–100.0)
MONOS PCT: 12 %
MPV: 10.4 fL (ref 7.5–12.5)
Monocytes Absolute: 828 cells/uL (ref 200–950)
Neutro Abs: 2415 cells/uL (ref 1500–7800)
Neutrophils Relative %: 35 %
PLATELETS: 228 10*3/uL (ref 140–400)
RBC: 4.56 MIL/uL (ref 3.80–5.10)
RDW: 13.7 % (ref 11.0–15.0)
WBC: 6.9 10*3/uL (ref 3.8–10.8)

## 2015-12-04 LAB — BASIC METABOLIC PANEL
BUN: 22 mg/dL (ref 7–25)
CO2: 28 mmol/L (ref 20–31)
CREATININE: 0.91 mg/dL (ref 0.60–0.93)
Calcium: 9.4 mg/dL (ref 8.6–10.4)
Chloride: 101 mmol/L (ref 98–110)
Glucose, Bld: 92 mg/dL (ref 65–99)
POTASSIUM: 4.1 mmol/L (ref 3.5–5.3)
Sodium: 139 mmol/L (ref 135–146)

## 2015-12-04 LAB — LIPID PANEL
CHOL/HDL RATIO: 3.2 ratio (ref <=5.0)
CHOLESTEROL: 203 mg/dL — AB (ref 125–200)
HDL: 63 mg/dL (ref >=46)
LDL CALC: 114 mg/dL (ref <130)
TRIGLYCERIDES: 131 mg/dL (ref <150)
VLDL: 26 mg/dL (ref <30)

## 2015-12-04 NOTE — Patient Instructions (Signed)
Medication Instructions:  Your physician recommends that you continue on your current medications as directed. Please refer to the Current Medication list given to you today.  Labwork: Today: Lipid profile, CBC w/ diff and BMET  Testing/Procedures: Your physician has recommended that you have a lower extremity doppler.  Follow-Up: Your physician wants you to follow-up in: 6 months with Dr. Angelena Form. You will receive a reminder letter in the mail two months in advance. If you don't receive a letter, please call our office to schedule the follow-up appointment.  If you need a refill on your cardiac medications before your next appointment, please call your pharmacy.  Thank you for choosing [facility]!!

## 2015-12-06 NOTE — Progress Notes (Signed)
Patient ID: Emily Andrews, female   DOB: May 27, 1934, 81 y.o.   MRN: [id number] PCP: Dr. Olen Pel  81 yo with history of paroxysmal atrial fibrillation presents for cardiology followup.  She was admitted in 3/13 with very symptomatic atrial fibrillation/RVR.  She went back into NSR on diltiazem gtt overnight.  Initially, I started her on dronedarone and Xarelto.  These medications were both too expensive so she was transitioned to flecainide and coumadin, which she is on currently. In 10/15, she had a run of tachypalpitations associated with shoulder pain.  She went to the ER and was noted to be in atrial fibrillation with RVR.  She converted back to NSR after taking her flecainide.  Flecainide was then increased to 75 mg bid.    Today, she is doing well.  Rare palpitations, no long runs of tachypalpitations and no use of extra flecainide.  No chest pain.  No exertional dyspnea.  She is in NSR today.  No BRBPR or melena.  She has knee pain and also reports significant leg weakness that is the primary limitor to her ambulation.   ECG: sinus brady at 55, inferior and anterolateral T wave inversions (somewhat progressive compared to prior), poor RWP.   Labs (5/12): K 4.2, creatinine 0.5, LDL 125, HDL 46 Labs (7/12): K 4.2, creatinine 0.5, AST 63, ALT 64, HCT 43.3 Labs (3/13): LDL 85, HDL 63, cardiac enzymes negative, K 3.8, creatinine 0.64 Labs (11/13): K 4.1, creatinine 0.7 Labs (5/14): K 3.8, creatinine 0.7, HCT 42.9 Labs (11/14): K 4.1, creatinine 0.8, HCT 42 Labs (10/15): K 3.8, creatinine 0.67, HCT 41.8 Labs (5/16): K 3.6, creatinine 0.8, HCT 41.5  PMH: 1. Paroxysmal atrial fibrillation:  Echo (3/13) with EF 55-60%, mild MR. 2. Hyperlipidemia: Myalgias and leg weakness with pravastatin (very severe symptoms).  3. HTN 4. Osteoarthritis R > L knee 5. Lexiscan myoview (4/13): EF 78%, no ischemia or infarction.   SH: Lives in [place] with husband.  Nonsmoker.    FH: No premature CAD.    ROS: All systems reviewed and negative except as per HPI.   Current Outpatient Prescriptions  Medication Sig Dispense Refill  . Calcium Carbonate-Vitamin D (CALTRATE 600+D PO) Take 2 tablets by mouth daily.     . chlorthalidone (HYGROTON) 25 MG tablet Take 1 tablet (25 mg total) by mouth daily. 90 tablet 3  . cyanocobalamin 100 MCG tablet Take 100 mcg by mouth daily.      . flecainide (TAMBOCOR) 50 MG tablet TAKE ONE AND ONE-HALF TABLETS BY MOUTH TWICE DAILY 90 tablet 3  . KLOR-CON M20 20 MEQ tablet TAKE ONE TABLET BY MOUTH ONCE DAILY 90 tablet 2  . losartan (COZAAR) 100 MG tablet Take 100 mg by mouth daily.    . Magnesium 250 MG TABS Take by mouth 2 (two) times daily.  0  . metoprolol tartrate (LOPRESSOR) 25 MG tablet Take one tablet twice a day--you can take one-half 50mg  tablet twice a day    . Multiple Vitamins-Minerals (CENTRUM PO) Take 1 tablet by mouth daily.      . niacin 500 MG tablet Take 500 mg by mouth daily after breakfast.    . Omega-3 Fatty Acids (FISH OIL PO) Take 1 tablet by mouth daily.    Marland Kitchen omeprazole (PRILOSEC) 20 MG capsule Take 1 capsule (20 mg total) by mouth daily.    Marland Kitchen warfarin (COUMADIN) 5 MG tablet TAKE AS DIRECTED BY ANTICOAGULATION CLINIC 35 tablet 3   No current facility-administered medications for this  visit.     BP 132/72   Pulse (!) 55   Ht 5\' 4"  (1.626 m)   Wt 175 lb (79.4 kg)   BMI 30.04 kg/m  General: NAD Neck: No JVD, no thyromegaly or thyroid nodule.  Lungs: Clear to auscultation bilaterally with normal respiratory effort. CV: Nondisplaced PMI.  Heart regular S1/S2, no S3/S4, no murmur.  No peripheral edema.  No carotid bruit.  Difficult to palpate pedal pulses. Varicosities in lower legs bilaterally.  Abdomen: Soft, nontender, no hepatosplenomegaly, no distention.  Neurologic: Alert and oriented x 3.  Psych: Normal affect. Extremities: No clubbing or cyanosis.   Assessment/Plan: 1. Atrial fibrillation: No recent symptomatic atrial  fibrillation.    - Continue flecainide 75 mg bid.   - Continue metoprolol and warfarin.  - If she has breakthrough atrial fibrillation, she can take flecainide 150 mg x 1 and metoprolol 50 mg x 1 (do not take further flecainide that day).  - CBC and BMET today.  2. HTN: BP controlled.  3. Hyperlipidemia: Check lipids. Would consider transition off niacin onto Zetia.  Statins caused leg weakness.  4. Leg weakness: Limiting to her activity.  Initially blamed on statin but did not help much to stop it. Pulses difficult to palpate, will check peripheral arterial dopplers today.   Followup in 6 months.  Given my transition to CHF clinic, she will see Dr Angelena Form.   Loralie Champagne 12/06/2015

## 2015-12-09 ENCOUNTER — Telehealth: Payer: Self-pay

## 2015-12-09 DIAGNOSIS — Z79899 Other long term (current) drug therapy: Secondary | ICD-10-CM

## 2015-12-09 MED ORDER — EZETIMIBE 10 MG PO TABS: 10.0000 mg | ORAL_TABLET | Freq: Every day | ORAL | 11 refills | Status: DC

## 2015-12-09 NOTE — Telephone Encounter (Signed)
Called patient with lab results. Per Dr. Aundra Dubin, would stop Niacin and start ezetimibe 10 mg daily with lipids in 2 months. Paitent will come in 02/08/16 for lab work. Patient stated she is not sure about taking zetia. Informed patient to call the office if she decides not to take the zetia due to not tolerating or cost. Patient verbalized understanding.

## 2015-12-09 NOTE — Telephone Encounter (Signed)
-----   Message from Larey Dresser, MD sent at 12/06/2015 10:12 PM EDT ----- Would stop Niacin and start ezetimibe 10 mg daily with lipids in 2 months.

## 2015-12-10 ENCOUNTER — Other Ambulatory Visit: Payer: Self-pay | Admitting: Cardiology

## 2015-12-10 DIAGNOSIS — I739 Peripheral vascular disease, unspecified: Secondary | ICD-10-CM

## 2015-12-14 ENCOUNTER — Encounter (HOSPITAL_COMMUNITY): Payer: Self-pay

## 2015-12-14 ENCOUNTER — Emergency Department (HOSPITAL_COMMUNITY): Payer: Medicare Other

## 2015-12-14 ENCOUNTER — Telehealth: Payer: Self-pay | Admitting: Cardiology

## 2015-12-14 ENCOUNTER — Emergency Department (HOSPITAL_COMMUNITY)
Admission: EM | Admit: 2015-12-14 | Discharge: 2015-12-14 | Disposition: A | Discharge status: Home / Self Care | Payer: Medicare Other | Attending: Emergency Medicine | Admitting: Emergency Medicine

## 2015-12-14 DIAGNOSIS — E119 Type 2 diabetes mellitus without complications: Secondary | ICD-10-CM | POA: Diagnosis not present

## 2015-12-14 DIAGNOSIS — Z7901 Long term (current) use of anticoagulants: Secondary | ICD-10-CM | POA: Insufficient documentation

## 2015-12-14 DIAGNOSIS — I10 Essential (primary) hypertension: Secondary | ICD-10-CM | POA: Diagnosis not present

## 2015-12-14 DIAGNOSIS — R42 Dizziness and giddiness: Secondary | ICD-10-CM | POA: Diagnosis present

## 2015-12-14 DIAGNOSIS — R55 Syncope and collapse: Secondary | ICD-10-CM | POA: Diagnosis not present

## 2015-12-14 LAB — COMPREHENSIVE METABOLIC PANEL
ALK PHOS: 54 U/L (ref 38–126)
ALT: 19 U/L (ref 14–54)
AST: 29 U/L (ref 15–41)
Albumin: 4 g/dL (ref 3.5–5.0)
Anion gap: 10 (ref 5–15)
BUN: 15 mg/dL (ref 6–20)
CALCIUM: 9.7 mg/dL (ref 8.9–10.3)
CO2: 27 mmol/L (ref 22–32)
CREATININE: 0.84 mg/dL (ref 0.44–1.00)
Chloride: 106 mmol/L (ref 101–111)
Glucose, Bld: 112 mg/dL — ABNORMAL HIGH (ref 65–99)
Potassium: 4.3 mmol/L (ref 3.5–5.1)
SODIUM: 143 mmol/L (ref 135–145)
Total Bilirubin: 1.4 mg/dL — ABNORMAL HIGH (ref 0.3–1.2)
Total Protein: 6.8 g/dL (ref 6.5–8.1)

## 2015-12-14 LAB — CBC
HEMATOCRIT: 44.8 % (ref 36.0–46.0)
HEMOGLOBIN: 14.6 g/dL (ref 12.0–15.0)
MCH: 31.2 pg (ref 26.0–34.0)
MCHC: 32.6 g/dL (ref 30.0–36.0)
MCV: 95.7 fL (ref 78.0–100.0)
Platelets: 206 10*3/uL (ref 150–400)
RBC: 4.68 MIL/uL (ref 3.87–5.11)
RDW: 14.2 % (ref 11.5–15.5)
WBC: 7.8 10*3/uL (ref 4.0–10.5)

## 2015-12-14 LAB — DIFFERENTIAL
Basophils Absolute: 0 10*3/uL (ref 0.0–0.1)
Basophils Relative: 1 %
Eosinophils Absolute: 0.1 10*3/uL (ref 0.0–0.7)
Eosinophils Relative: 2 %
LYMPHS PCT: 43 %
Lymphs Abs: 3.3 10*3/uL (ref 0.7–4.0)
MONO ABS: 0.7 10*3/uL (ref 0.1–1.0)
MONOS PCT: 9 %
NEUTROS ABS: 3.6 10*3/uL (ref 1.7–7.7)
Neutrophils Relative %: 45 %

## 2015-12-14 LAB — I-STAT TROPONIN, ED: Troponin i, poc: 0 ng/mL (ref 0.00–0.08)

## 2015-12-14 LAB — I-STAT CHEM 8, ED
BUN: 18 mg/dL (ref 6–20)
CALCIUM ION: 1.11 mmol/L — AB (ref 1.15–1.40)
CHLORIDE: 104 mmol/L (ref 101–111)
Creatinine, Ser: 0.8 mg/dL (ref 0.44–1.00)
Glucose, Bld: 109 mg/dL — ABNORMAL HIGH (ref 65–99)
HCT: 46 % (ref 36.0–46.0)
Hemoglobin: 15.6 g/dL — ABNORMAL HIGH (ref 12.0–15.0)
POTASSIUM: 4.2 mmol/L (ref 3.5–5.1)
SODIUM: 143 mmol/L (ref 135–145)
TCO2: 26 mmol/L (ref 0–100)

## 2015-12-14 LAB — CBG MONITORING, ED
Glucose-Capillary: 104 mg/dL — ABNORMAL HIGH (ref 65–99)
Glucose-Capillary: 96 mg/dL (ref 65–99)

## 2015-12-14 LAB — PROTIME-INR
INR: 2.51
Prothrombin Time: 27.5 seconds — ABNORMAL HIGH (ref 11.4–15.2)

## 2015-12-14 LAB — APTT: aPTT: 41 seconds — ABNORMAL HIGH (ref 24–36)

## 2015-12-14 MED ORDER — CIPROFLOXACIN IN D5W 400 MG/200ML IV SOLN: 400.0000 mg | Freq: Two times a day (BID) | INTRAVENOUS | Status: DC

## 2015-12-14 MED ORDER — FLUTICASONE PROPIONATE 50 MCG/ACT NA SUSP
1.0000 | Freq: Every day | NASAL | 0 refills | Status: DC
Start: 2015-12-14 — End: 2016-06-09

## 2015-12-14 MED ORDER — MECLIZINE HCL 25 MG PO TABS: 25.0000 mg | ORAL_TABLET | Freq: Three times a day (TID) | ORAL | 0 refills | Status: DC | PRN

## 2015-12-14 MED ORDER — MECLIZINE HCL 25 MG PO TABS
25.0000 mg | ORAL_TABLET | Freq: Once | ORAL | Status: AC
  Administered 2015-12-14: 25 mg via ORAL
  Filled 2015-12-14: qty 1

## 2015-12-14 NOTE — ED Notes (Signed)
Pt. leaving with family. Papers reviewed with her and family and denies questions and verbalize intent to follow up

## 2015-12-14 NOTE — ED Notes (Signed)
Pt. Unable to sign a time of discharge because computer said that chart being used elsewhere and unable to pull up the signature pad

## 2015-12-14 NOTE — ED Provider Notes (Signed)
Assumed care from Dr. Lacinda Axon. Briefly, the patient is a 81 yo F who p/w acute onset positional vertigo. Suspect BPPV/peripheral vertigo. Sx improving with meclizine. Neuro consulted as pt was Code Stroke. Plan to f/u MRI, d/c if negative.  MRI negative for acute abnormality. Pt ambulatory in ED without difficulty, with improvement in sx with meclizine. Discussed diagnosis, disposition, and management with pt and family in detail. Will d/c with Meclizine, outpt f/u, and return precautions. Pt in agreement with this plan.   Duffy Bruce, MD 12/15/15 [phone]

## 2015-12-14 NOTE — ED Triage Notes (Signed)
Watching TV and all of a sudden she started feeling dizzy and was unable to get up by herself. Normally ambulatory with a cane with chronic R leg weakness from statins

## 2015-12-14 NOTE — Discharge Instructions (Signed)
MRI scan showed no acute findings. Medication for dizziness.  Rest. Follow-up your doctor.

## 2015-12-14 NOTE — Consult Note (Signed)
Requesting Physician: ED MD    Chief Complaint: Vertigo with imbalance to the left  History obtained from:  Patient    HPI:                                                                                                                                         Emily Andrews is an 81 y.o. female with known paroxysmal A. fib and currently on Coumadin. Patient was feeling baseline up until 10:30 this morning when she had a sudden onset of vertiginous sensation. Patient states that every time she moved, especially looking down, the room would spin from right to left. She states that if she stays still the sensation will either decrease or go away completely. She denies any numbness/ tingling or new weakness. Patient was brought to [facility] as a code stroke. On examination patient did show discomfort when moving from a laying position to a seated position.  Date last known well: Date: 12/14/2015 Time last known well: Time: 10:30 tPA Given: No: Currently on Coumadin and likely not large vessel.   Past Medical History:  Diagnosis Date  . Anxiety   . Arthritis    a. R>L knees  . Diabetes mellitus   . Environmental allergies   . Hiatal hernia   . Hyperlipidemia    a. statin intolerant - myalgias  . Paroxysmal atrial fibrillation ([place])    a. Dx in 2004.  Off coumadin x several yrs;  b. Normal Event Monitor 03/2011.    Past Surgical History:  Procedure Laterality Date  . ABDOMINAL HYSTERECTOMY    . CHOLECYSTECTOMY    . CYSTECTOMY     lower back  . lipoma tumor removed     right shoulder    Family History  Problem Relation Age of Onset  . Liver disease Mother     cirrhosis - died in 27's  . Heart failure Mother   . Stroke Father     died in 27's  . Coronary artery disease Sister     living  . Coronary artery disease Brother     living   Social History:  reports that she has never smoked. She has never used smokeless tobacco. She reports that she does not  drink alcohol or use drugs.  Allergies:  Allergies  Allergen Reactions  . Statins Other (See Comments)    Atrophy of leg muscle according to patient.  . Celebrex [Celecoxib]   . Clindamycin/Lincomycin   . Penicillins   . Phenergan [Promethazine Hcl]   . Robaxin [Methocarbamol]   . Adhesive [Tape] Itching and Rash    Pt allergic to monitor electrodes    Medications:  No current facility-administered medications for this encounter.    Current Outpatient Prescriptions  Medication Sig Dispense Refill  . Calcium Carbonate-Vitamin D (CALTRATE 600+D PO) Take 2 tablets by mouth daily.     . chlorthalidone (HYGROTON) 25 MG tablet Take 1 tablet (25 mg total) by mouth daily. 90 tablet 3  . cyanocobalamin 100 MCG tablet Take 100 mcg by mouth daily.      Marland Kitchen ezetimibe (ZETIA) 10 MG tablet Take 1 tablet (10 mg total) by mouth daily. 30 tablet 11  . flecainide (TAMBOCOR) 50 MG tablet TAKE ONE AND ONE-HALF TABLETS BY MOUTH TWICE DAILY 90 tablet 3  . KLOR-CON M20 20 MEQ tablet TAKE ONE TABLET BY MOUTH ONCE DAILY 90 tablet 2  . losartan (COZAAR) 100 MG tablet Take 100 mg by mouth daily.    . Magnesium 250 MG TABS Take by mouth 2 (two) times daily.  0  . metoprolol tartrate (LOPRESSOR) 25 MG tablet Take one tablet twice a day--you can take one-half 50mg  tablet twice a day    . Multiple Vitamins-Minerals (CENTRUM PO) Take 1 tablet by mouth daily.      . Omega-3 Fatty Acids (FISH OIL PO) Take 1 tablet by mouth daily.    Marland Kitchen omeprazole (PRILOSEC) 20 MG capsule Take 1 capsule (20 mg total) by mouth daily.    Marland Kitchen warfarin (COUMADIN) 5 MG tablet TAKE AS DIRECTED BY ANTICOAGULATION CLINIC 35 tablet 3     ROS:                                                                                                                                       History obtained from the patient  General ROS:  negative for - chills, fatigue, fever, night sweats, weight gain or weight loss Psychological ROS: negative for - behavioral disorder, hallucinations, memory difficulties, mood swings or suicidal ideation Ophthalmic ROS: negative for - blurry vision, double vision, eye pain or loss of vision ENT ROS: negative for - epistaxis, nasal discharge, oral lesions, sore throat, tinnitus or vertigo Allergy and Immunology ROS: negative for - hives or itchy/watery eyes Hematological and Lymphatic ROS: negative for - bleeding problems, bruising or swollen lymph nodes Endocrine ROS: negative for - galactorrhea, hair pattern changes, polydipsia/polyuria or temperature intolerance Respiratory ROS: negative for - cough, hemoptysis, shortness of breath or wheezing Cardiovascular ROS: negative for - chest pain, dyspnea on exertion, edema or irregular heartbeat Gastrointestinal ROS: negative for - abdominal pain, diarrhea, hematemesis, nausea/vomiting or stool incontinence Genito-Urinary ROS: negative for - dysuria, hematuria, incontinence or urinary frequency/urgency Musculoskeletal ROS: negative for - joint swelling or muscular weakness Neurological ROS: as noted in HPI Dermatological ROS: negative for rash and skin lesion changes  Neurologic Examination:  Height 5\' 4"  (1.626 m), weight 82.1 kg (181 lb).  HEENT-  Normocephalic, no lesions, without obvious abnormality.  Normal external eye and conjunctiva.  Normal TM's bilaterally.  Normal auditory canals and external ears. Normal external nose, mucus membranes and septum.  Normal pharynx. Cardiovascular- irregularly irregular rhythm, pulses palpable throughout   Lungs- chest clear, no wheezing, rales, normal symmetric air entry Abdomen- normal findings: bowel sounds normal Extremities- no edema Lymph-no adenopathy palpable Musculoskeletal-no joint tenderness,  deformity or swelling Skin-warm and dry, no hyperpigmentation, vitiligo, or suspicious lesions  Neurological Examination Mental Status: Alert, oriented, thought content appropriate.  Speech fluent without evidence of aphasia.  Able to follow 3 step commands without difficulty. Cranial Nerves: II:  Visual fields grossly normal, pupils equal, round, reactive to light and accommodation III,IV, VI: ptosis not present, extra-ocular motions intact bilaterally V,VII: smile symmetric, facial light touch sensation normal bilaterally VIII: hearing normal bilaterally IX,X: uvula rises symmetrically XI: bilateral shoulder shrug XII: midline tongue extension Motor: Right : Upper extremity   5/5    Left:     Upper extremity   5/5  Lower extremity   3/5     Lower extremity   5/5 Tone and bulk:normal tone throughout; no atrophy noted Sensory: Pinprick and light touch intact throughout, bilaterally Deep Tendon Reflexes: 1+ and symmetric throughout Plantars: Right: downgoing   Left: downgoing Cerebellar: normal finger-to-nose but it appeared to have slightly more trouble with the left,  and normal heel-to-shin test with left heel over right shin Gait: Not able to test secondary to significant discomfort and vertiginous sensation       Lab Results: Basic Metabolic Panel:  Recent Labs Lab 12/14/15 1303  NA 143  K 4.2  CL 104  GLUCOSE 109*  BUN 18  CREATININE 0.80    Liver Function Tests: No results for input(s): AST, ALT, ALKPHOS, BILITOT, PROT, ALBUMIN in the last 168 hours. No results for input(s): LIPASE, AMYLASE in the last 168 hours. No results for input(s): AMMONIA in the last 168 hours.  CBC:  Recent Labs Lab 12/14/15 1258 12/14/15 1303  WBC 7.8  --   NEUTROABS 3.6  --   HGB 14.6 15.6*  HCT 44.8 46.0  MCV 95.7  --   PLT 206  --     Cardiac Enzymes: No results for input(s): CKTOTAL, CKMB, CKMBINDEX, TROPONINI in the last 168 hours.  Lipid Panel: No results for  input(s): CHOL, TRIG, HDL, CHOLHDL, VLDL, LDLCALC in the last 168 hours.  CBG:  Recent Labs Lab 12/14/15 6734  LPFXTK 24    Microbiology: No results found for this or any previous visit.  Coagulation Studies: No results for input(s): LABPROT, INR in the last 72 hours.  Imaging: Ct Head Code Stroke W/o Cm  Result Date: 12/14/2015 CLINICAL DATA:  Code stroke. Acute presentation with dizziness and gait disturbance. EXAM: CT HEAD WITHOUT CONTRAST TECHNIQUE: Contiguous axial images were obtained from the base of the skull through the vertex without intravenous contrast. COMPARISON:  None. FINDINGS: The brain does not show accelerated atrophy. There is no evidence of old or acute focal infarction, mass lesion, hemorrhage, hydrocephalus or extra-axial collection. The calvarium is unremarkable. Mild chronic inflammatory changes of the maxillary sinuses. No fluid in the middle ears or mastoids. There is atherosclerotic calcification of the major vessels at the base of the brain. ASPECTS [facility] Stroke Program Early CT Score) - Ganglionic level infarction (caudate, lentiform nuclei, internal capsule, insula, M1-M3 cortex): 7 - Supraganglionic infarction (M4-M6 cortex):  3 Total score (0-10 with 10 being normal): 10 IMPRESSION: 1. No acute finding by CT.  Normal for age. 2. ASPECTS is 10 These results were called by telephone at the time of interpretation on 12/14/2015 at 1:20 pm to Dr. Theotis Burrow , who verbally acknowledged these results. Electronically Signed   By: Nelson Chimes M.D.   On: 12/14/2015 13:21       Assessment and plan discussed with with attending physician and they are in agreement.    Etta Quill PA-C [facility] [phone]  12/14/2015, 1:25 PM   Stroke Risk Factors - atrial fibrillation, diabetes mellitus and hyperlipidemia   Assessment: 81 y.o. female with new onset vertigo. Even with symptomatically movement, she does not have pronounced nystagmus,  Dix-Hallpike not attempt due to patient discomfort. At this time, there is nothing on physical exam or history that reliably can exclude central vertigo or peripheral vertigo and therefore I think an MRI would be most health.  1) MRI brain, stroke workup positive 2) if negative could treat with meclizine, if stable then could discharge home otherwise may need to be admitted for physical therapy  Roland Rack, MD [facility] [phone]  If 7pm- 7am, please page neurology on call as listed in [place].

## 2015-12-14 NOTE — ED Provider Notes (Addendum)
[facility] DEPT Provider Note   CSN: [id number] Arrival date & time: 12/14/15  1253   An emergency department physician performed an initial assessment on this suspected stroke patient at 1256.  History   Chief Complaint Chief Complaint  Patient presents with  . Code Stroke    HPI Emily Andrews is a 81 y.o. female.  Sense of dizziness while watching TV earlier today. She was unsteady when she attempted to arise. This is not normal for her. Patient has chronic right leg weakness secondary to statins. Code stroke was initiated when patient presented to the emergency department. Family reports normal behavior. Normal arm and leg movement. No slurred speech.      Past Medical History:  Diagnosis Date  . Anxiety   . Arthritis    a. R>L knees  . Diabetes mellitus   . Environmental allergies   . Hiatal hernia   . Hyperlipidemia    a. statin intolerant - myalgias  . Paroxysmal atrial fibrillation ([place])    a. Dx in 2004.  Off coumadin x several yrs;  b. Normal Event Monitor 03/2011.    Patient Active Problem List   Diagnosis Date Noted  . Encounter for therapeutic drug monitoring 05/20/2013  . Long term (current) use of anticoagulants 07/27/2011  . Atrial fibrillation with rapid ventricular response ([place]) 06/27/2011  . Elevated LFTs 02/16/2011  . Hypertension 08/18/2010  . Atrial fibrillation ([place]) 08/18/2010  . Hypercholesterolemia 08/18/2010  . Degenerative arthritis of cervical spine 08/18/2010    Past Surgical History:  Procedure Laterality Date  . ABDOMINAL HYSTERECTOMY    . CHOLECYSTECTOMY    . CYSTECTOMY     lower back  . lipoma tumor removed     right shoulder    OB History    No data available       Home Medications    Prior to Admission medications   Medication Sig Start Date End Date Taking? Authorizing Provider  Calcium Carbonate-Vitamin D (CALTRATE 600+D PO) Take 2 tablets by mouth daily.     Historical Provider, MD    chlorthalidone (HYGROTON) 25 MG tablet Take 1 tablet (25 mg total) by mouth daily. 08/07/14   Larey Dresser, MD  cyanocobalamin 100 MCG tablet Take 100 mcg by mouth daily.      Historical Provider, MD  ezetimibe (ZETIA) 10 MG tablet Take 1 tablet (10 mg total) by mouth daily. 12/09/15 01/08/16  Larey Dresser, MD  flecainide (TAMBOCOR) 50 MG tablet TAKE ONE AND ONE-HALF TABLETS BY MOUTH TWICE DAILY 09/15/15   Larey Dresser, MD  KLOR-CON M20 20 MEQ tablet TAKE ONE TABLET BY MOUTH ONCE DAILY 08/13/15   Larey Dresser, MD  losartan (COZAAR) 100 MG tablet Take 100 mg by mouth daily.    Historical Provider, MD  Magnesium 250 MG TABS Take by mouth 2 (two) times daily. 08/12/11   Larey Dresser, MD  meclizine (ANTIVERT) 25 MG tablet Take 1 tablet (25 mg total) by mouth 3 (three) times daily as needed for dizziness. 12/14/15   Nat Christen, MD  metoprolol tartrate (LOPRESSOR) 25 MG tablet Take one tablet twice a day--you can take one-half 50mg  tablet twice a day 08/12/11   Larey Dresser, MD  Multiple Vitamins-Minerals (CENTRUM PO) Take 1 tablet by mouth daily.      Historical Provider, MD  Omega-3 Fatty Acids (FISH OIL PO) Take 1 tablet by mouth daily.    Historical Provider, MD  omeprazole (PRILOSEC) 20 MG capsule Take 1  capsule (20 mg total) by mouth daily. 08/12/11   Larey Dresser, MD  warfarin (COUMADIN) 5 MG tablet TAKE AS DIRECTED BY ANTICOAGULATION CLINIC 10/02/15   Larey Dresser, MD    Family History Family History  Problem Relation Age of Onset  . Liver disease Mother     cirrhosis - died in 16's  . Heart failure Mother   . Stroke Father     died in 36's  . Coronary artery disease Sister     living  . Coronary artery disease Brother     living    Social History Social History  Substance Use Topics  . Smoking status: Never Smoker  . Smokeless tobacco: Never Used  . Alcohol use No     Allergies   Statins; Celebrex [celecoxib]; Clindamycin/lincomycin; Penicillins; Phenergan  [promethazine hcl]; Robaxin [methocarbamol]; and Adhesive [tape]   Review of Systems Review of Systems  All other systems reviewed and are negative.    Physical Exam Updated Vital Signs BP 153/69   Pulse 61   Resp 18   Ht 5\' 4"  (1.626 m)   Wt 181 lb (82.1 kg)   SpO2 99%   BMI 31.07 kg/m   Physical Exam  Constitutional: She is oriented to person, place, and time. She appears well-developed and well-nourished.  HENT:  Head: Normocephalic and atraumatic.  Eyes: Conjunctivae are normal.  Neck: Neck supple.  Cardiovascular: Normal rate and regular rhythm.   Pulmonary/Chest: Effort normal and breath sounds normal.  Abdominal: Soft. Bowel sounds are normal.  Musculoskeletal: Normal range of motion.  Neurological: She is alert and oriented to person, place, and time.  Skin: Skin is warm and dry.  Psychiatric: She has a normal mood and affect. Her behavior is normal.  Nursing note and vitals reviewed.    ED Treatments / Results  Labs (all labs ordered are listed, but only abnormal results are displayed) Labs Reviewed  PROTIME-INR - Abnormal; Notable for the following:       Result Value   Prothrombin Time 27.5 (*)    All other components within normal limits  APTT - Abnormal; Notable for the following:    aPTT 41 (*)    All other components within normal limits  COMPREHENSIVE METABOLIC PANEL - Abnormal; Notable for the following:    Glucose, Bld 112 (*)    Total Bilirubin 1.4 (*)    All other components within normal limits  I-STAT CHEM 8, ED - Abnormal; Notable for the following:    Glucose, Bld 109 (*)    Calcium, Ion 1.11 (*)    Hemoglobin 15.6 (*)    All other components within normal limits  CBG MONITORING, ED - Abnormal; Notable for the following:    Glucose-Capillary 104 (*)    All other components within normal limits  CBC  DIFFERENTIAL  I-STAT TROPOININ, ED  CBG MONITORING, ED    EKG  EKG Interpretation None       Radiology Ct Head Code Stroke  W/o Cm  Result Date: 12/14/2015 CLINICAL DATA:  Code stroke. Acute presentation with dizziness and gait disturbance. EXAM: CT HEAD WITHOUT CONTRAST TECHNIQUE: Contiguous axial images were obtained from the base of the skull through the vertex without intravenous contrast. COMPARISON:  None. FINDINGS: The brain does not show accelerated atrophy. There is no evidence of old or acute focal infarction, mass lesion, hemorrhage, hydrocephalus or extra-axial collection. The calvarium is unremarkable. Mild chronic inflammatory changes of the maxillary sinuses. No fluid in the middle ears  or mastoids. There is atherosclerotic calcification of the major vessels at the base of the brain. ASPECTS [facility] Stroke Program Early CT Score) - Ganglionic level infarction (caudate, lentiform nuclei, internal capsule, insula, M1-M3 cortex): 7 - Supraganglionic infarction (M4-M6 cortex): 3 Total score (0-10 with 10 being normal): 10 IMPRESSION: 1. No acute finding by CT.  Normal for age. 2. ASPECTS is 10 These results were called by telephone at the time of interpretation on 12/14/2015 at 1:20 pm to Dr. Theotis Burrow , who verbally acknowledged these results. Electronically Signed   By: Nelson Chimes M.D.   On: 12/14/2015 13:21    Procedures Procedures (including critical care time)  Medications Ordered in ED Medications  meclizine (ANTIVERT) tablet 25 mg (25 mg Oral Given 12/14/15 1515)     Initial Impression / Assessment and Plan / ED Course  I have reviewed the triage vital signs and the nursing notes.  Pertinent labs & imaging results that were available during my care of the patient were reviewed by me and considered in my medical decision making (see chart for details).  Clinical Course    Neurologist examined patient. Most likely diagnosis is vertigo. If MRI brain is negative, patient can be discharged home. Discharge medications meclizine 25 mg.  Disc c Dr Ellender Hose  Final Clinical Impressions(s) / ED Diagnoses    Final diagnoses:  Vertigo    New Prescriptions New Prescriptions   MECLIZINE (ANTIVERT) 25 MG TABLET    Take 1 tablet (25 mg total) by mouth 3 (three) times daily as needed for dizziness.     Nat Christen, MD 12/14/15 1551    Nat Christen, MD 12/14/15 [phone]

## 2015-12-14 NOTE — Telephone Encounter (Signed)
INR is in goal range 2-3, pt has been stable on current dose. Called her to advise her to continue current Coumadin regimen and will recheck INR with [place] as planned on 9/22.
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
## 2015-12-22 ENCOUNTER — Encounter (HOSPITAL_COMMUNITY): Payer: Medicare Other

## 2015-12-22 ENCOUNTER — Ambulatory Visit (HOSPITAL_COMMUNITY)
Admission: RE | Admit: 2015-12-22 | Discharge: 2015-12-22 | Disposition: A | Discharge status: Home / Self Care | Payer: Medicare Other | Source: Ambulatory Visit | Attending: Cardiology | Admitting: Cardiology

## 2015-12-22 DIAGNOSIS — I739 Peripheral vascular disease, unspecified: Secondary | ICD-10-CM | POA: Diagnosis not present

## 2015-12-28 ENCOUNTER — Ambulatory Visit (INDEPENDENT_AMBULATORY_CARE_PROVIDER_SITE_OTHER): Payer: Medicare Other | Admitting: *Deleted

## 2015-12-28 DIAGNOSIS — I4891 Unspecified atrial fibrillation: Secondary | ICD-10-CM | POA: Diagnosis not present

## 2015-12-28 DIAGNOSIS — Z5181 Encounter for therapeutic drug level monitoring: Secondary | ICD-10-CM

## 2015-12-28 DIAGNOSIS — Z7901 Long term (current) use of anticoagulants: Secondary | ICD-10-CM | POA: Diagnosis not present

## 2015-12-28 LAB — POCT INR: INR: 2.7

## 2015-12-30 ENCOUNTER — Telehealth: Payer: Self-pay | Admitting: *Deleted

## 2015-12-30 NOTE — Telephone Encounter (Signed)
Pt called to inform [facility] that she missed last night's dose of Coumadin & wanted to see if she could take it now.  Instructed pt that she could take her Coumadin within 12 hrs of missing a dose & she verbalized understanding.  She will take the 1/2 tablet that she missed now since it is within the 12 hour time and she will resume normal schedule tonight.  Advised her to call [facility] with any other isssue & she verbalized understanding.

## 2016-01-13 ENCOUNTER — Other Ambulatory Visit: Payer: Self-pay | Admitting: Cardiology

## 2016-01-25 ENCOUNTER — Ambulatory Visit (INDEPENDENT_AMBULATORY_CARE_PROVIDER_SITE_OTHER): Payer: Medicare Other | Admitting: *Deleted

## 2016-01-25 DIAGNOSIS — Z5181 Encounter for therapeutic drug level monitoring: Secondary | ICD-10-CM

## 2016-01-25 DIAGNOSIS — Z7901 Long term (current) use of anticoagulants: Secondary | ICD-10-CM | POA: Diagnosis not present

## 2016-01-25 DIAGNOSIS — I4891 Unspecified atrial fibrillation: Secondary | ICD-10-CM | POA: Diagnosis not present

## 2016-01-25 LAB — POCT INR: INR: 2.6

## 2016-02-08 ENCOUNTER — Telehealth: Payer: Self-pay | Admitting: Cardiology

## 2016-02-08 ENCOUNTER — Other Ambulatory Visit: Payer: Medicare Other | Admitting: *Deleted

## 2016-02-08 DIAGNOSIS — Z79899 Other long term (current) drug therapy: Secondary | ICD-10-CM

## 2016-02-08 LAB — LIPID PANEL
CHOL/HDL RATIO: 2.9 ratio (ref <5.0)
Cholesterol: 170 mg/dL (ref <200)
HDL: 59 mg/dL (ref >50)
LDL Cholesterol: 86 mg/dL
TRIGLYCERIDES: 124 mg/dL (ref <150)
VLDL: 25 mg/dL (ref <30)

## 2016-02-08 NOTE — Telephone Encounter (Signed)
Pt states she is changing insurance Jan 1,2018. Pt states she has been getting Zetia for $6 from [place], [place] has told pt it will be $45 and is Tier 3. Pt states new insurance company, [employer],  is requesting phone call about  prior authorization for Zetia. Pt states  [employer] phone number is [phone], her new policy # [id number].  Pt states she can also be contacted on her cell [phone].

## 2016-02-08 NOTE — Telephone Encounter (Signed)
Pt advised I will forward to [place] for help with prior authorization.

## 2016-02-08 NOTE — Telephone Encounter (Signed)
New Message  Pt voiced wanting nurse to contact her back.  Please f/u

## 2016-02-15 ENCOUNTER — Telehealth: Payer: Self-pay | Admitting: Cardiology

## 2016-02-15 NOTE — Telephone Encounter (Signed)
Tier exception for Zetia 10mg  faxed to [facility].

## 2016-02-15 NOTE — Telephone Encounter (Signed)
Tier exception submitted to [facility].

## 2016-02-15 NOTE — Telephone Encounter (Signed)
Pt returning call to [place]-pls call home number

## 2016-02-15 NOTE — Telephone Encounter (Signed)
Spoke with patient about recent lab results 

## 2016-03-07 ENCOUNTER — Ambulatory Visit (INDEPENDENT_AMBULATORY_CARE_PROVIDER_SITE_OTHER): Payer: Medicare Other

## 2016-03-07 DIAGNOSIS — Z7901 Long term (current) use of anticoagulants: Secondary | ICD-10-CM

## 2016-03-07 DIAGNOSIS — I4891 Unspecified atrial fibrillation: Secondary | ICD-10-CM

## 2016-03-07 DIAGNOSIS — Z5181 Encounter for therapeutic drug level monitoring: Secondary | ICD-10-CM | POA: Diagnosis not present

## 2016-03-07 LAB — POCT INR: INR: 2.2

## 2016-03-10 ENCOUNTER — Telehealth: Payer: Self-pay

## 2016-03-10 NOTE — Telephone Encounter (Signed)
Letter from [facility] now approving Zetia 10mg . Auth is good through 04/03/2017. [id number].

## 2016-03-14 ENCOUNTER — Encounter (HOSPITAL_COMMUNITY): Payer: Self-pay | Admitting: Emergency Medicine

## 2016-03-14 ENCOUNTER — Emergency Department (HOSPITAL_COMMUNITY)
Admission: EM | Admit: 2016-03-14 | Discharge: 2016-03-15 | Disposition: A | Discharge status: Home / Self Care | Payer: Medicare Other | Attending: Emergency Medicine | Admitting: Emergency Medicine

## 2016-03-14 DIAGNOSIS — I10 Essential (primary) hypertension: Secondary | ICD-10-CM | POA: Insufficient documentation

## 2016-03-14 DIAGNOSIS — Z7901 Long term (current) use of anticoagulants: Secondary | ICD-10-CM | POA: Insufficient documentation

## 2016-03-14 DIAGNOSIS — Y939 Activity, unspecified: Secondary | ICD-10-CM | POA: Insufficient documentation

## 2016-03-14 DIAGNOSIS — Z79899 Other long term (current) drug therapy: Secondary | ICD-10-CM | POA: Diagnosis not present

## 2016-03-14 DIAGNOSIS — M7041 Prepatellar bursitis, right knee: Secondary | ICD-10-CM | POA: Diagnosis not present

## 2016-03-14 DIAGNOSIS — M25561 Pain in right knee: Secondary | ICD-10-CM | POA: Diagnosis present

## 2016-03-14 DIAGNOSIS — E119 Type 2 diabetes mellitus without complications: Secondary | ICD-10-CM | POA: Diagnosis not present

## 2016-03-14 NOTE — ED Triage Notes (Signed)
Pt brought in by EMS for c/o right knee  Pt states she has had this kind of pain before  No injury to note  Pt has swelling and pain to the right knee  Pt states it is very painful to bend

## 2016-03-15 LAB — BASIC METABOLIC PANEL
Anion gap: 7 (ref 5–15)
BUN: 20 mg/dL (ref 6–20)
CALCIUM: 9.2 mg/dL (ref 8.9–10.3)
CHLORIDE: 104 mmol/L (ref 101–111)
CO2: 28 mmol/L (ref 22–32)
CREATININE: 0.66 mg/dL (ref 0.44–1.00)
Glucose, Bld: 124 mg/dL — ABNORMAL HIGH (ref 65–99)
Potassium: 3.8 mmol/L (ref 3.5–5.1)
SODIUM: 139 mmol/L (ref 135–145)

## 2016-03-15 LAB — PROTIME-INR
INR: 2.04
PROTHROMBIN TIME: 23.3 s — AB (ref 11.4–15.2)

## 2016-03-15 MED ORDER — OXYCODONE-ACETAMINOPHEN 5-325 MG PO TABS: 1.0000 | ORAL_TABLET | ORAL | 0 refills | Status: DC | PRN

## 2016-03-15 MED ORDER — OXYCODONE-ACETAMINOPHEN 5-325 MG PO TABS
1.0000 | ORAL_TABLET | Freq: Once | ORAL | Status: AC
  Administered 2016-03-15: 1 via ORAL
  Filled 2016-03-15: qty 1

## 2016-03-15 MED ORDER — POTASSIUM CHLORIDE CRYS ER 20 MEQ PO TBCR
40.0000 meq | EXTENDED_RELEASE_TABLET | Freq: Once | ORAL | Status: AC
  Administered 2016-03-15: 40 meq via ORAL
  Filled 2016-03-15: qty 2

## 2016-03-15 NOTE — ED Provider Notes (Signed)
[facility] DEPT Provider Note   CSN: [id number] Arrival date & time: 03/14/16  2250 By signing my name below, I, Dyke Brackett, attest that this documentation has been prepared under the direction and in the presence of Delora Fuel, MD . Electronically Signed: Dyke Brackett, Scribe. 03/15/2016. 12:37 AM.   History   Chief Complaint Chief Complaint  Patient presents with  . Knee Pain    HPI Emily Andrews is a 81 y.o. female with a hx of A-fib, arthritis, DM, and HTN who presents to the Emergency Department complaining of sudden onset, worsening right knee pain onset yesterday afternoon. Pt describes her pain as aching and throbbing and rates her pain 10/10 in severity. Pain is exacerbated by bending; no alleviating factors noted. She notes associated swelling to the area. Pt is followed by Loralie Champagne for cardiology and is currently on Warfarin for A-fib. Per pt, she has not taken her warfarin tonight. Pt denies any injury or trauma to the area. She denies any fever, chills or any other associated symptoms.   The history is provided by the patient. No language interpreter was used.   Past Medical History:  Diagnosis Date  . Anxiety   . Arthritis    a. R>L knees  . Diabetes mellitus   . Environmental allergies   . Hiatal hernia   . Hyperlipidemia    a. statin intolerant - myalgias  . Paroxysmal atrial fibrillation ([place])    a. Dx in 2004.  Off coumadin x several yrs;  b. Normal Event Monitor 03/2011.    Patient Active Problem List   Diagnosis Date Noted  . Encounter for therapeutic drug monitoring 05/20/2013  . Long term (current) use of anticoagulants 07/27/2011  . Atrial fibrillation with rapid ventricular response ([place]) 06/27/2011  . Elevated LFTs 02/16/2011  . Hypertension 08/18/2010  . Atrial fibrillation ([place]) 08/18/2010  . Hypercholesterolemia 08/18/2010  . Degenerative arthritis of cervical spine 08/18/2010    Past Surgical History:  Procedure  Laterality Date  . ABDOMINAL HYSTERECTOMY    . CHOLECYSTECTOMY    . CYSTECTOMY     lower back  . lipoma tumor removed     right shoulder    OB History    No data available     Home Medications    Prior to Admission medications   Medication Sig Start Date End Date Taking? Authorizing Provider  Calcium Carbonate-Vitamin D (CALTRATE 600+D PO) Take 1 tablet by mouth 2 (two) times daily.     Historical Provider, MD  chlorthalidone (HYGROTON) 25 MG tablet Take 1 tablet (25 mg total) by mouth daily. 08/07/14   Larey Dresser, MD  cyanocobalamin 100 MCG tablet Take 100 mcg by mouth daily.      Historical Provider, MD  ezetimibe (ZETIA) 10 MG tablet Take 1 tablet (10 mg total) by mouth daily. 12/09/15 01/08/16  Larey Dresser, MD  flecainide (TAMBOCOR) 50 MG tablet TAKE ONE AND ONE-HALF TABLETS BY MOUTH TWICE DAILY 01/13/16   Larey Dresser, MD  fluticasone [facility]) 50 MCG/ACT nasal spray Place 1 spray into both nostrils daily. 12/14/15   Duffy Bruce, MD  KLOR-CON M20 20 MEQ tablet TAKE ONE TABLET BY MOUTH ONCE DAILY 08/13/15   Larey Dresser, MD  losartan (COZAAR) 100 MG tablet Take 100 mg by mouth daily.    Historical Provider, MD  Magnesium 250 MG TABS Take by mouth 2 (two) times daily. 08/12/11   Larey Dresser, MD  meclizine (ANTIVERT) 25 MG tablet Take  1 tablet (25 mg total) by mouth 3 (three) times daily as needed for dizziness. 12/14/15   Nat Christen, MD  metoprolol tartrate (LOPRESSOR) 25 MG tablet Take one tablet twice a day--you can take one-half 50mg  tablet twice a day 08/12/11   Larey Dresser, MD  Multiple Vitamins-Minerals (CENTRUM PO) Take 1 tablet by mouth daily.      Historical Provider, MD  Omega-3 Fatty Acids (FISH OIL PO) Take 2 capsules by mouth daily.     Historical Provider, MD  omeprazole (PRILOSEC) 20 MG capsule Take 1 capsule (20 mg total) by mouth daily. 08/12/11   Larey Dresser, MD  warfarin (COUMADIN) 5 MG tablet TAKE AS DIRECTED BY ANTICOAGULATION CLINIC Patient  taking differently: Take 2.5-5 mg by mouth daily at 6 PM. Takes 2.5mg  on Tues and Sat Takes 5mg  all other days 10/02/15   Larey Dresser, MD    Family History Family History  Problem Relation Age of Onset  . Liver disease Mother     cirrhosis - died in 54's  . Heart failure Mother   . Stroke Father     died in 58's  . Coronary artery disease Sister     living  . Coronary artery disease Brother     living    Social History Social History  Substance Use Topics  . Smoking status: Never Smoker  . Smokeless tobacco: Never Used  . Alcohol use No     Allergies   Statins; Adhesive [tape]; Celebrex [celecoxib]; Clindamycin/lincomycin; Penicillins; Phenergan [promethazine hcl]; and Robaxin [methocarbamol]   Review of Systems Review of Systems  Constitutional: Negative for chills and fever.  Musculoskeletal: Positive for arthralgias and joint swelling.  Hematological: Bruises/bleeds easily.  All other systems reviewed and are negative.  Physical Exam Updated Vital Signs BP 145/63 (BP Location: Left Arm)   Pulse 81   Temp 98.1 F (36.7 C) (Oral)   Resp 20   Ht 5\' 4"  (1.626 m)   Wt 169 lb (76.7 kg)   SpO2 94%   BMI 29.01 kg/m   Physical Exam  Constitutional: She is oriented to person, place, and time. She appears well-developed and well-nourished.  HENT:  Head: Normocephalic and atraumatic.  Eyes: EOM are normal. Pupils are equal, round, and reactive to light.  Neck: Normal range of motion. Neck supple. No JVD present.  Cardiovascular: Normal rate, regular rhythm and normal heart sounds.   No murmur heard. Pulmonary/Chest: Effort normal and breath sounds normal. She has no wheezes. She has no rales. She exhibits no tenderness.  Abdominal: Soft. Bowel sounds are normal. She exhibits no distension and no mass. There is no tenderness.  Musculoskeletal: Normal range of motion. She exhibits edema.  Marked swelling anterior aspect of right knee with large effusion in  pre-patellar versa   Lymphadenopathy:    She has no cervical adenopathy.  Neurological: She is alert and oriented to person, place, and time. No cranial nerve deficit. She exhibits normal muscle tone. Coordination normal.  Skin: Skin is warm and dry. No rash noted.  Psychiatric: She has a normal mood and affect. Her behavior is normal. Judgment and thought content normal.  Nursing note and vitals reviewed.   ED Treatments / Results  DIAGNOSTIC STUDIES:  Oxygen Saturation is 94% on RA, low by my interpretation.    COORDINATION OF CARE:  12:35 AM Discussed treatment plan with pt at bedside and pt agreed to plan.   Labs (all labs ordered are listed, but only abnormal results  are displayed) Labs Reviewed  PROTIME-INR - Abnormal; Notable for the following:       Result Value   Prothrombin Time 23.3 (*)    All other components within normal limits  BASIC METABOLIC PANEL - Abnormal; Notable for the following:    Glucose, Bld 124 (*)    All other components within normal limits    Procedures Procedures (including critical care time)  Medications Ordered in ED Medications  oxyCODONE-acetaminophen (PERCOCET/ROXICET) 5-325 MG per tablet 1 tablet (1 tablet Oral Given 03/15/16 0054)  potassium chloride SA (K-DUR,KLOR-CON) CR tablet 40 mEq (40 mEq Oral Given 03/15/16 0205)  oxyCODONE-acetaminophen (PERCOCET/ROXICET) 5-325 MG per tablet 1 tablet (1 tablet Oral Given 03/15/16 0208)     Initial Impression / Assessment and Plan / ED Course  I have reviewed the triage vital signs and the nursing notes.  Pertinent lab results that were available during my care of the patient were reviewed by me and considered in my medical decision making (see chart for details).  Clinical Course    Pain and swelling of the right knee. Swelling is all in the anterior aspect and is consistent with hemorrhage into the prepatellar bursa. She is anticoagulated on warfarin. Review of old records shows that INR  as have been in therapeutic range with most recent one being drawn on December 7. She's given oxycodone have acetaminophen for pain. INR is checked and is therapeutic at 2.05. She is also complaining of some cramping in muscles of her right leg and is on a diuretic, so electrolytes are checked and potassium is noted to be 3.8. She's given a dose of K-Dur. She has a prescription for potassium at home and she is to continue taking it. She is referred back to her orthopedic physician.  Final Clinical Impressions(s) / ED Diagnoses   Final diagnoses:  Hemorrhagic prepatellar bursitis of right knee  Anticoagulated on warfarin   New Prescriptions New Prescriptions   OXYCODONE-ACETAMINOPHEN (PERCOCET) 5-325 MG TABLET    Take 1-2 tablets by mouth every 4 (four) hours as needed for moderate pain.   I personally performed the services described in this documentation, which was scribed in my presence. The recorded information has been reviewed and is accurate.       Delora Fuel, MD 88/28/00 3491

## 2016-03-15 NOTE — Discharge Instructions (Signed)
Apply ice to your knee several times a day. Keep it elevated. Use the immobilizer and crutches as needed. Follow up with Dr. Maureen Ralphs - call his office in the morning for an appointment as soon as possible.

## 2016-03-22 ENCOUNTER — Other Ambulatory Visit: Payer: Self-pay | Admitting: Cardiology

## 2016-03-31 ENCOUNTER — Other Ambulatory Visit: Payer: Self-pay | Admitting: Cardiology

## 2016-04-18 ENCOUNTER — Ambulatory Visit (INDEPENDENT_AMBULATORY_CARE_PROVIDER_SITE_OTHER): Payer: Medicare Other | Admitting: *Deleted

## 2016-04-18 DIAGNOSIS — I4891 Unspecified atrial fibrillation: Secondary | ICD-10-CM | POA: Diagnosis not present

## 2016-04-18 DIAGNOSIS — Z5181 Encounter for therapeutic drug level monitoring: Secondary | ICD-10-CM

## 2016-04-18 DIAGNOSIS — Z7901 Long term (current) use of anticoagulants: Secondary | ICD-10-CM

## 2016-04-18 LAB — POCT INR: INR: 2.9

## 2016-05-30 ENCOUNTER — Ambulatory Visit (INDEPENDENT_AMBULATORY_CARE_PROVIDER_SITE_OTHER): Payer: Medicare Other | Admitting: *Deleted

## 2016-05-30 DIAGNOSIS — I4891 Unspecified atrial fibrillation: Secondary | ICD-10-CM | POA: Diagnosis not present

## 2016-05-30 DIAGNOSIS — Z5181 Encounter for therapeutic drug level monitoring: Secondary | ICD-10-CM | POA: Diagnosis not present

## 2016-05-30 DIAGNOSIS — Z7901 Long term (current) use of anticoagulants: Secondary | ICD-10-CM | POA: Diagnosis not present

## 2016-05-30 LAB — POCT INR: INR: 2.2

## 2016-06-09 ENCOUNTER — Encounter: Payer: Self-pay | Admitting: Cardiovascular Disease

## 2016-06-09 ENCOUNTER — Ambulatory Visit (INDEPENDENT_AMBULATORY_CARE_PROVIDER_SITE_OTHER): Payer: Medicare Other | Admitting: Cardiovascular Disease

## 2016-06-09 VITALS — BP 114/60 | HR 60 | Ht 64.0 in | Wt 172.4 lb

## 2016-06-09 DIAGNOSIS — I480 Paroxysmal atrial fibrillation: Secondary | ICD-10-CM

## 2016-06-09 NOTE — Patient Instructions (Signed)

## 2016-06-09 NOTE — Progress Notes (Signed)
Chief Complaint  Patient presents with  . Essential Hypertension    History of Present Illness: 82 yo female with history of paroxysmal atrial fibrillation, diet controlled DM, HLD who is here today to establish in my office. She has been followed by Dr. Aundra Dubin but he is no longer seeing patients in this office. She was originally admitted March 2013 with symptomatic atrial fib with RVR. She converted to sinus on Diltiazem drip. She was started on Xarelto and dronedarone but she could not afford these medications. She was changed to flecainide, metoprolol and coumadin.   She tells me today that she feels well. She has no chest pain or dyspnea. She has bilateral knee pains. She has no ankle edema. No dizziness, near syncope or syncope.   Primary Care Physician: Orpah Melter, MD  Past Medical History:  Diagnosis Date  . Anxiety   . Arthritis    a. R>L knees  . Diabetes mellitus   . Environmental allergies   . Hiatal hernia   . Hyperlipidemia    a. statin intolerant - myalgias  . Paroxysmal atrial fibrillation ([place])    a. Dx in 2004.  Off coumadin x several yrs;  b. Normal Event Monitor 03/2011.    Past Surgical History:  Procedure Laterality Date  . ABDOMINAL HYSTERECTOMY    . CHOLECYSTECTOMY    . CYSTECTOMY     lower back  . lipoma tumor removed     right shoulder    Current Outpatient Prescriptions  Medication Sig Dispense Refill  . Calcium Carbonate-Vitamin D (CALTRATE 600+D PO) Take 1 tablet by mouth 2 (two) times daily.     . chlorthalidone (HYGROTON) 25 MG tablet Take 1 tablet (25 mg total) by mouth daily. 90 tablet 3  . cyanocobalamin 100 MCG tablet Take 100 mcg by mouth daily.      . flecainide (TAMBOCOR) 50 MG tablet TAKE ONE AND ONE-HALF TABLETS BY MOUTH TWICE DAILY 135 tablet 3  . fluticasone (FLONASE) 50 MCG/ACT nasal spray Place 1 spray into both nostrils daily as needed for allergies or rhinitis.    Marland Kitchen KLOR-CON M20 20 MEQ tablet TAKE ONE TABLET BY MOUTH ONCE  DAILY 90 tablet 1  . losartan (COZAAR) 100 MG tablet Take 100 mg by mouth daily.    . Magnesium 250 MG TABS Take 1 tablet by mouth 2 (two) times daily.   0  . meclizine (ANTIVERT) 25 MG tablet Take 1 tablet (25 mg total) by mouth 3 (three) times daily as needed for dizziness. 21 tablet 0  . metoprolol tartrate (LOPRESSOR) 25 MG tablet Take one tablet twice a day--you can take one-half 50mg  tablet twice a day    . Multiple Vitamins-Minerals (CENTRUM PO) Take 1 tablet by mouth daily.      . Omega-3 Fatty Acids (FISH OIL PO) Take 2 capsules by mouth daily.     Marland Kitchen omeprazole (PRILOSEC) 20 MG capsule Take 1 capsule (20 mg total) by mouth daily.    Marland Kitchen oxyCODONE-acetaminophen (PERCOCET) 5-325 MG tablet Take 1-2 tablets by mouth every 4 (four) hours as needed for moderate pain. 20 tablet 0  . warfarin (COUMADIN) 5 MG tablet TAKE BY MOUTH AS DIRECTED BY ANTICOAGULATION CLINIC 35 tablet 3  . ezetimibe (ZETIA) 10 MG tablet Take 1 tablet (10 mg total) by mouth daily. 30 tablet 11   No current facility-administered medications for this visit.     Allergies  Allergen Reactions  . Statins Other (See Comments)    Atrophy of  leg muscle according to patient.  . Adhesive [Tape] Itching and Rash    Pt allergic to monitor electrodes  . Celebrex [Celecoxib] Other (See Comments)    unknown  . Clindamycin/Lincomycin Rash and Other (See Comments)  . Penicillins Other (See Comments)    unknown  . Phenergan [Promethazine Hcl] Other (See Comments)    unknown  . Robaxin [Methocarbamol] Other (See Comments)    unknown    Social History   Social History  . Marital status: Married    Spouse name: N/A  . Number of children: N/A  . Years of education: N/A   Occupational History  . Not on file.   Social History Main Topics  . Smoking status: Never Smoker  . Smokeless tobacco: Never Used  . Alcohol use No  . Drug use: No  . Sexual activity: Not on file   Other Topics Concern  . Not on file   Social  History Narrative   Lives in [place] with husband.  Active @ home.    Family History  Problem Relation Age of Onset  . Liver disease Mother     cirrhosis - died in 3's  . Heart failure Mother   . Stroke Father     died in 52's  . Coronary artery disease Sister     living  . Coronary artery disease Brother     living    Review of Systems:  As stated in the HPI and otherwise negative.   BP 114/60   Pulse 60   Ht 5\' 4"  (1.626 m)   Wt 172 lb 6.4 oz (78.2 kg)   BMI 29.59 kg/m   Physical Examination: General: Well developed, well nourished, NAD  HEENT: OP clear, mucus membranes moist  SKIN: warm, dry. No rashes. Neuro: No focal deficits  Musculoskeletal: Muscle strength 5/5 all ext  Psychiatric: Mood and affect normal  Neck: No JVD, no carotid bruits, no thyromegaly, no lymphadenopathy.  Lungs:Clear bilaterally, no wheezes, rhonci, crackles Cardiovascular: Regular rate and rhythm. No murmurs, gallops or rubs. Abdomen:Soft. Bowel sounds present. Non-tender.  Extremities: No lower extremity edema. Pulses are 2 + in the bilateral DP/PT.  EKG:  EKG is not ordered today. The ekg ordered today demonstrates   Recent Labs: 12/14/2015: ALT 19; Hemoglobin 15.6; Platelets 206 03/15/2016: BUN 20; Creatinine, Ser 0.66; Potassium 3.8; Sodium 139   Lipid Panel    Component Value Date/Time   CHOL 170 02/08/2016 0814   TRIG 124 02/08/2016 0814   HDL 59 02/08/2016 0814   CHOLHDL 2.9 02/08/2016 0814   VLDL 25 02/08/2016 0814   LDLCALC 86 02/08/2016 0814     Wt Readings from Last 3 Encounters:  06/09/16 172 lb 6.4 oz (78.2 kg)  03/14/16 169 lb (76.7 kg)  12/14/15 181 lb (82.1 kg)     Other studies Reviewed: Additional studies/ records that were reviewed today include: . Review of the above records demonstrates:   Assessment and Plan:   1. Atrial fibrillation, paroxysmal: She is in sinus today. She is on metoprolol and flecainide. She has no palpitations. Continue  current meds. She is on coumadin.   Current medicines are reviewed at length with the patient today.  The patient does not have concerns regarding medicines.  The following changes have been made:  no change  Labs/ tests ordered today include:  No orders of the defined types were placed in this encounter.   Disposition:   FU with me in 6  months  Signed,  Lauree Chandler, MD 06/09/2016 2:51 PM    [facility] [address], [place], [place]  [address] Phone: [phone]; Fax: [phone]

## 2016-07-11 ENCOUNTER — Ambulatory Visit (INDEPENDENT_AMBULATORY_CARE_PROVIDER_SITE_OTHER): Payer: Medicare Other | Admitting: *Deleted

## 2016-07-11 DIAGNOSIS — I4891 Unspecified atrial fibrillation: Secondary | ICD-10-CM

## 2016-07-11 DIAGNOSIS — Z5181 Encounter for therapeutic drug level monitoring: Secondary | ICD-10-CM

## 2016-07-11 DIAGNOSIS — Z7901 Long term (current) use of anticoagulants: Secondary | ICD-10-CM

## 2016-07-11 LAB — POCT INR: INR: 2.1

## 2016-07-12 ENCOUNTER — Other Ambulatory Visit: Payer: Self-pay | Admitting: Cardiology

## 2016-07-18 ENCOUNTER — Encounter: Payer: Medicare Other | Admitting: Cardiovascular Disease

## 2016-07-21 ENCOUNTER — Other Ambulatory Visit: Payer: Self-pay | Admitting: Orthopedic Surgery

## 2016-07-21 DIAGNOSIS — M1711 Unilateral primary osteoarthritis, right knee: Secondary | ICD-10-CM

## 2016-08-05 ENCOUNTER — Ambulatory Visit
Admission: RE | Admit: 2016-08-05 | Discharge: 2016-08-05 | Disposition: A | Discharge status: Home / Self Care | Payer: Medicare Other | Source: Ambulatory Visit | Attending: Orthopedic Surgery | Admitting: Orthopedic Surgery

## 2016-08-05 DIAGNOSIS — M1711 Unilateral primary osteoarthritis, right knee: Secondary | ICD-10-CM

## 2016-08-18 ENCOUNTER — Ambulatory Visit (INDEPENDENT_AMBULATORY_CARE_PROVIDER_SITE_OTHER): Payer: Medicare Other | Admitting: *Deleted

## 2016-08-18 DIAGNOSIS — Z5181 Encounter for therapeutic drug level monitoring: Secondary | ICD-10-CM | POA: Diagnosis not present

## 2016-08-18 DIAGNOSIS — Z7901 Long term (current) use of anticoagulants: Secondary | ICD-10-CM

## 2016-08-18 DIAGNOSIS — I4891 Unspecified atrial fibrillation: Secondary | ICD-10-CM | POA: Diagnosis not present

## 2016-08-18 LAB — POCT INR: INR: 2.4

## 2016-08-31 ENCOUNTER — Other Ambulatory Visit: Payer: Self-pay | Admitting: Cardiology

## 2016-09-08 ENCOUNTER — Telehealth: Payer: Self-pay | Admitting: *Deleted

## 2016-09-08 NOTE — Telephone Encounter (Signed)
Received a msg on the voicemail that pt missed her Coumadin dose on 09/07/16, returned call to the pt & she stated that she missed her dose because she was audited by the government & her mind has been busy. Advised her to take an extra 1/2 tablet today for a total of 1 and 1/2 tablets. She verbalized understanding of the instructions given & appreciated the assistance.

## 2016-09-29 ENCOUNTER — Ambulatory Visit (INDEPENDENT_AMBULATORY_CARE_PROVIDER_SITE_OTHER): Payer: Medicare Other | Admitting: Pharmacist

## 2016-09-29 DIAGNOSIS — I4891 Unspecified atrial fibrillation: Secondary | ICD-10-CM

## 2016-09-29 DIAGNOSIS — Z5181 Encounter for therapeutic drug level monitoring: Secondary | ICD-10-CM | POA: Diagnosis not present

## 2016-09-29 DIAGNOSIS — Z7901 Long term (current) use of anticoagulants: Secondary | ICD-10-CM | POA: Diagnosis not present

## 2016-09-29 LAB — POCT INR: INR: 2.1

## 2016-10-31 ENCOUNTER — Other Ambulatory Visit: Payer: Self-pay | Admitting: Cardiology

## 2016-11-01 NOTE — Telephone Encounter (Signed)
followed by Lauree Chandler, MD

## 2016-11-10 ENCOUNTER — Ambulatory Visit (INDEPENDENT_AMBULATORY_CARE_PROVIDER_SITE_OTHER): Payer: Medicare Other | Admitting: Pharmacist

## 2016-11-10 DIAGNOSIS — I4891 Unspecified atrial fibrillation: Secondary | ICD-10-CM | POA: Diagnosis not present

## 2016-11-10 DIAGNOSIS — Z7901 Long term (current) use of anticoagulants: Secondary | ICD-10-CM | POA: Diagnosis not present

## 2016-11-10 DIAGNOSIS — Z5181 Encounter for therapeutic drug level monitoring: Secondary | ICD-10-CM

## 2016-11-10 LAB — POCT INR: INR: 2.6

## 2016-11-17 ENCOUNTER — Other Ambulatory Visit: Payer: Self-pay | Admitting: Cardiology

## 2016-11-21 ENCOUNTER — Telehealth: Payer: Self-pay | Admitting: *Deleted

## 2016-11-21 NOTE — Telephone Encounter (Signed)
Pt called to report that she missed Saturday's dose of Coumadin & on Sunday she only took a 1/2 tablet instead of 1 tablet; therefore advised pt to take 1 and 1/2 tablets today then resume normal dose of Coumadin & she verbalized understanding.  Pt will call back with any other issues.

## 2016-12-07 ENCOUNTER — Ambulatory Visit (INDEPENDENT_AMBULATORY_CARE_PROVIDER_SITE_OTHER): Payer: Medicare Other | Admitting: Cardiovascular Disease

## 2016-12-07 ENCOUNTER — Encounter: Payer: Self-pay | Admitting: Cardiovascular Disease

## 2016-12-07 ENCOUNTER — Ambulatory Visit (INDEPENDENT_AMBULATORY_CARE_PROVIDER_SITE_OTHER): Payer: Medicare Other | Admitting: *Deleted

## 2016-12-07 VITALS — BP 146/80 | HR 55 | Ht 64.0 in | Wt 171.0 lb

## 2016-12-07 DIAGNOSIS — Z7901 Long term (current) use of anticoagulants: Secondary | ICD-10-CM

## 2016-12-07 DIAGNOSIS — I4891 Unspecified atrial fibrillation: Secondary | ICD-10-CM | POA: Diagnosis not present

## 2016-12-07 DIAGNOSIS — I480 Paroxysmal atrial fibrillation: Secondary | ICD-10-CM

## 2016-12-07 DIAGNOSIS — Z5181 Encounter for therapeutic drug level monitoring: Secondary | ICD-10-CM

## 2016-12-07 LAB — POCT INR: INR: 2.8

## 2016-12-07 NOTE — Patient Instructions (Signed)

## 2016-12-07 NOTE — Progress Notes (Signed)
Chief Complaint  Patient presents with  . Follow-up    atrial fib   History of Present Illness: 82 yo female with history of paroxysmal atrial fibrillation, diet controlled DM, HLD who is here today for cardiac follow up. She had been followed by Dr. Aundra Dubin. She was originally admitted March 2013 with symptomatic atrial fib with RVR. She converted to sinus on Diltiazem drip. She was started on Xarelto and dronedarone but she could not afford these medications. She was changed to flecainide, metoprolol and coumadin. She has had no issues with recurrent atrial fibrillation over the last few years.   She is here today for follow up. The patient denies any chest pain, dyspnea, palpitations, lower extremity edema, orthopnea, PND, dizziness, near syncope or syncope.    Primary Care Physician: Orpah Melter, MD  Past Medical History:  Diagnosis Date  . Anxiety   . Arthritis    a. R>L knees  . Diabetes mellitus   . Environmental allergies   . Hiatal hernia   . Hyperlipidemia    a. statin intolerant - myalgias  . Paroxysmal atrial fibrillation ([place])    a. Dx in 2004.  Off coumadin x several yrs;  b. Normal Event Monitor 03/2011.    Past Surgical History:  Procedure Laterality Date  . ABDOMINAL HYSTERECTOMY    . CHOLECYSTECTOMY    . CYSTECTOMY     lower back  . lipoma tumor removed     right shoulder    Current Outpatient Prescriptions  Medication Sig Dispense Refill  . Calcium Carbonate-Vitamin D (CALTRATE 600+D PO) Take 1 tablet by mouth 2 (two) times daily.     . chlorthalidone (HYGROTON) 25 MG tablet Take 1 tablet (25 mg total) by mouth daily. 90 tablet 3  . cyanocobalamin 100 MCG tablet Take 100 mcg by mouth daily.      . flecainide (TAMBOCOR) 50 MG tablet TAKE ONE AND ONE-HALF TABLETS BY MOUTH TWICE DAILY 135 tablet 3  . fluticasone (FLONASE) 50 MCG/ACT nasal spray Place 1 spray into both nostrils daily as needed for allergies or rhinitis.    Marland Kitchen KLOR-CON M20 20 MEQ tablet  TAKE ONE TABLET BY MOUTH ONCE DAILY 90 tablet 2  . losartan (COZAAR) 100 MG tablet Take 100 mg by mouth daily.    . Magnesium 250 MG TABS Take 1 tablet by mouth 2 (two) times daily.   0  . meclizine (ANTIVERT) 25 MG tablet Take 1 tablet (25 mg total) by mouth 3 (three) times daily as needed for dizziness. 21 tablet 0  . metoprolol tartrate (LOPRESSOR) 25 MG tablet Take one tablet twice a day--you can take one-half 50mg  tablet twice a day    . Multiple Vitamins-Minerals (CENTRUM PO) Take 1 tablet by mouth daily.      . Omega-3 Fatty Acids (FISH OIL PO) Take 2 capsules by mouth daily.     Marland Kitchen omeprazole (PRILOSEC) 20 MG capsule Take 1 capsule (20 mg total) by mouth daily.    . tizanidine (ZANAFLEX) 2 MG capsule Take 2 mg by mouth daily as needed for muscle spasms.     Marland Kitchen warfarin (COUMADIN) 5 MG tablet TAKE BY MOUTH AS DIRECTED BY ANTICOAGULATION CLINIC 100 tablet 1   No current facility-administered medications for this visit.     Allergies  Allergen Reactions  . Statins Other (See Comments)    Atrophy of leg muscle according to patient.  . Adhesive [Tape] Itching and Rash    Pt allergic to monitor electrodes  .  Celebrex [Celecoxib] Other (See Comments)    unknown  . Clindamycin/Lincomycin Rash and Other (See Comments)  . Penicillins Other (See Comments)    unknown  . Phenergan [Promethazine Hcl] Other (See Comments)    unknown  . Robaxin [Methocarbamol] Other (See Comments)    unknown    Social History   Social History  . Marital status: Married    Spouse name: N/A  . Number of children: N/A  . Years of education: N/A   Occupational History  . Not on file.   Social History Main Topics  . Smoking status: Never Smoker  . Smokeless tobacco: Never Used  . Alcohol use No  . Drug use: No  . Sexual activity: Not on file   Other Topics Concern  . Not on file   Social History Narrative   Lives in [place] with husband.  Active @ home.    Family History  Problem  Relation Age of Onset  . Liver disease Mother        cirrhosis - died in 108's  . Heart failure Mother   . Stroke Father        died in 30's  . Coronary artery disease Sister        living  . Coronary artery disease Brother        living    Review of Systems:  As stated in the HPI and otherwise negative.   BP (!) 146/80   Pulse (!) 55   Ht 5\' 4"  (1.626 m)   Wt 171 lb (77.6 kg)   SpO2 98%   BMI 29.35 kg/m   Physical Examination:  General: Well developed, well nourished, NAD  HEENT: OP clear, mucus membranes moist  SKIN: warm, dry. No rashes. Neuro: No focal deficits  Musculoskeletal: Muscle strength 5/5 all ext  Psychiatric: Mood and affect normal  Neck: No JVD, no carotid bruits, no thyromegaly, no lymphadenopathy.  Lungs:Clear bilaterally, no wheezes, rhonci, crackles Cardiovascular: Regular rate and rhythm. No murmurs, gallops or rubs. Abdomen:Soft. Bowel sounds present. Non-tender.  Extremities: No lower extremity edema. Pulses are 2 + in the bilateral DP/PT.  EKG:  EKG is ordered today. The ekg ordered today demonstrates Sinus bradycardia, rate 55 bpm. 1st degree AV block. Incomplete LBBB  Recent Labs: 12/14/2015: ALT 19; Hemoglobin 15.6; Platelets 206 03/15/2016: BUN 20; Creatinine, Ser 0.66; Potassium 3.8; Sodium 139   Lipid Panel    Component Value Date/Time   CHOL 170 02/08/2016 0814   TRIG 124 02/08/2016 0814   HDL 59 02/08/2016 0814   CHOLHDL 2.9 02/08/2016 0814   VLDL 25 02/08/2016 0814   LDLCALC 86 02/08/2016 0814     Wt Readings from Last 3 Encounters:  12/07/16 171 lb (77.6 kg)  06/09/16 172 lb 6.4 oz (78.2 kg)  03/14/16 169 lb (76.7 kg)     Other studies Reviewed: Additional studies/ records that were reviewed today include: . Review of the above records demonstrates:   Assessment and Plan:   1. Atrial fibrillation, paroxysmal: She remains in sinus on flecainide and metoprolol. She is on coumadin. She has no palpitations. No changes  today.    Current medicines are reviewed at length with the patient today.  The patient does not have concerns regarding medicines.  The following changes have been made:  no change  Labs/ tests ordered today include:   Orders Placed This Encounter  Procedures  . EKG 12-Lead    Disposition:   FU with me in 12  months  Signed, Lauree Chandler, MD 12/07/2016 9:21 AM    [facility] [place], [place], [place]  [address] Phone: [phone]; Fax: [phone]

## 2017-01-07 ENCOUNTER — Other Ambulatory Visit: Payer: Self-pay | Admitting: Cardiology

## 2017-01-10 ENCOUNTER — Telehealth (HOSPITAL_COMMUNITY): Payer: Self-pay | Admitting: *Deleted

## 2017-01-10 ENCOUNTER — Other Ambulatory Visit (HOSPITAL_COMMUNITY): Payer: Self-pay | Admitting: *Deleted

## 2017-01-10 NOTE — Telephone Encounter (Signed)
Pt is no longer followed by Dr.McLean all heart medications need to be filled by [facility] fax #[phone].  -Flecanide script faxed to [phone]

## 2017-01-18 ENCOUNTER — Ambulatory Visit (INDEPENDENT_AMBULATORY_CARE_PROVIDER_SITE_OTHER): Payer: Medicare Other | Admitting: *Deleted

## 2017-01-18 DIAGNOSIS — I4891 Unspecified atrial fibrillation: Secondary | ICD-10-CM

## 2017-01-18 DIAGNOSIS — Z7901 Long term (current) use of anticoagulants: Secondary | ICD-10-CM | POA: Diagnosis not present

## 2017-01-18 DIAGNOSIS — Z5181 Encounter for therapeutic drug level monitoring: Secondary | ICD-10-CM | POA: Diagnosis not present

## 2017-01-18 LAB — POCT INR: INR: 2.6

## 2017-02-27 ENCOUNTER — Telehealth: Payer: Self-pay | Admitting: *Deleted

## 2017-02-27 NOTE — Telephone Encounter (Signed)
Pt called & stated she forgot last night's dose of Coumadin. Pt normally takes her dose at 9pm. Advised pt that she can take her missed dose within 12 hours of missing. Since she takes at 9pm & the time is 840am the pt was advised to take. Pt will continue on regular & dose & call back with any other questions.

## 2017-03-01 ENCOUNTER — Ambulatory Visit (INDEPENDENT_AMBULATORY_CARE_PROVIDER_SITE_OTHER): Payer: Medicare Other | Admitting: *Deleted

## 2017-03-01 DIAGNOSIS — Z7901 Long term (current) use of anticoagulants: Secondary | ICD-10-CM | POA: Diagnosis not present

## 2017-03-01 DIAGNOSIS — Z5181 Encounter for therapeutic drug level monitoring: Secondary | ICD-10-CM

## 2017-03-01 DIAGNOSIS — I4891 Unspecified atrial fibrillation: Secondary | ICD-10-CM

## 2017-03-01 LAB — POCT INR: INR: 3.2

## 2017-03-01 NOTE — Patient Instructions (Signed)
Today take 1/2 tablet then continue taking 1 tablet daily except 1/2 tablet each Tuesday and Saturday.  Repeat INR in 4 weeks. Call if scheduled for any surgery or any new medications #[phone] [facility].

## 2017-03-20 ENCOUNTER — Telehealth: Payer: Self-pay | Admitting: *Deleted

## 2017-03-20 NOTE — Telephone Encounter (Signed)
Pt called to inform us that on yesterday she took her meds in the AM instead of in the PM. Advised that it was ok as she took then a couple hours earlier than what she normally does & she did not retake. She will call back with any other issues.

## 2017-03-21 ENCOUNTER — Other Ambulatory Visit: Payer: Self-pay | Admitting: Cardiology

## 2017-03-30 ENCOUNTER — Ambulatory Visit (INDEPENDENT_AMBULATORY_CARE_PROVIDER_SITE_OTHER): Payer: Medicare Other | Admitting: *Deleted

## 2017-03-30 DIAGNOSIS — Z5181 Encounter for therapeutic drug level monitoring: Secondary | ICD-10-CM

## 2017-03-30 DIAGNOSIS — Z7901 Long term (current) use of anticoagulants: Secondary | ICD-10-CM | POA: Diagnosis not present

## 2017-03-30 DIAGNOSIS — I4891 Unspecified atrial fibrillation: Secondary | ICD-10-CM

## 2017-03-30 LAB — POCT INR: INR: 3

## 2017-03-30 NOTE — Patient Instructions (Signed)
Description   Continue taking 1 tablet daily except 1/2 tablet each Tuesday and Saturday.  Repeat INR in 4 weeks. Call if scheduled for any surgery or any new medications #[phone] [facility].

## 2017-04-27 ENCOUNTER — Encounter (INDEPENDENT_AMBULATORY_CARE_PROVIDER_SITE_OTHER): Payer: Self-pay

## 2017-04-27 ENCOUNTER — Ambulatory Visit (INDEPENDENT_AMBULATORY_CARE_PROVIDER_SITE_OTHER): Payer: Medicare Other | Admitting: Pharmacist

## 2017-04-27 DIAGNOSIS — Z7901 Long term (current) use of anticoagulants: Secondary | ICD-10-CM

## 2017-04-27 DIAGNOSIS — Z5181 Encounter for therapeutic drug level monitoring: Secondary | ICD-10-CM

## 2017-04-27 DIAGNOSIS — I4891 Unspecified atrial fibrillation: Secondary | ICD-10-CM

## 2017-04-27 LAB — POCT INR: INR: 3.2

## 2017-04-27 NOTE — Patient Instructions (Signed)
Description   Take 1/2 tablet today, then continue taking 1 tablet daily except 1/2 tablet each Tuesday and Saturday.  Repeat INR in 4 weeks. Call if scheduled for any surgery or any new medications #[phone] [facility].

## 2017-05-30 ENCOUNTER — Ambulatory Visit (INDEPENDENT_AMBULATORY_CARE_PROVIDER_SITE_OTHER): Payer: Medicare Other | Admitting: *Deleted

## 2017-05-30 DIAGNOSIS — Z5181 Encounter for therapeutic drug level monitoring: Secondary | ICD-10-CM

## 2017-05-30 DIAGNOSIS — Z7901 Long term (current) use of anticoagulants: Secondary | ICD-10-CM

## 2017-05-30 DIAGNOSIS — I4891 Unspecified atrial fibrillation: Secondary | ICD-10-CM | POA: Diagnosis not present

## 2017-05-30 LAB — POCT INR: INR: 2.9

## 2017-05-30 NOTE — Patient Instructions (Addendum)
Description   Continue taking 1 tablet daily except 1/2 tablet each Tuesday and Saturday.  Repeat INR in 5 weeks. Call if scheduled for any surgery or any new medications #[phone] [facility].

## 2017-07-04 ENCOUNTER — Ambulatory Visit (INDEPENDENT_AMBULATORY_CARE_PROVIDER_SITE_OTHER): Payer: Medicare Other | Admitting: *Deleted

## 2017-07-04 DIAGNOSIS — Z5181 Encounter for therapeutic drug level monitoring: Secondary | ICD-10-CM | POA: Diagnosis not present

## 2017-07-04 DIAGNOSIS — I4891 Unspecified atrial fibrillation: Secondary | ICD-10-CM | POA: Diagnosis not present

## 2017-07-04 DIAGNOSIS — Z7901 Long term (current) use of anticoagulants: Secondary | ICD-10-CM | POA: Diagnosis not present

## 2017-07-04 LAB — POCT INR: INR: 3.1

## 2017-07-04 NOTE — Patient Instructions (Signed)
Description   Today and Tomorrow take 1/2 tablet, then Continue taking 1 tablet daily except 1/2 tablet each Tuesday and Saturday. Increase your leafy green vegetable intake to 3 servings each week.  Repeat INR in 4 weeks. Call if scheduled for any surgery or any new medications #[phone] [facility].

## 2017-08-01 ENCOUNTER — Ambulatory Visit (INDEPENDENT_AMBULATORY_CARE_PROVIDER_SITE_OTHER): Payer: Medicare Other | Admitting: *Deleted

## 2017-08-01 DIAGNOSIS — Z7901 Long term (current) use of anticoagulants: Secondary | ICD-10-CM

## 2017-08-01 DIAGNOSIS — Z5181 Encounter for therapeutic drug level monitoring: Secondary | ICD-10-CM | POA: Diagnosis not present

## 2017-08-01 DIAGNOSIS — I4891 Unspecified atrial fibrillation: Secondary | ICD-10-CM

## 2017-08-01 LAB — POCT INR: INR: 2.2

## 2017-08-01 NOTE — Patient Instructions (Signed)
Description   Continue taking 1 tablet daily except 1/2 tablet each Tuesday and Saturday. Continue eating leafy green vegetable intake of 3 servings each week.  Repeat INR in 4 weeks. Call if scheduled for any surgery or any new medications #[phone] [facility].

## 2017-08-04 ENCOUNTER — Other Ambulatory Visit: Payer: Self-pay | Admitting: Cardiovascular Disease

## 2017-08-29 ENCOUNTER — Ambulatory Visit (INDEPENDENT_AMBULATORY_CARE_PROVIDER_SITE_OTHER): Payer: Medicare Other | Admitting: *Deleted

## 2017-08-29 DIAGNOSIS — Z7901 Long term (current) use of anticoagulants: Secondary | ICD-10-CM

## 2017-08-29 DIAGNOSIS — I4891 Unspecified atrial fibrillation: Secondary | ICD-10-CM | POA: Diagnosis not present

## 2017-08-29 DIAGNOSIS — Z5181 Encounter for therapeutic drug level monitoring: Secondary | ICD-10-CM

## 2017-08-29 LAB — POCT INR: INR: 3.3 — AB (ref 2.0–3.0)

## 2017-08-29 NOTE — Patient Instructions (Signed)
Description   Skip today's dose, then Continue taking 1 tablet daily except 1/2 tablet each Tuesday and Saturday. Continue eating leafy green vegetable intake of 3 servings each week.  Repeat INR in 3 weeks. Call if scheduled for any surgery or any new medications #[phone] [facility].

## 2017-09-19 ENCOUNTER — Encounter (INDEPENDENT_AMBULATORY_CARE_PROVIDER_SITE_OTHER): Payer: Self-pay

## 2017-09-19 ENCOUNTER — Ambulatory Visit (INDEPENDENT_AMBULATORY_CARE_PROVIDER_SITE_OTHER): Payer: Medicare Other | Admitting: *Deleted

## 2017-09-19 DIAGNOSIS — Z5181 Encounter for therapeutic drug level monitoring: Secondary | ICD-10-CM | POA: Diagnosis not present

## 2017-09-19 DIAGNOSIS — I4891 Unspecified atrial fibrillation: Secondary | ICD-10-CM

## 2017-09-19 DIAGNOSIS — Z7901 Long term (current) use of anticoagulants: Secondary | ICD-10-CM

## 2017-09-19 LAB — POCT INR: INR: 2.6 (ref 2.0–3.0)

## 2017-09-19 NOTE — Patient Instructions (Signed)
Description   Continue taking 1 tablet daily except 1/2 tablet each Tuesday and Saturday. Continue eating leafy green vegetable intake of 3 servings each week.  Repeat INR in 4 weeks. Call if scheduled for any surgery or any new medications #[phone] [facility].

## 2017-10-17 ENCOUNTER — Ambulatory Visit (INDEPENDENT_AMBULATORY_CARE_PROVIDER_SITE_OTHER): Payer: Medicare Other | Admitting: *Deleted

## 2017-10-17 DIAGNOSIS — Z7901 Long term (current) use of anticoagulants: Secondary | ICD-10-CM | POA: Diagnosis not present

## 2017-10-17 DIAGNOSIS — I4891 Unspecified atrial fibrillation: Secondary | ICD-10-CM | POA: Diagnosis not present

## 2017-10-17 DIAGNOSIS — Z5181 Encounter for therapeutic drug level monitoring: Secondary | ICD-10-CM

## 2017-10-17 LAB — POCT INR: INR: 2.9 (ref 2.0–3.0)

## 2017-10-17 NOTE — Patient Instructions (Signed)
Description   Continue taking 1 tablet daily except 1/2 tablet each Tuesday and Saturday. Continue eating leafy green vegetable intake of 3 servings each week.  Repeat INR in 5 weeks. Call if scheduled for any surgery or any new medications #[phone] [facility].

## 2017-11-21 ENCOUNTER — Ambulatory Visit: Payer: Medicare Other

## 2017-11-21 ENCOUNTER — Encounter (INDEPENDENT_AMBULATORY_CARE_PROVIDER_SITE_OTHER): Payer: Self-pay

## 2017-11-21 DIAGNOSIS — I4891 Unspecified atrial fibrillation: Secondary | ICD-10-CM

## 2017-11-21 DIAGNOSIS — Z5181 Encounter for therapeutic drug level monitoring: Secondary | ICD-10-CM

## 2017-11-21 DIAGNOSIS — Z7901 Long term (current) use of anticoagulants: Secondary | ICD-10-CM

## 2017-11-21 LAB — POCT INR: INR: 2.9 (ref 2.0–3.0)

## 2017-11-21 NOTE — Patient Instructions (Signed)
Continue taking 1 tablet daily except 1/2 tablet each Tuesday and Saturday. Continue eating leafy green vegetable intake of 3 servings each week.  Repeat INR in 6 weeks. Call if scheduled for any surgery or any new medications #[phone] [facility].

## 2017-11-27 ENCOUNTER — Other Ambulatory Visit: Payer: Self-pay | Admitting: Cardiology

## 2017-12-05 NOTE — Progress Notes (Signed)
Chief Complaint  Patient presents with  . Follow-up    atrial fibrillation   History of Present Illness: 83 yo female with history of paroxysmal atrial fibrillation, diet controlled DM, HLD who is here today for cardiac follow up. She had been followed by Dr. Aundra Dubin. She was originally admitted March 2013 with symptomatic atrial fib with RVR. She converted to sinus on Diltiazem drip. She was started on Xarelto and dronedarone but she could not afford these medications. She was changed to flecainide, metoprolol and coumadin. She has had no issues with recurrent atrial fibrillation over the last few years.   She is here today for follow up. The patient denies any chest pain, dyspnea, palpitations, lower extremity edema, orthopnea, PND, dizziness, near syncope or syncope.   Primary Care Physician: Orpah Melter, MD  Past Medical History:  Diagnosis Date  . Anxiety   . Arthritis    a. R>L knees  . Diabetes mellitus   . Environmental allergies   . Hiatal hernia   . Hyperlipidemia    a. statin intolerant - myalgias  . Paroxysmal atrial fibrillation ([place])    a. Dx in 2004.  Off coumadin x several yrs;  b. Normal Event Monitor 03/2011.    Past Surgical History:  Procedure Laterality Date  . ABDOMINAL HYSTERECTOMY    . CHOLECYSTECTOMY    . CYSTECTOMY     lower back  . lipoma tumor removed     right shoulder    Current Outpatient Medications  Medication Sig Dispense Refill  . Calcium Carbonate-Vitamin D (CALTRATE 600+D PO) Take 1 tablet by mouth 2 (two) times daily.     . chlorthalidone (HYGROTON) 25 MG tablet Take 1 tablet (25 mg total) by mouth daily. 90 tablet 3  . cyanocobalamin 100 MCG tablet Take 100 mcg by mouth daily.      . flecainide (TAMBOCOR) 50 MG tablet TAKE 1 & 1/2 (ONE & ONE-HALF) TABLETS BY MOUTH TWICE DAILY 270 tablet 3  . fluticasone (FLONASE) 50 MCG/ACT nasal spray Place 1 spray into both nostrils daily as needed for allergies or rhinitis.    Marland Kitchen losartan  (COZAAR) 100 MG tablet Take 100 mg by mouth daily.    . Magnesium 250 MG TABS Take 1 tablet by mouth 2 (two) times daily.   0  . metoprolol tartrate (LOPRESSOR) 25 MG tablet Take one tablet twice a day--you can take one-half 50mg  tablet twice a day    . Multiple Vitamins-Minerals (CENTRUM PO) Take 1 tablet by mouth daily.      . Omega-3 Fatty Acids (FISH OIL PO) Take 2 capsules by mouth daily.     Marland Kitchen omeprazole (PRILOSEC) 20 MG capsule Take 1 capsule (20 mg total) by mouth daily.    . potassium chloride SA (K-DUR,KLOR-CON) 20 MEQ tablet Take 1 tablet (20 mEq total) by mouth daily. Please keep upcoming appt for future refills. Thank you 90 tablet 1  . tizanidine (ZANAFLEX) 2 MG capsule Take 2 mg by mouth daily as needed for muscle spasms.     Marland Kitchen warfarin (COUMADIN) 5 MG tablet TAKE BY MOUTH AS DIRECTED BY ANTICOAGULATION CLINIC 100 tablet 1   No current facility-administered medications for this visit.     Allergies  Allergen Reactions  . Statins Other (See Comments)    Atrophy of leg muscle according to patient.  . Adhesive [Tape] Itching and Rash    Pt allergic to monitor electrodes  . Celebrex [Celecoxib] Other (See Comments)    unknown  .  Clindamycin/Lincomycin Rash and Other (See Comments)  . Penicillins Other (See Comments)    unknown  . Phenergan [Promethazine Hcl] Other (See Comments)    unknown  . Robaxin [Methocarbamol] Other (See Comments)    unknown    Social History   Socioeconomic History  . Marital status: Married    Spouse name: Not on file  . Number of children: Not on file  . Years of education: Not on file  . Highest education level: Not on file  Occupational History  . Not on file  Social Needs  . Financial resource strain: Not on file  . Food insecurity:    Worry: Not on file    Inability: Not on file  . Transportation needs:    Medical: Not on file    Non-medical: Not on file  Tobacco Use  . Smoking status: Never Smoker  . Smokeless tobacco: Never  Used  Substance and Sexual Activity  . Alcohol use: No  . Drug use: No  . Sexual activity: Not on file  Lifestyle  . Physical activity:    Days per week: Not on file    Minutes per session: Not on file  . Stress: Not on file  Relationships  . Social connections:    Talks on phone: Not on file    Gets together: Not on file    Attends religious service: Not on file    Active member of club or organization: Not on file    Attends meetings of clubs or organizations: Not on file    Relationship status: Not on file  . Intimate partner violence:    Fear of current or ex partner: Not on file    Emotionally abused: Not on file    Physically abused: Not on file    Forced sexual activity: Not on file  Other Topics Concern  . Not on file  Social History Narrative   Lives in [place] with husband.  Active @ home.    Family History  Problem Relation Age of Onset  . Liver disease Mother        cirrhosis - died in 11's  . Heart failure Mother   . Stroke Father        died in 75's  . Coronary artery disease Sister        living  . Coronary artery disease Brother        living    Review of Systems:  As stated in the HPI and otherwise negative.   BP (!) 148/80   Pulse (!) 52   Ht 5\' 4"  (1.626 m)   Wt 172 lb 12.8 oz (78.4 kg)   SpO2 97%   BMI 29.66 kg/m   Physical Examination:  General: Well developed, well nourished, NAD  HEENT: OP clear, mucus membranes moist  SKIN: warm, dry. No rashes. Neuro: No focal deficits  Musculoskeletal: Muscle strength 5/5 all ext  Psychiatric: Mood and affect normal  Neck: No JVD, no carotid bruits, no thyromegaly, no lymphadenopathy.  Lungs:Clear bilaterally, no wheezes, rhonci, crackles Cardiovascular: Regular rate and rhythm. No murmurs, gallops or rubs. Abdomen:Soft. Bowel sounds present. Non-tender.  Extremities: No lower extremity edema. Pulses are 2 + in the bilateral DP/PT.  EKG:  EKG is ordered today. The ekg ordered today  demonstrates Sinus bradycardia, 52 bpm. 1st degree AV block. Poor R wave progression.   Recent Labs: No results found for requested labs within last 8760 hours.   Lipid Panel    Component  Value Date/Time   CHOL 170 02/08/2016 0814   TRIG 124 02/08/2016 0814   HDL 59 02/08/2016 0814   CHOLHDL 2.9 02/08/2016 0814   VLDL 25 02/08/2016 0814   LDLCALC 86 02/08/2016 0814     Wt Readings from Last 3 Encounters:  12/06/17 172 lb 12.8 oz (78.4 kg)  12/07/16 171 lb (77.6 kg)  06/09/16 172 lb 6.4 oz (78.2 kg)     Other studies Reviewed: Additional studies/ records that were reviewed today include: . Review of the above records demonstrates:   Assessment and Plan:   1. Atrial fibrillation, paroxysmal: She is in sinus today. Will continue Flecainide, metoprolol and coumadin.   I will repeat an echo now to assess her LV systolic function.   Current medicines are reviewed at length with the patient today.  The patient does not have concerns regarding medicines.  The following changes have been made:  no change  Labs/ tests ordered today include:   Orders Placed This Encounter  Procedures  . EKG 12-Lead  . ECHOCARDIOGRAM COMPLETE   Disposition:   FU with me in 12  months  Signed, Lauree Chandler, MD 12/06/2017 9:16 AM    [facility] [address], [place], [place]  [address] Phone: [phone]; Fax: [phone]

## 2017-12-06 ENCOUNTER — Encounter: Payer: Self-pay | Admitting: Cardiovascular Disease

## 2017-12-06 ENCOUNTER — Ambulatory Visit: Payer: Medicare Other | Admitting: Cardiovascular Disease

## 2017-12-06 VITALS — BP 148/80 | HR 52 | Ht 64.0 in | Wt 172.8 lb

## 2017-12-06 DIAGNOSIS — I480 Paroxysmal atrial fibrillation: Secondary | ICD-10-CM

## 2017-12-06 NOTE — Patient Instructions (Signed)
Medication Instructions:  Your physician recommends that you continue on your current medications as directed. Please refer to the Current Medication list given to you today.   Labwork: none  Testing/Procedures: Your physician has requested that you have an echocardiogram. Echocardiography is a painless test that uses sound waves to create images of your heart. It provides your doctor with information about the size and shape of your heart and how well your heart's chambers and valves are working. This procedure takes approximately one hour. There are no restrictions for this procedure.    Follow-Up: Your physician recommends that you schedule a follow-up appointment in: 12 months. Please call our office in about 8 months to schedule this appointment    Any Other Special Instructions Will Be Listed Below (If Applicable).     If you need a refill on your cardiac medications before your next appointment, please call your pharmacy.

## 2017-12-14 ENCOUNTER — Ambulatory Visit (HOSPITAL_COMMUNITY): Payer: Medicare Other | Attending: Cardiology

## 2017-12-14 ENCOUNTER — Other Ambulatory Visit: Payer: Self-pay

## 2017-12-14 DIAGNOSIS — E119 Type 2 diabetes mellitus without complications: Secondary | ICD-10-CM | POA: Insufficient documentation

## 2017-12-14 DIAGNOSIS — E785 Hyperlipidemia, unspecified: Secondary | ICD-10-CM | POA: Diagnosis not present

## 2017-12-14 DIAGNOSIS — I340 Nonrheumatic mitral (valve) insufficiency: Secondary | ICD-10-CM | POA: Diagnosis not present

## 2017-12-14 DIAGNOSIS — I517 Cardiomegaly: Secondary | ICD-10-CM | POA: Insufficient documentation

## 2017-12-14 DIAGNOSIS — I480 Paroxysmal atrial fibrillation: Secondary | ICD-10-CM | POA: Diagnosis not present

## 2018-01-01 ENCOUNTER — Other Ambulatory Visit: Payer: Self-pay | Admitting: Cardiology

## 2018-01-02 ENCOUNTER — Encounter (INDEPENDENT_AMBULATORY_CARE_PROVIDER_SITE_OTHER): Payer: Self-pay

## 2018-01-02 ENCOUNTER — Ambulatory Visit: Payer: Medicare Other

## 2018-01-02 ENCOUNTER — Other Ambulatory Visit: Payer: Self-pay | Admitting: Cardiology

## 2018-01-02 DIAGNOSIS — I4891 Unspecified atrial fibrillation: Secondary | ICD-10-CM

## 2018-01-02 DIAGNOSIS — Z5181 Encounter for therapeutic drug level monitoring: Secondary | ICD-10-CM | POA: Diagnosis not present

## 2018-01-02 DIAGNOSIS — Z7901 Long term (current) use of anticoagulants: Secondary | ICD-10-CM

## 2018-01-02 LAB — POCT INR: INR: 3.6 — AB (ref 2.0–3.0)

## 2018-01-02 NOTE — Patient Instructions (Signed)
Please skip coumadin tonight, take 1/2 tablet tomorrow, then continue taking 1 tablet daily except 1/2 tablet each Tuesday and Saturday. Continue eating leafy green vegetable intake of 3 servings each week.  Repeat INR in 4 weeks. Call if scheduled for any surgery or any new medications #[phone] [facility].

## 2018-01-04 ENCOUNTER — Other Ambulatory Visit: Payer: Self-pay

## 2018-01-04 MED ORDER — FLECAINIDE ACETATE 50 MG PO TABS: ORAL_TABLET | ORAL | 3 refills | Status: DC

## 2018-01-24 ENCOUNTER — Other Ambulatory Visit: Payer: Self-pay | Admitting: Cardiovascular Disease

## 2018-01-30 ENCOUNTER — Ambulatory Visit: Payer: Medicare Other | Admitting: *Deleted

## 2018-01-30 DIAGNOSIS — I4891 Unspecified atrial fibrillation: Secondary | ICD-10-CM

## 2018-01-30 DIAGNOSIS — Z7901 Long term (current) use of anticoagulants: Secondary | ICD-10-CM

## 2018-01-30 DIAGNOSIS — Z5181 Encounter for therapeutic drug level monitoring: Secondary | ICD-10-CM

## 2018-01-30 LAB — POCT INR: INR: 3.1 — AB (ref 2.0–3.0)

## 2018-01-30 NOTE — Patient Instructions (Addendum)
Description   Do not take any Coumadin today then start taking 1 tablet daily except 1/2 tablet each Tuesday, Thursday, and Saturday. Continue eating leafy green vegetable intake of 3 servings each week.  Repeat INR in 3 weeks. Call if scheduled for any surgery or any new medications [phone] [facility].

## 2018-02-20 ENCOUNTER — Ambulatory Visit: Payer: Medicare Other | Admitting: *Deleted

## 2018-02-20 DIAGNOSIS — I4891 Unspecified atrial fibrillation: Secondary | ICD-10-CM

## 2018-02-20 DIAGNOSIS — Z7901 Long term (current) use of anticoagulants: Secondary | ICD-10-CM

## 2018-02-20 DIAGNOSIS — Z5181 Encounter for therapeutic drug level monitoring: Secondary | ICD-10-CM | POA: Diagnosis not present

## 2018-02-20 LAB — POCT INR: INR: 2.3 (ref 2.0–3.0)

## 2018-02-20 NOTE — Patient Instructions (Signed)
Description   Continue taking 1 tablet daily except 1/2 tablet each Tuesday, Thursday, and Saturday. Continue eating leafy green vegetable intake of 3 servings each week.  Repeat INR in 4 weeks. Call if scheduled for any surgery or any new medications [phone] [facility].

## 2018-03-09 ENCOUNTER — Emergency Department (HOSPITAL_COMMUNITY): Payer: Medicare Other

## 2018-03-09 ENCOUNTER — Emergency Department (HOSPITAL_COMMUNITY)
Admission: EM | Admit: 2018-03-09 | Discharge: 2018-03-09 | Disposition: A | Discharge status: Home / Self Care | Payer: Medicare Other | Attending: Emergency Medicine | Admitting: Emergency Medicine

## 2018-03-09 ENCOUNTER — Other Ambulatory Visit: Payer: Self-pay

## 2018-03-09 ENCOUNTER — Encounter (HOSPITAL_COMMUNITY): Payer: Self-pay

## 2018-03-09 DIAGNOSIS — W010XXA Fall on same level from slipping, tripping and stumbling without subsequent striking against object, initial encounter: Secondary | ICD-10-CM | POA: Insufficient documentation

## 2018-03-09 DIAGNOSIS — Y939 Activity, unspecified: Secondary | ICD-10-CM | POA: Diagnosis not present

## 2018-03-09 DIAGNOSIS — Y929 Unspecified place or not applicable: Secondary | ICD-10-CM | POA: Diagnosis not present

## 2018-03-09 DIAGNOSIS — E119 Type 2 diabetes mellitus without complications: Secondary | ICD-10-CM | POA: Insufficient documentation

## 2018-03-09 DIAGNOSIS — S82854A Nondisplaced trimalleolar fracture of right lower leg, initial encounter for closed fracture: Secondary | ICD-10-CM | POA: Diagnosis not present

## 2018-03-09 DIAGNOSIS — Y999 Unspecified external cause status: Secondary | ICD-10-CM | POA: Diagnosis not present

## 2018-03-09 DIAGNOSIS — Z79899 Other long term (current) drug therapy: Secondary | ICD-10-CM | POA: Diagnosis not present

## 2018-03-09 DIAGNOSIS — Z7901 Long term (current) use of anticoagulants: Secondary | ICD-10-CM | POA: Diagnosis not present

## 2018-03-09 DIAGNOSIS — S99911A Unspecified injury of right ankle, initial encounter: Secondary | ICD-10-CM | POA: Diagnosis present

## 2018-03-09 DIAGNOSIS — S0990XA Unspecified injury of head, initial encounter: Secondary | ICD-10-CM | POA: Diagnosis not present

## 2018-03-09 DIAGNOSIS — S82851A Displaced trimalleolar fracture of right lower leg, initial encounter for closed fracture: Secondary | ICD-10-CM

## 2018-03-09 MED ORDER — HYDROCODONE-ACETAMINOPHEN 5-325 MG PO TABS: 1.0000 | ORAL_TABLET | Freq: Four times a day (QID) | ORAL | 0 refills | Status: DC | PRN

## 2018-03-09 NOTE — ED Provider Notes (Signed)
[facility] DEPT Provider Note   CSN: [id number] Arrival date & time: 03/09/18  1658     History   Chief Complaint Chief Complaint  Patient presents with  . Fall    HPI Emily Andrews is a 83 y.o. female.  HPI Patient presents to the emergency department with injuries following a fall.  The patient was attempting to get into a van when she lost her balance and fell down to the ground.  Patient states she twisted her right ankle.  She also hit the back of her head.  The patient states she did not lose consciousness.  She states that she has trouble with her knees and that is what she feels like was causing most of the problem was trying to bend her knees enough to get into the [place].  Patient states that she has no other injuries at this time.  Patient denies headache, blurred vision, weakness, numbness, dizziness, chest pain, shortness of breath, incontinence, hip pain, shoulder pain, nausea, vomiting or syncope. Past Medical History:  Diagnosis Date  . Anxiety   . Arthritis    a. R>L knees  . Diabetes mellitus   . Environmental allergies   . Hiatal hernia   . Hyperlipidemia    a. statin intolerant - myalgias  . Paroxysmal atrial fibrillation ([place])    a. Dx in 2004.  Off coumadin x several yrs;  b. Normal Event Monitor 03/2011.    Patient Active Problem List   Diagnosis Date Noted  . Encounter for therapeutic drug monitoring 05/20/2013  . Long term (current) use of anticoagulants 07/27/2011  . Atrial fibrillation with rapid ventricular response ([place]) 06/27/2011  . Elevated LFTs 02/16/2011  . Hypertension 08/18/2010  . Atrial fibrillation ([place]) 08/18/2010  . Hypercholesterolemia 08/18/2010  . Degenerative arthritis of cervical spine 08/18/2010    Past Surgical History:  Procedure Laterality Date  . ABDOMINAL HYSTERECTOMY    . CHOLECYSTECTOMY    . CYSTECTOMY     lower back  . lipoma tumor removed     right shoulder     OB  History   None      Home Medications    Prior to Admission medications   Medication Sig Start Date End Date Taking? Authorizing Provider  Calcium Carbonate-Vitamin D (CALTRATE 600+D PO) Take 1 tablet by mouth 2 (two) times daily.     [provider]  chlorthalidone (HYGROTON) 25 MG tablet Take 1 tablet (25 mg total) by mouth daily. 08/07/14   Larey Dresser, MD  cyanocobalamin 100 MCG tablet Take 100 mcg by mouth daily.      [provider]  flecainide (TAMBOCOR) 50 MG tablet TAKE 1 & 1/2 (ONE & ONE-HALF) TABLETS BY MOUTH TWICE DAILY 01/04/18   Burnell Blanks, MD  fluticasone (FLONASE) 50 MCG/ACT nasal spray Place 1 spray into both nostrils daily as needed for allergies or rhinitis.    [provider]  losartan (COZAAR) 100 MG tablet Take 100 mg by mouth daily.    [provider]  Magnesium 250 MG TABS Take 1 tablet by mouth 2 (two) times daily.  08/12/11   Larey Dresser, MD  metoprolol tartrate (LOPRESSOR) 25 MG tablet Take one tablet twice a day--you can take one-half 50mg  tablet twice a day 08/12/11   Larey Dresser, MD  Multiple Vitamins-Minerals (CENTRUM PO) Take 1 tablet by mouth daily.      [provider]  Omega-3 Fatty Acids (FISH OIL PO) Take  2 capsules by mouth daily.     [provider]  omeprazole (PRILOSEC) 20 MG capsule Take 1 capsule (20 mg total) by mouth daily. 08/12/11   Larey Dresser, MD  potassium chloride SA (K-DUR,KLOR-CON) 20 MEQ tablet TAKE 1 TABLET BY MOUTH DAILY 01/24/18   Burnell Blanks, MD  tizanidine (ZANAFLEX) 2 MG capsule Take 2 mg by mouth daily as needed for muscle spasms.     [provider]  warfarin (COUMADIN) 5 MG tablet TAKE BY MOUTH AS DIRECTED BY ANTICOAGULATION CLINIC 11/28/17   Burnell Blanks, MD    Family History Family History  Problem Relation Age of Onset  . Liver disease Mother        cirrhosis - died in 9's  . Heart failure Mother   . Stroke  Father        died in 38's  . Coronary artery disease Sister        living  . Coronary artery disease Brother        living    Social History Social History   Tobacco Use  . Smoking status: Never Smoker  . Smokeless tobacco: Never Used  Substance Use Topics  . Alcohol use: No  . Drug use: No     Allergies   Statins; Adhesive [tape]; Celebrex [celecoxib]; Clindamycin/lincomycin; Penicillins; Phenergan [promethazine hcl]; and Robaxin [methocarbamol]   Review of Systems Review of Systems  All other systems negative except as documented in the HPI. All pertinent positives and negatives as reviewed in the HPI. Physical Exam Updated Vital Signs BP (!) 146/86   Pulse 70   Temp 98 F (36.7 C)   Resp 18   Ht 5\' 4"  (1.626 m)   Wt 75.3 kg   SpO2 96%   BMI 28.49 kg/m   Physical Exam  Constitutional: She is oriented to person, place, and time. She appears well-developed and well-nourished. No distress.  HENT:  Head: Normocephalic.    Mouth/Throat: Oropharynx is clear and moist.  Eyes: Pupils are equal, round, and reactive to light.  Neck: Normal range of motion. Neck supple.  Cardiovascular: Normal rate, regular rhythm and normal heart sounds. Exam reveals no gallop and no friction rub.  No murmur heard. Pulmonary/Chest: Effort normal and breath sounds normal. No respiratory distress. She has no wheezes.  Musculoskeletal:       Right ankle: She exhibits decreased range of motion, swelling and ecchymosis. She exhibits no deformity, no laceration and normal pulse. Tenderness. Lateral malleolus and medial malleolus tenderness found.  Neurological: She is alert and oriented to person, place, and time. No sensory deficit. She exhibits normal muscle tone. Coordination normal.  Skin: Skin is warm and dry. Capillary refill takes less than 2 seconds. No rash noted. No erythema.  Psychiatric: She has a normal mood and affect. Her behavior is normal.  Nursing note and vitals  reviewed.    ED Treatments / Results  Labs (all labs ordered are listed, but only abnormal results are displayed) Labs Reviewed - No data to display  EKG None  Radiology No results found.  Procedures Procedures (including critical care time)  Medications Ordered in ED Medications - No data to display   Initial Impression / Assessment and Plan / ED Course  I have reviewed the triage vital signs and the nursing notes.  Pertinent labs & imaging results that were available during my care of the patient were reviewed by me and considered in my medical decision making (see chart for  details).     Spoke with Dr. Doran Durand who is on-call for Dr. Wynelle Link and he advised placing the patient in a posterior short leg splint along with a stirrup.  He also advised to have her elevate as much as possible.  I advised the patient that she must be nonweightbearing on that extremity.  Patient agrees to the plan and all questions were answered.  Advised her to call Dr. Jeoffrey Massed office on Monday morning for an appointment.  SPLINT APPLICATION Date/Time: 21:58 PM Authorized by: Resa Miner Chella Chapdelaine Consent: Verbal consent obtained. Risks and benefits: risks, benefits and alternatives were discussed Consent given by: patient Splint applied by: orthopedic technician Location details: Right ankle and lower leg Splint type: Posterior short leg and stirrup Supplies used: Web roll and fiberglass Post-procedure: The splinted body part was neurovascularly unchanged following the procedure. Patient tolerance: Patient tolerated the procedure well with no immediate complications.     Final Clinical Impressions(s) / ED Diagnoses   Final diagnoses:  None    ED Discharge Orders    None       Rebeca Allegra 03/09/18 2224    Julianne Rice, MD 03/09/18 2332

## 2018-03-09 NOTE — ED Notes (Signed)
Pt and family verbalized discharge instructions and follow up care

## 2018-03-09 NOTE — ED Triage Notes (Signed)
Pt states that she went to get into neighbors [employer] and got caught up. Pt states that she hit her head and sprained her right ankle. Pt states that she had no LOC. Pt states that she takes coumadin, but has not taken it today.

## 2018-03-09 NOTE — Discharge Instructions (Addendum)
Return here as needed. Follow up with your orthopedist.

## 2018-03-14 ENCOUNTER — Ambulatory Visit: Payer: Self-pay | Admitting: Podiatry

## 2018-03-14 ENCOUNTER — Telehealth: Payer: Self-pay | Admitting: *Deleted

## 2018-03-14 NOTE — Telephone Encounter (Signed)
   [facility] Pre-operative Risk Assessment    Request for surgical clearance:  1. What type of surgery is being performed? RIGHT ANKLE ORIF   2. When is this surgery scheduled? TBD   3. Are there any medications that need to be held prior to surgery and how long? WARFARIN   4. Practice name and name of physician performing surgery? [facility]   5. What is your office phone and fax number? PH# [phone]; FAX# [phone]   3. Anesthesia type (None, local, MAC, general) ? LEFT MESSAGE FOR ORTHO TO VERIFY ANESTHESIA    [name] 03/14/2018, 1:22 PM  _________________________________________________________________   (provider comments below)

## 2018-03-14 NOTE — Telephone Encounter (Signed)
[facility] verified that the anesthesia will be Choice

## 2018-03-14 NOTE — Progress Notes (Signed)
Please place orders in Epic as patient is being scheduled for a pre-op appointment! Thank you! 

## 2018-03-16 ENCOUNTER — Telehealth: Payer: Self-pay | Admitting: *Deleted

## 2018-03-16 NOTE — Telephone Encounter (Signed)
Patient with diagnosis of Afib on warfarin for anticoagulation.    Procedure: right ankle orif Date of procedure: 03/23/18  CHADS2-VASc score of  5 (CHF, HTN, AGE, DM2, stroke/tia x 2, CAD, AGE, female)  Per office protocol, patient can hold warfarin for 5 days prior to procedure.   Patient will not need bridging with Lovenox (enoxaparin) around procedure.

## 2018-03-16 NOTE — Telephone Encounter (Signed)
   Primary Cardiologist: Lauree Chandler, MD  Chart reviewed as part of pre-operative protocol coverage. Patient was contacted 03/16/2018 in reference to pre-operative risk assessment for pending surgery as outlined below.  Emily Andrews was last seen on 12/06/17 by Dr. Angelena Form.  Since that day, Emily Andrews has done well from cardiology.  No chest pain or SOB and had been able do meet 4 METS with activity until fracture.     Therefore, based on ACC/AHA guidelines, the patient would be at acceptable risk for the planned procedure without further cardiovascular testing.   I will route this recommendation to the requesting party via Epic fax function and remove from pre-op pool.  Please call with questions.  Cecilie Kicks, NP 03/16/2018, 2:39 PM

## 2018-03-16 NOTE — Telephone Encounter (Signed)
Pharm please address coumadin, her surgery in on the 20th of this month.  Thanks.

## 2018-03-16 NOTE — Telephone Encounter (Signed)
Called pt and spoke with her and instructed her that she can hold her Coumadin 5 days without a Lovenox bridge per clearance on 03/14/18 by Pharmacist Georgina Peer. Pt will take her last dose of Coumadin on 03/17/18 then start holding for her ORIF ankle procedure on 03/23/18. Pt verbalized understanding and repeated that she is to take her last dose on 03/17/18. Pt states she will be getting Home Health. Pt will give the Home Health RN our number when she finds out the Agency. Will follow-up with pt after procedure.

## 2018-03-19 ENCOUNTER — Encounter (HOSPITAL_COMMUNITY): Payer: Self-pay | Admitting: *Deleted

## 2018-03-19 ENCOUNTER — Other Ambulatory Visit: Payer: Self-pay

## 2018-03-23 ENCOUNTER — Ambulatory Visit (HOSPITAL_COMMUNITY): Payer: Medicare Other | Admitting: Anesthesiology

## 2018-03-23 ENCOUNTER — Encounter (HOSPITAL_COMMUNITY): Payer: Self-pay | Admitting: Certified Registered Nurse Anesthetist

## 2018-03-23 ENCOUNTER — Other Ambulatory Visit: Payer: Self-pay

## 2018-03-23 ENCOUNTER — Ambulatory Visit (HOSPITAL_COMMUNITY): Payer: Medicare Other

## 2018-03-23 ENCOUNTER — Ambulatory Visit (HOSPITAL_COMMUNITY)
Admission: RE | Admit: 2018-03-23 | Discharge: 2018-03-23 | Disposition: A | Discharge status: Home / Self Care | Payer: Medicare Other | Attending: Orthopedic Surgery | Admitting: Orthopedic Surgery

## 2018-03-23 ENCOUNTER — Ambulatory Visit (HOSPITAL_COMMUNITY)
Admission: RE | Disposition: A | Discharge status: Home / Self Care | Payer: Self-pay | Source: Home / Self Care | Attending: Orthopedic Surgery

## 2018-03-23 DIAGNOSIS — S82851A Displaced trimalleolar fracture of right lower leg, initial encounter for closed fracture: Secondary | ICD-10-CM | POA: Diagnosis not present

## 2018-03-23 DIAGNOSIS — X58XXXA Exposure to other specified factors, initial encounter: Secondary | ICD-10-CM | POA: Diagnosis not present

## 2018-03-23 DIAGNOSIS — I480 Paroxysmal atrial fibrillation: Secondary | ICD-10-CM | POA: Insufficient documentation

## 2018-03-23 DIAGNOSIS — Z79899 Other long term (current) drug therapy: Secondary | ICD-10-CM | POA: Diagnosis not present

## 2018-03-23 DIAGNOSIS — E119 Type 2 diabetes mellitus without complications: Secondary | ICD-10-CM | POA: Insufficient documentation

## 2018-03-23 DIAGNOSIS — E785 Hyperlipidemia, unspecified: Secondary | ICD-10-CM | POA: Insufficient documentation

## 2018-03-23 DIAGNOSIS — I10 Essential (primary) hypertension: Secondary | ICD-10-CM | POA: Insufficient documentation

## 2018-03-23 DIAGNOSIS — Z419 Encounter for procedure for purposes other than remedying health state, unspecified: Secondary | ICD-10-CM

## 2018-03-23 HISTORY — PX: ORIF ANKLE FRACTURE: SHX5408

## 2018-03-23 HISTORY — DX: Cardiac arrhythmia, unspecified: I49.9

## 2018-03-23 LAB — CBC
HEMATOCRIT: 41.9 % (ref 36.0–46.0)
Hemoglobin: 13.6 g/dL (ref 12.0–15.0)
MCH: 31.3 pg (ref 26.0–34.0)
MCHC: 32.5 g/dL (ref 30.0–36.0)
MCV: 96.3 fL (ref 80.0–100.0)
NRBC: 0 % (ref 0.0–0.2)
Platelets: 336 10*3/uL (ref 150–400)
RBC: 4.35 MIL/uL (ref 3.87–5.11)
RDW: 13.4 % (ref 11.5–15.5)
WBC: 8.7 10*3/uL (ref 4.0–10.5)

## 2018-03-23 LAB — BASIC METABOLIC PANEL
Anion gap: 11 (ref 5–15)
BUN: 20 mg/dL (ref 8–23)
CHLORIDE: 100 mmol/L (ref 98–111)
CO2: 26 mmol/L (ref 22–32)
CREATININE: 0.68 mg/dL (ref 0.44–1.00)
Calcium: 9.5 mg/dL (ref 8.9–10.3)
GFR calc Af Amer: >60 mL/min (ref >60)
GFR calc non Af Amer: >60 mL/min (ref >60)
Glucose, Bld: 125 mg/dL — ABNORMAL HIGH (ref 70–99)
Potassium: 3.5 mmol/L (ref 3.5–5.1)
Sodium: 137 mmol/L (ref 135–145)

## 2018-03-23 LAB — PROTIME-INR
INR: 1.01
Prothrombin Time: 13.2 seconds (ref 11.4–15.2)

## 2018-03-23 LAB — HEMOGLOBIN A1C
Hgb A1c MFr Bld: 5.5 % (ref 4.8–5.6)
Mean Plasma Glucose: 111.15 mg/dL

## 2018-03-23 SURGERY — OPEN REDUCTION INTERNAL FIXATION (ORIF) ANKLE FRACTURE
Anesthesia: General | Laterality: Right

## 2018-03-23 MED ORDER — DEXMEDETOMIDINE HCL IN NACL 200 MCG/50ML IV SOLN
INTRAVENOUS | Status: AC
  Filled 2018-03-23: qty 50

## 2018-03-23 MED ORDER — PHENYLEPHRINE HCL 10 MG/ML IJ SOLN
INTRAMUSCULAR | Status: AC
  Filled 2018-03-23: qty 1

## 2018-03-23 MED ORDER — DEXAMETHASONE SODIUM PHOSPHATE 10 MG/ML IJ SOLN
INTRAMUSCULAR | Status: DC | PRN
  Administered 2018-03-23: 4 mg via INTRAVENOUS

## 2018-03-23 MED ORDER — ROPIVACAINE HCL 7.5 MG/ML IJ SOLN
INTRAMUSCULAR | Status: DC | PRN
  Administered 2018-03-23: 25 mL via PERINEURAL

## 2018-03-23 MED ORDER — CEFAZOLIN SODIUM-DEXTROSE 2-4 GM/100ML-% IV SOLN
INTRAVENOUS | Status: AC
  Filled 2018-03-23: qty 100

## 2018-03-23 MED ORDER — PROPOFOL 10 MG/ML IV BOLUS
INTRAVENOUS | Status: AC
  Filled 2018-03-23: qty 40

## 2018-03-23 MED ORDER — ONDANSETRON HCL 4 MG/2ML IJ SOLN
INTRAMUSCULAR | Status: AC
  Filled 2018-03-23: qty 2

## 2018-03-23 MED ORDER — HYDROCODONE-ACETAMINOPHEN 5-325 MG PO TABS: 1.0000 | ORAL_TABLET | ORAL | 0 refills | Status: DC | PRN

## 2018-03-23 MED ORDER — EPHEDRINE SULFATE-NACL 50-0.9 MG/10ML-% IV SOSY
PREFILLED_SYRINGE | INTRAVENOUS | Status: DC | PRN
  Administered 2018-03-23: 10 mg via INTRAVENOUS

## 2018-03-23 MED ORDER — ROCURONIUM BROMIDE 10 MG/ML (PF) SYRINGE
PREFILLED_SYRINGE | INTRAVENOUS | Status: AC
  Filled 2018-03-23: qty 10

## 2018-03-23 MED ORDER — ONDANSETRON HCL 4 MG/2ML IJ SOLN
INTRAMUSCULAR | Status: DC | PRN
  Administered 2018-03-23: 4 mg via INTRAVENOUS

## 2018-03-23 MED ORDER — DEXAMETHASONE SODIUM PHOSPHATE 10 MG/ML IJ SOLN
INTRAMUSCULAR | Status: AC
  Filled 2018-03-23: qty 1

## 2018-03-23 MED ORDER — LIDOCAINE 2% (20 MG/ML) 5 ML SYRINGE
INTRAMUSCULAR | Status: DC | PRN
  Administered 2018-03-23: 60 mg via INTRAVENOUS

## 2018-03-23 MED ORDER — ONDANSETRON 4 MG PO TBDP: 4.0000 mg | ORAL_TABLET | Freq: Three times a day (TID) | ORAL | 0 refills | Status: DC | PRN

## 2018-03-23 MED ORDER — LACTATED RINGERS IV SOLN
INTRAVENOUS | Status: DC
  Administered 2018-03-23 (×2): via INTRAVENOUS

## 2018-03-23 MED ORDER — PROPOFOL 10 MG/ML IV BOLUS
INTRAVENOUS | Status: DC | PRN
  Administered 2018-03-23: 90 mg via INTRAVENOUS

## 2018-03-23 MED ORDER — MEPERIDINE HCL 50 MG/ML IJ SOLN: 6.2500 mg | INTRAMUSCULAR | Status: DC | PRN

## 2018-03-23 MED ORDER — PHENYLEPHRINE 40 MCG/ML (10ML) SYRINGE FOR IV PUSH (FOR BLOOD PRESSURE SUPPORT)
PREFILLED_SYRINGE | INTRAVENOUS | Status: AC
  Filled 2018-03-23: qty 20

## 2018-03-23 MED ORDER — SODIUM CHLORIDE 0.9 % IV SOLN
INTRAVENOUS | Status: DC | PRN
  Administered 2018-03-23: 25 ug/min via INTRAVENOUS

## 2018-03-23 MED ORDER — BUPIVACAINE-EPINEPHRINE (PF) 0.25% -1:200000 IJ SOLN
INTRAMUSCULAR | Status: AC
  Filled 2018-03-23: qty 30

## 2018-03-23 MED ORDER — PHENYLEPHRINE 40 MCG/ML (10ML) SYRINGE FOR IV PUSH (FOR BLOOD PRESSURE SUPPORT)
PREFILLED_SYRINGE | INTRAVENOUS | Status: DC | PRN
  Administered 2018-03-23: 80 ug via INTRAVENOUS

## 2018-03-23 MED ORDER — FENTANYL CITRATE (PF) 100 MCG/2ML IJ SOLN
INTRAMUSCULAR | Status: DC | PRN
  Administered 2018-03-23: 25 ug via INTRAVENOUS
  Administered 2018-03-23 (×2): 50 ug via INTRAVENOUS
  Administered 2018-03-23: 25 ug via INTRAVENOUS
  Administered 2018-03-23 (×2): 50 ug via INTRAVENOUS

## 2018-03-23 MED ORDER — FENTANYL CITRATE (PF) 250 MCG/5ML IJ SOLN
INTRAMUSCULAR | Status: AC
  Filled 2018-03-23: qty 5

## 2018-03-23 MED ORDER — MIDAZOLAM HCL 2 MG/2ML IJ SOLN
1.0000 mg | INTRAMUSCULAR | Status: DC
  Filled 2018-03-23: qty 2

## 2018-03-23 MED ORDER — FENTANYL CITRATE (PF) 100 MCG/2ML IJ SOLN
INTRAMUSCULAR | Status: AC
  Administered 2018-03-23: 25 ug via INTRAVENOUS
  Filled 2018-03-23: qty 2

## 2018-03-23 MED ORDER — FENTANYL CITRATE (PF) 100 MCG/2ML IJ SOLN
50.0000 ug | INTRAMUSCULAR | Status: DC
  Administered 2018-03-23 (×2): 50 ug via INTRAVENOUS
  Filled 2018-03-23: qty 2

## 2018-03-23 MED ORDER — BUPIVACAINE-EPINEPHRINE 0.25% -1:200000 IJ SOLN
INTRAMUSCULAR | Status: DC | PRN
  Administered 2018-03-23: 20 mL

## 2018-03-23 MED ORDER — CLONIDINE HCL (ANALGESIA) 100 MCG/ML EP SOLN
EPIDURAL | Status: DC | PRN
  Administered 2018-03-23: 100 ug

## 2018-03-23 MED ORDER — FENTANYL CITRATE (PF) 100 MCG/2ML IJ SOLN
25.0000 ug | INTRAMUSCULAR | Status: DC | PRN
  Administered 2018-03-23 (×3): 25 ug via INTRAVENOUS

## 2018-03-23 MED ORDER — CHLORHEXIDINE GLUCONATE 4 % EX LIQD: 60.0000 mL | Freq: Once | CUTANEOUS | Status: DC

## 2018-03-23 MED ORDER — LIDOCAINE 2% (20 MG/ML) 5 ML SYRINGE
INTRAMUSCULAR | Status: AC
  Filled 2018-03-23: qty 5

## 2018-03-23 SURGICAL SUPPLY — 54 items
BAG ZIPLOCK 12X15 (MISCELLANEOUS) ×2 IMPLANT
BANDAGE ACE 4X5 VEL STRL LF (GAUZE/BANDAGES/DRESSINGS) ×2 IMPLANT
BANDAGE ACE 6X5 VEL STRL LF (GAUZE/BANDAGES/DRESSINGS) ×2 IMPLANT
BIT DRILL 2.5X110 QC LCP DISP (BIT) ×2 IMPLANT
BIT DRILL LCP QC 2X140 (BIT) ×2 IMPLANT
COVER SURGICAL LIGHT HANDLE (MISCELLANEOUS) ×2 IMPLANT
COVER WAND RF STERILE (DRAPES) IMPLANT
CUFF TOURN SGL QUICK 34 (TOURNIQUET CUFF) ×1
CUFF TRNQT CYL 34X4X40X1 (TOURNIQUET CUFF) ×1 IMPLANT
DRAPE C-ARM 42X120 X-RAY (DRAPES) ×2 IMPLANT
DRAPE C-ARMOR (DRAPES) ×2 IMPLANT
DRAPE LG THREE QUARTER DISP (DRAPES) ×2 IMPLANT
DRAPE U-SHAPE 47X51 STRL (DRAPES) ×2 IMPLANT
DRSG ADAPTIC 3X8 NADH LF (GAUZE/BANDAGES/DRESSINGS) ×2 IMPLANT
DRSG PAD ABDOMINAL 8X10 ST (GAUZE/BANDAGES/DRESSINGS) ×2 IMPLANT
DURAPREP 26ML APPLICATOR (WOUND CARE) ×2 IMPLANT
ELECT REM PT RETURN 15FT ADLT (MISCELLANEOUS) ×2 IMPLANT
GAUZE SPONGE 4X4 12PLY STRL (GAUZE/BANDAGES/DRESSINGS) ×4 IMPLANT
GLOVE BIO SURGEON STRL SZ7.5 (GLOVE) ×4 IMPLANT
GLOVE BIOGEL PI IND STRL 8 (GLOVE) ×1 IMPLANT
GLOVE BIOGEL PI INDICATOR 8 (GLOVE) ×1
GOWN STRL REUS W/TWL LRG LVL3 (GOWN DISPOSABLE) ×2 IMPLANT
GUIDEWARE NON THREAD 1.25X150 (WIRE) ×4
GUIDEWIRE NON THREAD 1.25X150 (WIRE) ×2 IMPLANT
MANIFOLD NEPTUNE II (INSTRUMENTS) ×2 IMPLANT
NDL SAFETY ECLIPSE 18X1.5 (NEEDLE) ×1 IMPLANT
NEEDLE HYPO 18GX1.5 SHARP (NEEDLE) ×1
NEEDLE HYPO 22GX1.5 SAFETY (NEEDLE) ×2 IMPLANT
NS IRRIG 1000ML POUR BTL (IV SOLUTION) ×2 IMPLANT
PACK TOTAL JOINT (CUSTOM PROCEDURE TRAY) ×2 IMPLANT
PAD CAST 4YDX4 CTTN HI CHSV (CAST SUPPLIES) ×2 IMPLANT
PADDING CAST COTTON 4X4 STRL (CAST SUPPLIES) ×2
PADDING CAST COTTON 6X4 STRL (CAST SUPPLIES) ×2 IMPLANT
PLATE 4HOLE DISTAL FIB R 2.7 (Plate) ×2 IMPLANT
PROTECTOR NERVE ULNAR (MISCELLANEOUS) ×2 IMPLANT
SCREW CORT LP ST 3.5X14 (Screw) ×2 IMPLANT
SCREW CORTEX 2.7 SLF-TPNG 16MM (Screw) ×2 IMPLANT
SCREW LOCK VA ST 2.7X12 (Screw) ×2 IMPLANT
SCREW LOCK VA ST 2.7X14 (Screw) ×2 IMPLANT
SCREW LOCKING 2.7X16MM VA (Screw) ×4 IMPLANT
SCREW METAPHYSCAL 18MM (Screw) ×2 IMPLANT
SCREW SELF TAP 12M (Screw) ×2 IMPLANT
SCREW SELF TAP 14MM (Screw) ×2 IMPLANT
SCREW SHORT THREAD 4.0X40 (Screw) ×4 IMPLANT
STRIP CLOSURE SKIN 1/2X4 (GAUZE/BANDAGES/DRESSINGS) ×2 IMPLANT
SUT ETHILON 3 0 PS 1 (SUTURE) ×2 IMPLANT
SUT MNCRL AB 4-0 PS2 18 (SUTURE) ×2 IMPLANT
SUT VIC AB 1 CT1 27 (SUTURE) ×2
SUT VIC AB 1 CT1 27XBRD ANTBC (SUTURE) ×2 IMPLANT
SUT VIC AB 2-0 CT1 27 (SUTURE) ×1
SUT VIC AB 2-0 CT1 TAPERPNT 27 (SUTURE) ×1 IMPLANT
SYR CONTROL 10ML LL (SYRINGE) ×2 IMPLANT
TOWEL OR 17X26 10 PK STRL BLUE (TOWEL DISPOSABLE) ×4 IMPLANT
YANKAUER SUCT BULB TIP NO VENT (SUCTIONS) ×2 IMPLANT

## 2018-03-23 NOTE — Anesthesia Postprocedure Evaluation (Signed)
Anesthesia Post Note  Patient: Emily Andrews  Procedure(s) Performed: OPEN REDUCTION INTERNAL FIXATION (ORIF) ANKLE FRACTURE (Right )     Patient location during evaluation: PACU Anesthesia Type: General Level of consciousness: awake Pain management: pain level controlled Vital Signs Assessment: post-procedure vital signs reviewed and stable Respiratory status: spontaneous breathing Cardiovascular status: stable Postop Assessment: no apparent nausea or vomiting Anesthetic complications: no    Last Vitals:  Vitals:   03/23/18 1630 03/23/18 1645  BP: (!) 131/54 (!) 140/59  Pulse: 75 74  Resp: 18 18  Temp:  36.6 C  SpO2: 95% 100%    Last Pain:  Vitals:   03/23/18 1645  TempSrc:   PainSc: 2    Pain Goal: Patients Stated Pain Goal: 4 (03/23/18 1156)               Huston Foley

## 2018-03-23 NOTE — Brief Op Note (Signed)
03/23/2018  3:13 PM  PATIENT:  Emily Andrews  83 y.o. female  PRE-OPERATIVE DIAGNOSIS:  Right trimalleolar ankle fracture  POST-OPERATIVE DIAGNOSIS:  Right trimalleolar ankle fracture  PROCEDURE:  Procedure(s): OPEN REDUCTION INTERNAL FIXATION (ORIF) ANKLE FRACTURE (Right)  SURGEON:  Surgeon(s) and Role:    * Nicholes Stairs, MD - Primary  PHYSICIAN ASSISTANT:   ASSISTANTS: none   ANESTHESIA:   local, regional and general  EBL:  10 mL   BLOOD ADMINISTERED:none  DRAINS: none   LOCAL MEDICATIONS USED:  MARCAINE     SPECIMEN:  No Specimen  DISPOSITION OF SPECIMEN:  N/A  COUNTS:  YES  TOURNIQUET:   Total Tourniquet Time Documented: Thigh (Right) - 51 minutes Total: Thigh (Right) - 51 minutes   DICTATION: .Note written in EPIC  PLAN OF CARE: Discharge to home after PACU  PATIENT DISPOSITION:  PACU - hemodynamically stable.   Delay start of Pharmacological VTE agent (>24hrs) due to surgical blood loss or risk of bleeding: not applicable

## 2018-03-23 NOTE — H&P (Signed)
[facility] H and P  REQUESTING PHYSICIAN: Nicholes Stairs, MD  PCP:  Orpah Melter, MD  Chief Complaint: Right ankle fracture  HPI: Emily Andrews is a 83 y.o. female who complains of right ankle pain.  She has a known trimalleolar right ankle fracture.  She has been evaluated by myself in the office where we assessed her and placed her in a short leg splint.  She had moderate swelling at that time and presents today for subacute fixation.  She has no new complaints today.  Past Medical History:  Diagnosis Date  . Anxiety   . Arthritis    a. R>L knees  . Diabetes mellitus   . Dysrhythmia   . Environmental allergies   . Hiatal hernia   . Hyperlipidemia    a. statin intolerant - myalgias  . Paroxysmal atrial fibrillation ([place])    a. Dx in 2004.  Off coumadin x several yrs;  b. Normal Event Monitor 03/2011.   Past Surgical History:  Procedure Laterality Date  . ABDOMINAL HYSTERECTOMY    . CHOLECYSTECTOMY    . CYSTECTOMY     lower back  . lipoma tumor removed     right shoulder   Social History   Socioeconomic History  . Marital status: Married    Spouse name: Not on file  . Number of children: Not on file  . Years of education: Not on file  . Highest education level: Not on file  Occupational History  . Not on file  Social Needs  . Financial resource strain: Not on file  . Food insecurity:    Worry: Not on file    Inability: Not on file  . Transportation needs:    Medical: Not on file    Non-medical: Not on file  Tobacco Use  . Smoking status: Never Smoker  . Smokeless tobacco: Never Used  Substance and Sexual Activity  . Alcohol use: No  . Drug use: No  . Sexual activity: Not on file  Lifestyle  . Physical activity:    Days per week: Not on file    Minutes per session: Not on file  . Stress: Not on file  Relationships  . Social connections:    Talks on phone: Not on file    Gets together: Not on file    Attends religious service:  Not on file    Active member of club or organization: Not on file    Attends meetings of clubs or organizations: Not on file    Relationship status: Not on file  Other Topics Concern  . Not on file  Social History Narrative   Lives in [place] with husband.  Active @ home.   Family History  Problem Relation Age of Onset  . Liver disease Mother        cirrhosis - died in 109's  . Heart failure Mother   . Stroke Father        died in 27's  . Coronary artery disease Sister        living  . Coronary artery disease Brother        living   Allergies  Allergen Reactions  . Statins Other (See Comments)    Atrophy of leg muscle according to patient.  . Adhesive [Tape] Itching, Rash and Other (See Comments)    Pt allergic to monitor electrodes  . Celebrex [Celecoxib] Other (See Comments)    unknown  . Clindamycin/Lincomycin Rash  . Penicillins Other (See Comments)  Has patient had a PCN reaction causing immediate rash, facial/tongue/throat swelling, SOB or lightheadedness with hypotension: Y Has patient had a PCN reaction causing severe rash involving mucus membranes or skin necrosis: Y Has patient had a PCN reaction that required hospitalization: N Has patient had a PCN reaction occurring within the last 10 years: N If all of the above answers are "NO", then may proceed with Cephalosporin use.   Marland Kitchen Phenergan [Promethazine Hcl] Other (See Comments)    unknown  . Robaxin [Methocarbamol] Other (See Comments)    unknown   Prior to Admission medications   Medication Sig Start Date End Date Taking? Authorizing Provider  Calcium Carbonate-Vitamin D (CALTRATE 600+D PO) Take 1 tablet by mouth daily.    Yes [provider]  chlorthalidone (HYGROTON) 25 MG tablet Take 1 tablet (25 mg total) by mouth daily. 08/07/14  Yes Larey Dresser, MD  cyanocobalamin 100 MCG tablet Take 100 mcg by mouth daily.    Yes [provider]  ezetimibe (ZETIA) 10 MG tablet Take 10 mg by  mouth every evening.  01/24/18  Yes [provider]  flecainide (TAMBOCOR) 50 MG tablet TAKE 1 & 1/2 (ONE & ONE-HALF) TABLETS BY MOUTH TWICE DAILY Patient taking differently: Take 75 mg by mouth 2 (two) times daily.  01/04/18  Yes Burnell Blanks, MD  losartan (COZAAR) 100 MG tablet Take 100 mg by mouth daily.   Yes [provider]  Magnesium 250 MG TABS Take 250 mg by mouth 2 (two) times daily.  08/12/11  Yes Larey Dresser, MD  metoprolol tartrate (LOPRESSOR) 25 MG tablet Take 25 mg by mouth 2 (two) times daily.  08/12/11  Yes Larey Dresser, MD  Multiple Vitamins-Minerals (CENTRUM PO) Take 1 tablet by mouth daily.     Yes [provider]  Omega-3 Fatty Acids (FISH OIL PO) Take 2 capsules by mouth daily.    Yes [provider]  omeprazole (PRILOSEC) 20 MG capsule Take 1 capsule (20 mg total) by mouth daily. 08/12/11  Yes Larey Dresser, MD  potassium chloride SA (K-DUR,KLOR-CON) 20 MEQ tablet TAKE 1 TABLET BY MOUTH DAILY Patient taking differently: Take 20 mEq by mouth daily.  01/24/18  Yes Burnell Blanks, MD  HYDROcodone-acetaminophen (NORCO/VICODIN) 5-325 MG tablet Take 1 tablet by mouth every 6 (six) hours as needed for moderate pain. Patient not taking: Reported on 03/16/2018 03/09/18   Dalia Heading, PA-C  trolamine salicylate (ASPERCREME) 10 % cream Apply 1 application topically daily as needed for muscle pain.    [provider]  warfarin (COUMADIN) 5 MG tablet TAKE BY MOUTH AS DIRECTED BY ANTICOAGULATION CLINIC Patient not taking: Take 5 mg by mouth daily on Sunday, Monday, Wednesday and Friday. Take 2.5 mg by mouth daily on all other days. 11/28/17   Burnell Blanks, MD   No results found.  Positive ROS: All other systems have been reviewed and were otherwise negative with the exception of those mentioned in the HPI and as above.  Physical Exam: General: Alert, no acute distress Cardiovascular: No pedal  edema Respiratory: No cyanosis, no use of accessory musculature GI: No organomegaly, abdomen is soft and non-tender Skin: No lesions in the area of chief complaint Neurologic: Sensation intact distally Psychiatric: Patient is competent for consent with normal mood and affect Lymphatic: No axillary or cervical lymphadenopathy    Assessment: Closed right trimalleolar ankle fracture  Plan: -Plan for open reduction and internal fixation of the medial malleolus and lateral  malleolus.  We again reviewed the risk, benefits, and indications of this procedure at length.  We discussed the risk of bleeding, infection, damage to surrounding neurovascular structures, nonunion, malunion, painful hardware, hardware failure, development of blood clots, and the risk of anesthesia.  She has provided informed consent to proceed. -We discussed discharge home postoperatively.  We will see how she does from an anesthesia standpoint certainly consider that.    Nicholes Stairs, MD Cell [phone]    03/23/2018 12:38 PM

## 2018-03-23 NOTE — Anesthesia Preprocedure Evaluation (Addendum)
Anesthesia Evaluation  Patient identified by MRN, date of birth, ID band Patient awake    Reviewed: Allergy & Precautions, H&P , NPO status , Patient's Chart, lab work & pertinent test results, reviewed documented beta blocker date and time   Airway Mallampati: II  TM Distance: >3 FB Neck ROM: full    Dental no notable dental hx. (+) Edentulous Upper, Edentulous Lower, Upper Dentures, Lower Dentures   Pulmonary neg pulmonary ROS,    Pulmonary exam normal breath sounds clear to auscultation       Cardiovascular Exercise Tolerance: Good hypertension, Pt. on medications and Pt. on home beta blockers + dysrhythmias Atrial Fibrillation (-) pacemaker Rhythm:regular Rate:Normal  Paroxysmal atrial fibrillation (   Neuro/Psych negative neurological ROS  negative psych ROS   GI/Hepatic Neg liver ROS, hiatal hernia,   Endo/Other  negative endocrine ROSdiabetes  Renal/GU negative Renal ROS  negative genitourinary   Musculoskeletal   Abdominal   Peds  Hematology negative hematology ROS (+)   Anesthesia Other Findings   Reproductive/Obstetrics negative OB ROS                            Anesthesia Physical Anesthesia Plan  ASA: II  Anesthesia Plan: General   Post-op Pain Management: GA combined w/ Regional for post-op pain   Induction:   PONV Risk Score and Plan: 2 and Treatment may vary due to age or medical condition  Airway Management Planned: Oral ETT and LMA  Additional Equipment:   Intra-op Plan:   Post-operative Plan: Extubation in OR  Informed Consent: I have reviewed the patients History and Physical, chart, labs and discussed the procedure including the risks, benefits and alternatives for the proposed anesthesia with the patient or authorized representative who has indicated his/her understanding and acceptance.   Dental Advisory Given  Plan Discussed with: CRNA  Anesthesia  Plan Comments: (Discussed both nerve block for pain relief post-op and GA; including NV, sore throat, dental injury, and pulmonary complications)        Anesthesia Quick Evaluation

## 2018-03-23 NOTE — Anesthesia Procedure Notes (Signed)
Procedure Name: LMA Insertion Date/Time: 03/23/2018 1:54 PM Performed by: Mitzie Na, CRNA Pre-anesthesia Checklist: Patient identified, Emergency Drugs available, Suction available, Patient being monitored and Timeout performed Patient Re-evaluated:Patient Re-evaluated prior to induction Oxygen Delivery Method: Circle system utilized Preoxygenation: Pre-oxygenation with 100% oxygen Induction Type: IV induction LMA: LMA inserted LMA Size: 3.0 Number of attempts: 1 Placement Confirmation: positive ETCO2 and breath sounds checked- equal and bilateral Tube secured with: Tape Dental Injury: Teeth and Oropharynx as per pre-operative assessment

## 2018-03-23 NOTE — Anesthesia Procedure Notes (Signed)
Anesthesia Regional Block: Adductor canal block   Pre-Anesthetic Checklist: ,, timeout performed, Correct Patient, Correct Site, Correct Laterality, Correct Procedure, Correct Position, site marked, Risks and benefits discussed,  Surgical consent,  Pre-op evaluation,  At surgeon's request and post-op pain management  Laterality: Right  Prep: chloraprep       Needles:  Injection technique: Single-shot  Needle Type: Echogenic Stimulator Needle     Needle Length: 5cm  Needle Gauge: 22     Additional Needles:   Procedures:, nerve stimulator,,, ultrasound used (permanent image in chart),,,,  Narrative:  Start time: 03/23/2018 12:58 PM End time: 03/23/2018 1:05 PM Injection made incrementally with aspirations every 5 mL.  Performed by: Personally  Anesthesiologist: Janeece Riggers, MD  Additional Notes: Functioning IV was confirmed and monitors were applied.  A 38mm 22ga Arrow echogenic stimulator needle was used. Sterile prep and drape,hand hygiene and sterile gloves were used. Ultrasound guidance: relevant anatomy identified, needle position confirmed, local anesthetic spread visualized around nerve(s)., vascular puncture avoided.  Image printed for medical record. Negative aspiration and negative test dose prior to incremental administration of local anesthetic. The patient tolerated the procedure well.

## 2018-03-23 NOTE — Op Note (Signed)
   Date of Surgery: 03/23/2018  INDICATIONS: Ms. Emily Andrews is a 83 y.o.-year-old female who sustained a right ankle fracture; she was indicated for open reduction and internal fixation due to the displaced nature of the articular fracture and came to the operating room today for this procedure. The patient did consent to the procedure after discussion of the risks and benefits.  PREOPERATIVE DIAGNOSIS: right Trimalleolar ankle fracture  POSTOPERATIVE DIAGNOSIS: Same.  PROCEDURE:  1.Open treatment of right ankle fracture with internal fixation. Trimalleolar w/o fixation of posterior malleolus CPT 27822.  2. Intraoperative flouroscopy, AP, Lateral and Mortise   SURGEON: Santana Edell P. Stann Mainland, M.D.  ASSIST: None.  ANESTHESIA:  general, regional and local  TOURNIQUET TIME: 51 mm at 300 mmHg  IV FLUIDS AND URINE: See anesthesia.  ESTIMATED BLOOD LOSS: 10 mL.  IMPLANTS: Synthes distal fibula anatomic plate (4 hole) Distal 2.4 mm locking screws Proximal 2.7 cortical screws and 3.5 mm cortical screw  Medially 2 4.0 mm cannulated screws x 40 mm in length  COMPLICATIONS: None.  DESCRIPTION OF PROCEDURE: The patient was brought to the operating room and placed supine on the operating table.  The patient had been signed prior to the procedure and this was documented. The patient had the anesthesia placed by the anesthesiologist.  A nonsterile tourniquet was placed on the upper thigh.  The prep verification and incision time-outs were performed to confirm that this was the correct patient, site, side and location. The patient had an SCD on the opposite lower extremity. The patient did receive antibiotics prior to the incision and was re-dosed during the procedure as needed at indicated intervals.  The patient had the lower extremity prepped and draped in the standard surgical fashion.  The extremity was exsanguinated using an esmarch bandage and the tourniquet was inflated to 300 mm Hg.   Incision was  made over the distal fibula and the fracture was exposed and reduced anatomically with a clamp. A lag screw was placed. I then applied a distal fibula locking plate and secured it proximally with cortical screws and distally with locking screws. Bone quality was poor. I used c-arm to confirm satisfactory reduction and fixation.   I then turned my attention to the medial malleolus. We were able to percutaneously fix this nondisplaced medial malleolus transverse fracture. 2 guidepins were placed for the 4.0 mm cannulated screws and then confirmation of reduction was made with fluoroscopy. I then placed 2  44mm screws which had satisfactory fixation.   The syndesmosis was stressed using live fluoroscopy and found to be stable.    The posterior malleolus fracture was managed in a closed fashion.  The wounds were irrigated, and closed with vicryl with routine closure for the skin. The wounds were injected with local anesthetic. Sterile gauze was applied followed by a posterior splint. She was awakened and returned to the PACU in stable and satisfactory condition. There were no complications.  All counts were correct.  POSTOPERATIVE PLAN: Ms. Emily Andrews will remain nonweightbearing on this leg for approximately 6 weeks; Ms. Emily Andrews will return for suture removal in 2 weeks.  He will be immobilized in a short leg splint and then transitioned to a CAM walker at his first follow up appointment.  Ms. Emily Andrews will receive DVT prophylaxis based on other medications, activity level, and risk ratio of bleeding to thrombosis.  Geralynn Rile, MD [facility] [phone] 3:13 PM

## 2018-03-23 NOTE — Progress Notes (Addendum)
ORTHOPAEDIC H and P  REQUESTING PHYSICIAN: Nicholes Stairs, MD  PCP:  Orpah Melter, MD  Chief Complaint: Right ankle fracture  HPI: Emily Andrews is a 83 y.o. female who complains of right ankle pain.  She has a known trimalleolar right ankle fracture.  She has been evaluated by myself in the office where we assessed her and placed her in a short leg splint.  She had moderate swelling at that time and presents today for subacute fixation.  She has no new complaints today.  Past Medical History:  Diagnosis Date  . Anxiety   . Arthritis    a. R>L knees  . Diabetes mellitus   . Dysrhythmia   . Environmental allergies   . Hiatal hernia   . Hyperlipidemia    a. statin intolerant - myalgias  . Paroxysmal atrial fibrillation ([place])    a. Dx in 2004.  Off coumadin x several yrs;  b. Normal Event Monitor 03/2011.   Past Surgical History:  Procedure Laterality Date  . ABDOMINAL HYSTERECTOMY    . CHOLECYSTECTOMY    . CYSTECTOMY     lower back  . lipoma tumor removed     right shoulder   Social History   Socioeconomic History  . Marital status: Married    Spouse name: Not on file  . Number of children: Not on file  . Years of education: Not on file  . Highest education level: Not on file  Occupational History  . Not on file  Social Needs  . Financial resource strain: Not on file  . Food insecurity:    Worry: Not on file    Inability: Not on file  . Transportation needs:    Medical: Not on file    Non-medical: Not on file  Tobacco Use  . Smoking status: Never Smoker  . Smokeless tobacco: Never Used  Substance and Sexual Activity  . Alcohol use: No  . Drug use: No  . Sexual activity: Not on file  Lifestyle  . Physical activity:    Days per week: Not on file    Minutes per session: Not on file  . Stress: Not on file  Relationships  . Social connections:    Talks on phone: Not on file    Gets together: Not on file    Attends religious service:  Not on file    Active member of club or organization: Not on file    Attends meetings of clubs or organizations: Not on file    Relationship status: Not on file  Other Topics Concern  . Not on file  Social History Narrative   Lives in [place] with husband.  Active @ home.   Family History  Problem Relation Age of Onset  . Liver disease Mother        cirrhosis - died in 65's  . Heart failure Mother   . Stroke Father        died in 11's  . Coronary artery disease Sister        living  . Coronary artery disease Brother        living   Allergies  Allergen Reactions  . Statins Other (See Comments)    Atrophy of leg muscle according to patient.  . Adhesive [Tape] Itching, Rash and Other (See Comments)    Pt allergic to monitor electrodes  . Celebrex [Celecoxib] Other (See Comments)    unknown  . Clindamycin/Lincomycin Rash  . Penicillins Other (See Comments)  Has patient had a PCN reaction causing immediate rash, facial/tongue/throat swelling, SOB or lightheadedness with hypotension: Y Has patient had a PCN reaction causing severe rash involving mucus membranes or skin necrosis: Y Has patient had a PCN reaction that required hospitalization: N Has patient had a PCN reaction occurring within the last 10 years: N If all of the above answers are "NO", then may proceed with Cephalosporin use.   Marland Kitchen Phenergan [Promethazine Hcl] Other (See Comments)    unknown  . Robaxin [Methocarbamol] Other (See Comments)    unknown   Prior to Admission medications   Medication Sig Start Date End Date Taking? Authorizing Provider  Calcium Carbonate-Vitamin D (CALTRATE 600+D PO) Take 1 tablet by mouth daily.    Yes [provider]  chlorthalidone (HYGROTON) 25 MG tablet Take 1 tablet (25 mg total) by mouth daily. 08/07/14  Yes Larey Dresser, MD  cyanocobalamin 100 MCG tablet Take 100 mcg by mouth daily.    Yes [provider]  ezetimibe (ZETIA) 10 MG tablet Take 10 mg by  mouth every evening.  01/24/18  Yes [provider]  flecainide (TAMBOCOR) 50 MG tablet TAKE 1 & 1/2 (ONE & ONE-HALF) TABLETS BY MOUTH TWICE DAILY Patient taking differently: Take 75 mg by mouth 2 (two) times daily.  01/04/18  Yes Burnell Blanks, MD  losartan (COZAAR) 100 MG tablet Take 100 mg by mouth daily.   Yes [provider]  Magnesium 250 MG TABS Take 250 mg by mouth 2 (two) times daily.  08/12/11  Yes Larey Dresser, MD  metoprolol tartrate (LOPRESSOR) 25 MG tablet Take 25 mg by mouth 2 (two) times daily.  08/12/11  Yes Larey Dresser, MD  Multiple Vitamins-Minerals (CENTRUM PO) Take 1 tablet by mouth daily.     Yes [provider]  Omega-3 Fatty Acids (FISH OIL PO) Take 2 capsules by mouth daily.    Yes [provider]  omeprazole (PRILOSEC) 20 MG capsule Take 1 capsule (20 mg total) by mouth daily. 08/12/11  Yes Larey Dresser, MD  potassium chloride SA (K-DUR,KLOR-CON) 20 MEQ tablet TAKE 1 TABLET BY MOUTH DAILY Patient taking differently: Take 20 mEq by mouth daily.  01/24/18  Yes Burnell Blanks, MD  HYDROcodone-acetaminophen (NORCO/VICODIN) 5-325 MG tablet Take 1 tablet by mouth every 6 (six) hours as needed for moderate pain. Patient not taking: Reported on 03/16/2018 03/09/18   Dalia Heading, PA-C  trolamine salicylate (ASPERCREME) 10 % cream Apply 1 application topically daily as needed for muscle pain.    [provider]  warfarin (COUMADIN) 5 MG tablet TAKE BY MOUTH AS DIRECTED BY ANTICOAGULATION CLINIC Patient not taking: Take 5 mg by mouth daily on Sunday, Monday, Wednesday and Friday. Take 2.5 mg by mouth daily on all other days. 11/28/17   Burnell Blanks, MD   No results found.  Positive ROS: All other systems have been reviewed and were otherwise negative with the exception of those mentioned in the HPI and as above.  Physical Exam: General: Alert, no acute distress Cardiovascular: No pedal  edema Respiratory: No cyanosis, no use of accessory musculature GI: No organomegaly, abdomen is soft and non-tender Skin: No lesions in the area of chief complaint Neurologic: Sensation intact distally Psychiatric: Patient is competent for consent with normal mood and affect Lymphatic: No axillary or cervical lymphadenopathy    Assessment: Closed right trimalleolar ankle fracture  Plan: -Plan for open reduction and internal fixation of the medial malleolus and lateral  malleolus.  We again reviewed the risk, benefits, and indications of this procedure at length.  We discussed the risk of bleeding, infection, damage to surrounding neurovascular structures, nonunion, malunion, painful hardware, hardware failure, development of blood clots, and the risk of anesthesia.  She has provided informed consent to proceed. -We discussed discharge home postoperatively.  We will see how she does from an anesthesia standpoint certainly consider that.    Nicholes Stairs, MD Cell [phone]    03/23/2018 12:38 PM

## 2018-03-23 NOTE — Discharge Instructions (Signed)
-   No weight bearing to the right lower extremity - maintain "toes above nose" when lying and keep extremity elevated when able throughout the day - for mild to moderate pain use tylenol and advil, and for moderate to severe pain use Norco as directed - for nausea/vomiting use Zofran as directed - return to see Dr. Stann Mainland in 2 weeks - do not get your splint wet - Resume your Warfarin as before surgery for DVT ppx

## 2018-03-23 NOTE — Transfer of Care (Signed)
Immediate Anesthesia Transfer of Care Note  Patient: Emily Andrews  Procedure(s) Performed: OPEN REDUCTION INTERNAL FIXATION (ORIF) ANKLE FRACTURE (Right )  Patient Location: PACU  Anesthesia Type:General  Level of Consciousness: awake, alert , oriented and patient cooperative  Airway & Oxygen Therapy: Patient Spontanous Breathing and Patient connected to face mask oxygen  Post-op Assessment: Report given to RN, Post -op Vital signs reviewed and stable and Patient moving all extremities  Post vital signs: Reviewed and stable  Last Vitals:  Vitals Value Taken Time  BP 133/60 03/23/2018  3:23 PM  Temp    Pulse 75 03/23/2018  3:25 PM  Resp 26 03/23/2018  3:25 PM  SpO2 97 % 03/23/2018  3:25 PM  Vitals shown include unvalidated device data.  Last Pain:  Vitals:   03/23/18 1305  TempSrc:   PainSc: 0-No pain      Patients Stated Pain Goal: 4 (56/43/32 9518)  Complications: No apparent anesthesia complications

## 2018-03-23 NOTE — Progress Notes (Signed)
Assisted Dr. Oddono with right, ultrasound guided, popliteal block. Side rails up, monitors on throughout procedure. See vital signs in flow sheet. Tolerated Procedure well. 

## 2018-03-26 ENCOUNTER — Telehealth: Payer: Self-pay

## 2018-03-26 NOTE — Telephone Encounter (Signed)
Pt was discharged from hospital 03/23/18 s/p ankle surgery.  Pt resumed Coumadin on 03/24/18 at previous dosage regimen 1 tablet daily except 1/2 tablet on Tu, Th, and Sat.  Advised pt to take an extra 1/2 tablet today and tomorrow, then resume same dosage.  Pt is not on abx at present only new medication is PRN Hydrocodone. Made f/u appt in the office for 04/03/18 prior to her orthopedic appt.  Pt states she is supposed to have [facility] for PT/OT but they have not contacted her to set up yet.  Advised pt TCB if they contact her, let us know which company is providing [facility] and we can try to have her INR checked at home by [facility].  Pt verbalized understanding.

## 2018-03-26 NOTE — Telephone Encounter (Signed)
Had surgery wants to know what to do with the coumadin

## 2018-04-03 ENCOUNTER — Encounter (HOSPITAL_COMMUNITY): Payer: Self-pay | Admitting: Orthopedic Surgery

## 2018-04-03 ENCOUNTER — Ambulatory Visit: Payer: Medicare Other | Admitting: *Deleted

## 2018-04-03 DIAGNOSIS — I4891 Unspecified atrial fibrillation: Secondary | ICD-10-CM

## 2018-04-03 DIAGNOSIS — Z7901 Long term (current) use of anticoagulants: Secondary | ICD-10-CM | POA: Diagnosis not present

## 2018-04-03 DIAGNOSIS — Z5181 Encounter for therapeutic drug level monitoring: Secondary | ICD-10-CM

## 2018-04-03 LAB — POCT INR: INR: 1.9 — AB (ref 2.0–3.0)

## 2018-04-03 NOTE — Patient Instructions (Signed)
Description   Today take 1 tablet, then Continue taking 1 tablet daily except 1/2 tablet each Tuesday, Thursday, and Saturday. Continue eating leafy green vegetable intake of 3 servings each week.  Repeat INR in 2 weeks. Call if scheduled for any surgery or any new medications [phone] [facility].

## 2018-04-17 ENCOUNTER — Ambulatory Visit: Payer: Medicare Other

## 2018-04-17 DIAGNOSIS — Z7901 Long term (current) use of anticoagulants: Secondary | ICD-10-CM | POA: Diagnosis not present

## 2018-04-17 DIAGNOSIS — I4891 Unspecified atrial fibrillation: Secondary | ICD-10-CM | POA: Diagnosis not present

## 2018-04-17 DIAGNOSIS — Z5181 Encounter for therapeutic drug level monitoring: Secondary | ICD-10-CM

## 2018-04-17 LAB — POCT INR: INR: 2.2 (ref 2.0–3.0)

## 2018-04-17 NOTE — Patient Instructions (Signed)
Please continue taking 1 tablet daily except 1/2 tablet each Tuesday, Thursday, and Saturday. Continue eating leafy green vegetable intake of 3 servings each week.  Repeat INR in 3 weeks. Call if scheduled for any surgery or any new medications [phone] [facility].

## 2018-05-08 ENCOUNTER — Ambulatory Visit: Payer: Medicare Other | Admitting: *Deleted

## 2018-05-08 DIAGNOSIS — Z7901 Long term (current) use of anticoagulants: Secondary | ICD-10-CM

## 2018-05-08 DIAGNOSIS — I4891 Unspecified atrial fibrillation: Secondary | ICD-10-CM

## 2018-05-08 DIAGNOSIS — Z5181 Encounter for therapeutic drug level monitoring: Secondary | ICD-10-CM | POA: Diagnosis not present

## 2018-05-08 LAB — POCT INR: INR: 3.1 — AB (ref 2.0–3.0)

## 2018-05-08 NOTE — Patient Instructions (Signed)
Description   Tomorrow take 1/2 then continue taking 1 tablet daily except 1/2 tablet each Tuesday, Thursday, and Saturday. Continue eating leafy green vegetable intake of 3 servings each week.   Repeat INR in 4 weeks. Call if scheduled for any surgery or any new medications [phone] [facility].

## 2018-06-05 ENCOUNTER — Ambulatory Visit: Payer: Medicare Other | Admitting: Pharmacist

## 2018-06-05 DIAGNOSIS — Z7901 Long term (current) use of anticoagulants: Secondary | ICD-10-CM | POA: Diagnosis not present

## 2018-06-05 DIAGNOSIS — I4891 Unspecified atrial fibrillation: Secondary | ICD-10-CM

## 2018-06-05 DIAGNOSIS — Z5181 Encounter for therapeutic drug level monitoring: Secondary | ICD-10-CM

## 2018-06-05 LAB — POCT INR: INR: 3.2 — AB (ref 2.0–3.0)

## 2018-06-05 NOTE — Patient Instructions (Signed)
Description   Skip your Coumadin today, then continue taking 1 tablet daily except 1/2 tablet each Tuesday, Thursday, and Saturday. Continue eating leafy green vegetable intake of 3 servings each week.   Repeat INR in 3-4 weeks. Call if scheduled for any surgery or any new medications [phone] [facility].

## 2018-06-26 ENCOUNTER — Telehealth: Payer: Self-pay

## 2018-06-26 NOTE — Telephone Encounter (Signed)

## 2018-06-28 ENCOUNTER — Ambulatory Visit (INDEPENDENT_AMBULATORY_CARE_PROVIDER_SITE_OTHER): Payer: Medicare Other | Admitting: Pharmacist

## 2018-06-28 ENCOUNTER — Other Ambulatory Visit: Payer: Self-pay

## 2018-06-28 DIAGNOSIS — Z5181 Encounter for therapeutic drug level monitoring: Secondary | ICD-10-CM | POA: Diagnosis not present

## 2018-06-28 DIAGNOSIS — I4891 Unspecified atrial fibrillation: Secondary | ICD-10-CM

## 2018-06-28 DIAGNOSIS — Z7901 Long term (current) use of anticoagulants: Secondary | ICD-10-CM

## 2018-06-28 LAB — POCT INR: INR: 2.7 (ref 2.0–3.0)

## 2018-07-02 ENCOUNTER — Telehealth: Payer: Self-pay | Admitting: Cardiovascular Disease

## 2018-07-02 NOTE — Telephone Encounter (Signed)
New Message:    Patient calling to discuss some medication. Please call patient back.

## 2018-07-02 NOTE — Telephone Encounter (Signed)
Spoke with patient - she is concerned about hitting donut hole, as she is also on flecainide.  She will contact her insurance about being on both and whether she would reach the donut hole.  She will let us know if she decides to switch to Eliquis.

## 2018-07-02 NOTE — Telephone Encounter (Signed)
Pt called to report that at her last Coumadin clinic appt the plan was to start Eliquis at her next appt 07/26/18.Marland Kitchen pt has looked up the cost and is having anxiety about it.. I advised her that there is assistance and can discuss it further at her next visit and she agreed.... will forward to the anticoagulation clinic.

## 2018-07-24 ENCOUNTER — Telehealth: Payer: Self-pay

## 2018-07-24 NOTE — Telephone Encounter (Signed)

## 2018-07-26 ENCOUNTER — Other Ambulatory Visit: Payer: Self-pay

## 2018-07-26 ENCOUNTER — Ambulatory Visit (INDEPENDENT_AMBULATORY_CARE_PROVIDER_SITE_OTHER): Payer: Medicare Other | Admitting: Pharmacist Clinician (PhC)/ Clinical Pharmacy Specialist

## 2018-07-26 DIAGNOSIS — Z7901 Long term (current) use of anticoagulants: Secondary | ICD-10-CM

## 2018-07-26 DIAGNOSIS — Z5181 Encounter for therapeutic drug level monitoring: Secondary | ICD-10-CM | POA: Diagnosis not present

## 2018-07-26 DIAGNOSIS — I4891 Unspecified atrial fibrillation: Secondary | ICD-10-CM | POA: Diagnosis not present

## 2018-07-26 LAB — POCT INR: INR: 3.3 — AB (ref 2.0–3.0)

## 2018-08-15 ENCOUNTER — Other Ambulatory Visit: Payer: Self-pay | Admitting: Cardiovascular Disease

## 2018-08-15 ENCOUNTER — Telehealth: Payer: Self-pay

## 2018-08-15 NOTE — Telephone Encounter (Signed)

## 2018-08-16 ENCOUNTER — Other Ambulatory Visit: Payer: Self-pay

## 2018-08-16 ENCOUNTER — Ambulatory Visit (INDEPENDENT_AMBULATORY_CARE_PROVIDER_SITE_OTHER): Payer: Medicare Other | Admitting: Pharmacist

## 2018-08-16 DIAGNOSIS — I4891 Unspecified atrial fibrillation: Secondary | ICD-10-CM | POA: Diagnosis not present

## 2018-08-16 DIAGNOSIS — Z7901 Long term (current) use of anticoagulants: Secondary | ICD-10-CM

## 2018-08-16 DIAGNOSIS — Z5181 Encounter for therapeutic drug level monitoring: Secondary | ICD-10-CM | POA: Diagnosis not present

## 2018-08-16 LAB — POCT INR: INR: 2.5 (ref 2.0–3.0)

## 2018-09-01 ENCOUNTER — Other Ambulatory Visit: Payer: Self-pay | Admitting: Cardiovascular Disease

## 2018-09-13 ENCOUNTER — Telehealth: Payer: Self-pay

## 2018-09-13 NOTE — Telephone Encounter (Signed)

## 2018-09-20 ENCOUNTER — Other Ambulatory Visit: Payer: Self-pay

## 2018-09-20 ENCOUNTER — Ambulatory Visit: Payer: Medicare Other

## 2018-09-20 DIAGNOSIS — Z7901 Long term (current) use of anticoagulants: Secondary | ICD-10-CM | POA: Diagnosis not present

## 2018-09-20 DIAGNOSIS — Z5181 Encounter for therapeutic drug level monitoring: Secondary | ICD-10-CM | POA: Diagnosis not present

## 2018-09-20 DIAGNOSIS — I4891 Unspecified atrial fibrillation: Secondary | ICD-10-CM

## 2018-09-20 LAB — POCT INR: INR: 2.4 (ref 2.0–3.0)

## 2018-09-20 NOTE — Patient Instructions (Signed)
Description   Continue on same dosage 1 tablet daily except 1/2 tablet each Tuesdays, Thursdays, and Saturdays. Continue eating leafy green vegetable intake of 3 servings each week. Repeat INR in 6 weeks. Call if scheduled for any surgery or any new medications [phone] [facility].

## 2018-10-29 ENCOUNTER — Other Ambulatory Visit: Payer: Self-pay | Admitting: Radiology

## 2018-10-30 ENCOUNTER — Other Ambulatory Visit: Payer: Self-pay | Admitting: Cardiovascular Disease

## 2018-10-30 ENCOUNTER — Telehealth: Payer: Self-pay | Admitting: *Deleted

## 2018-10-30 NOTE — Telephone Encounter (Signed)

## 2018-11-01 ENCOUNTER — Ambulatory Visit (INDEPENDENT_AMBULATORY_CARE_PROVIDER_SITE_OTHER): Payer: Medicare Other | Admitting: *Deleted

## 2018-11-01 ENCOUNTER — Other Ambulatory Visit: Payer: Self-pay

## 2018-11-01 DIAGNOSIS — I4891 Unspecified atrial fibrillation: Secondary | ICD-10-CM | POA: Diagnosis not present

## 2018-11-01 DIAGNOSIS — Z7901 Long term (current) use of anticoagulants: Secondary | ICD-10-CM

## 2018-11-01 DIAGNOSIS — Z5181 Encounter for therapeutic drug level monitoring: Secondary | ICD-10-CM

## 2018-11-01 LAB — POCT INR: INR: 2.6 (ref 2.0–3.0)

## 2018-11-01 NOTE — Patient Instructions (Signed)
Description   Continue on same dosage 1 tablet daily except 1/2 tablet each Tuesdays, Thursdays, and Saturdays. Continue eating leafy green vegetable intake of 3 servings each week. Repeat INR in 6 weeks. Call if scheduled for any surgery or any new medications [phone] [facility].

## 2018-11-15 ENCOUNTER — Telehealth: Payer: Self-pay | Admitting: *Deleted

## 2018-11-15 NOTE — Telephone Encounter (Signed)
   Primary Cardiologist:Christopher Angelena Form, MD  Chart reviewed as part of pre-operative protocol coverage. Because of Emily Andrews's past medical history and time since last visit, he/she will require a follow-up visit which is scheduled with Dr. Angelena Form on 12/13/2018 in order to better assess preoperative cardiovascular risk.  Patient with diagnosis of afib on warfarin for anticoagulation.    Procedure: BREAST LUMPECTOMY Date of procedure: TBD  CHADS2-VASc score of  5  (HTN, AGE, DM2, AGE, female)  Per office protocol, patient can hold warfarin for 5 days prior to procedure.    Patient will NOT need bridging with Lovenox (enoxaparin) around procedure  Pre-op covering staff: - Please contact requesting surgeon's office via preferred method (i.e, phone, fax) to inform them of need for appointment prior to surgery.   Kathyrn Drown, NP  11/15/2018, 3:52 PM

## 2018-11-15 NOTE — Telephone Encounter (Signed)
   [facility] Pre-operative Risk Assessment    Request for surgical clearance:  1. What type of surgery is being performed? BREAST LUMPECTOMY   2. When is this surgery scheduled? TBD   3. What type of clearance is required (medical clearance vs. Pharmacy clearance to hold med vs. Both)? BOTH   4. Are there any medications that need to be held prior to surgery and how long? WARFARIN; POSSIBLE LOVENOX BRIDGING    5. Practice name and name of physician performing surgery? [facility]; DR. Shanon Brow Andrews   6. What is your office phone number [phone]    7.   What is your office fax number [phone]  8.   Anesthesia type (None, local, MAC, general) ? GENERAL   Emily Andrews 11/15/2018, 2:43 PM  _________________________________________________________________   (provider comments below)

## 2018-11-15 NOTE — Telephone Encounter (Signed)
Patient with diagnosis of afib on warfarin for anticoagulation.    Procedure: BREAST LUMPECTOMY  Date of procedure: TBD  CHADS2-VASc score of  5  (HTN, AGE, DM2, AGE, female)  Per office protocol, patient can hold warfarin for 5 days prior to procedure.    Patient will NOT need bridging with Lovenox (enoxaparin) around procedure.

## 2018-11-20 ENCOUNTER — Telehealth: Payer: Self-pay | Admitting: Hematology

## 2018-11-20 NOTE — Telephone Encounter (Signed)
Received a new patient referral from Dr. Lucia Gaskins at [place] for breast cancer. Ms. Emily Andrews has been cld and scheduled to see Dr. Burr Medico on 8/28 at 230pm. She's been made aware to arrive 15 minutes early.

## 2018-11-22 ENCOUNTER — Telehealth: Payer: Self-pay | Admitting: *Deleted

## 2018-11-22 NOTE — Progress Notes (Signed)
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  Receptor Status: ER(100%), PR (100%), Her2-neu (NEG), Ki-(10%)  Did patient present with symptoms or was this found on screening mammography?: It was found on a screening mammogram.   Past/Anticipated interventions by surgeon, if any: Dr. Lucia Gaskins initial consult 11/15/18  Past/Anticipated interventions by medical oncology, if any:  Scheduled with Dr. Burr Medico on 11/30/18  Lymphedema issues, if any: N/A   Pain issues, if any:  She denies.   SAFETY ISSUES:  Prior radiation? No  Pacemaker/ICD? No  Possible current pregnancy? No  Is the patient on methotrexate? No  Current Complaints / other details:      Emily Andrews, Stephani Police, RN 11/22/2018,12:50 PM

## 2018-11-22 NOTE — Telephone Encounter (Signed)
Spoke to daughter Lattie Haw regarding pt appt with Dr. Burr Medico on 8.28.20 at 2:30. Gave contact information and navigation resources. Pt daughter relate mother walks with a walker and will need to be placed in a wheelchair and taken back to Dr. Lewayne Bunting exam rooms as the pt will not be able to walk the distance. Note placed in appt notes for wheelchair access.

## 2018-11-26 ENCOUNTER — Ambulatory Visit
Admission: RE | Admit: 2018-11-26 | Discharge: 2018-11-26 | Disposition: A | Discharge status: Home / Self Care | Payer: Medicare Other | Source: Ambulatory Visit | Attending: Radiation Oncology | Admitting: Radiation Oncology

## 2018-11-26 ENCOUNTER — Encounter: Payer: Self-pay | Admitting: Radiation Oncology

## 2018-11-26 ENCOUNTER — Other Ambulatory Visit: Payer: Self-pay

## 2018-11-26 DIAGNOSIS — C50412 Malignant neoplasm of upper-outer quadrant of left female breast: Secondary | ICD-10-CM

## 2018-11-26 DIAGNOSIS — Z170 Estrogen receptor positive status [ER+]: Secondary | ICD-10-CM

## 2018-11-26 NOTE — Progress Notes (Signed)
Radiation Oncology         [phone] ________________________________  Initial outpatient Consultation by phone due to pandemic precautions and patient's inability to access WebEx  Name: Emily Andrews MRN: [id number]  Date: 11/26/2018  DOB: [birth year]  DB:ZMCEYE, Annie Main, MD  Alphonsa Overall, MD   REFERRING PHYSICIAN: Alphonsa Overall, MD  DIAGNOSIS:    ICD-10-CM   1. Carcinoma of upper-outer quadrant of left breast in female, estrogen receptor positive ([place])  C50.412    Z17.0      Cancer Staging Carcinoma of upper-outer quadrant of left breast in female, estrogen receptor positive ([place]) Staging form: Breast, AJCC 8th Edition - Clinical stage from 11/26/2018: Stage IA (cT1b, cN0, cM0, G1, ER+, PR+, HER2-) - Signed by Eppie Gibson, MD on 11/27/2018   CHIEF COMPLAINT:  left breast cancer  HISTORY OF PRESENT ILLNESS::Emily Andrews is a 84 y.o. female who presented with breast abnormality on the following imaging: screening mammogram.  Symptoms, if any, at that time, were: none.   Ultrasound of breast revealed a 85m mass, UOQ, left breast. 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 The carcinoma appears grade 1 and ER+, PR+, Her2neg  She denies pain.  She denies prior radiation therapy.  She anticipates breast conserving surgery.  She has a appointment with medical oncology in 4 days   PREVIOUS RADIATION THERAPY: No  PAST MEDICAL HISTORY:  has a past medical history of Anxiety, Arthritis, Diabetes mellitus, Dysrhythmia, Environmental allergies, Hiatal hernia, Hyperlipidemia, and [place]ox[place]mal atrial [place]    PAST SURGICAL HISTORY: Past Surgical History:  Procedure Laterality Date   ABDOMINAL HYSTERECTOMY     CHOLECYSTECTOMY     CYSTECTOMY     lower back   lipoma tumor removed     right shoulder   ORIF ANKLE FRACTURE Right 03/23/2018   Procedure: OPEN  REDUCTION INTERNAL FIXATION (ORIF) ANKLE FRACTURE;  Surgeon: RNicholes Stairs MD;  Location: [facility];  Service: Orthopedics;  Laterality: Right;    FAMILY HISTORY: family history includes Coronary artery disease in her brother and sister; Heart failure in her mother; Liver disease in her mother; Stroke in her father.  SOCIAL HISTORY:  reports that she has never smoked. She has never used smokeless tobacco. She reports that she does not drink alcohol or use drugs.  ALLERGIES: Statins, Oxycodone-acetaminophen, Adhesive [tape], Celebrex [celecoxib], Clindamycin/lincomycin, Penicillins, Phenergan [promethazine hcl], and Robaxin [methocarbamol]  MEDICATIONS:  Current Outpatient Medications  Medication Sig Dispense Refill   Calcium Carbonate-Vitamin D (CALTRATE 600+D PO) Take 1 tablet by mouth daily.      chlorthalidone (HYGROTON) 25 MG tablet Take 1 tablet (25 mg total) by mouth daily. 90 tablet 3   cyanocobalamin 100 MCG tablet Take 100 mcg by mouth daily.      ezetimibe (ZETIA) 10 MG tablet Take 10 mg by mouth every evening.   1   flecainide (TAMBOCOR) 50 MG tablet TAKE 1 & 1/2 (ONE & ONE-HALF) TABLETS BY MOUTH TWICE DAILY 225 tablet 0   losartan (COZAAR) 100 MG tablet Take 100 mg by mouth daily.     Magnesium 250 MG TABS Take 250 mg by mouth 2 (two) times daily.   0   metoprolol tartrate (LOPRESSOR) 25 MG tablet Take 25 mg by mouth 2 (two) times daily.      Multiple Vitamins-Minerals (CENTRUM PO) Take 1 tablet by mouth daily.  Omega-3 Fatty Acids (FISH OIL PO) Take 2 capsules by mouth daily.      omeprazole (PRILOSEC) 20 MG capsule Take 1 capsule (20 mg total) by mouth daily.     potassium chloride SA (K-DUR,KLOR-CON) 20 MEQ tablet TAKE 1 TABLET BY MOUTH DAILY (Patient taking differently: Take 20 mEq by mouth daily. ) 90 tablet 3   trolamine salicylate (ASPERCREME) 10 % cream Apply 1 application topically daily as needed for muscle pain.     warfarin (COUMADIN) 5 MG  tablet TAKE BY MOUTH AS DIRECTED BY ANTICOAGULATION CLINIC (Patient taking differently: 2.5 mg Tuesday, Thursday, Saturday) 100 tablet 0   fluticasone (FLONASE) 50 MCG/ACT nasal spray USE 2 SPRAY(S) IN EACH NOSTRIL ONCE DAILY     No current facility-administered medications for this encounter.     REVIEW OF SYSTEMS: As above.   PHYSICAL EXAM:  vitals were not taken for this visit.   General: Alert and oriented, in no acute distress    LABORATORY DATA:  Lab Results  Component Value Date   WBC 8.7 03/23/2018   HGB 13.6 03/23/2018   HCT 41.9 03/23/2018   MCV 96.3 03/23/2018   PLT 336 03/23/2018   CMP     Component Value Date/Time   NA 137 03/23/2018 1130   K 3.5 03/23/2018 1130   CL 100 03/23/2018 1130   CO2 26 03/23/2018 1130   GLUCOSE 125 (H) 03/23/2018 1130   BUN 20 03/23/2018 1130   CREATININE 0.68 03/23/2018 1130   CREATININE 0.91 12/04/2015 1445   CALCIUM 9.5 03/23/2018 1130   PROT 6.8 12/14/2015 1258   ALBUMIN 4.0 12/14/2015 1258   AST 29 12/14/2015 1258   ALT 19 12/14/2015 1258   ALKPHOS 54 12/14/2015 1258   BILITOT 1.4 (H) 12/14/2015 1258   GFRNONAA >60 03/23/2018 1130   GFRAA >60 03/23/2018 1130         RADIOGRAPHY: As above    IMPRESSION/PLAN:  For the patient's early stage favorable risk breast cancer, we had a thorough discussion about her options for adjuvant therapy. One option would be antiestrogen therapy as discussed with medical oncology. She would take a pill for approximately 5 years. The alternative option would be radiotherapy to the breast. The most aggressive option would be to pursue both modalities (but I told her I thought this would be more therapy than necessary).   Of note, I discussed the data from the [employer] al trial in the [place] [employer]. She understands that tamoxifen compared to radiation plus tamoxifen demonstrated no survival benefit among the women in this study. The women were 71 years or older with stage I  estrogen receptor positive breast cancer.  Based on the results from the study, her overall life expectancy should not be affected by adding radiotherapy to antiestrogen medication. She understands that the main benefit of radiotherapy would be a very small but measurable local control benefit (risk of local recurrence to be lowered from ~10% --> ~2%).   We discussed the risks benefits and side effects of radiotherapy. She understands that the side effects would likely include some skin irritation and fatigue during the weeks of radiation. There is a risk of late effects which include but are not necessarily limited to cosmetic changes and rare heart or lung toxicity. I would anticipate she would receive no more than 3 to 4 weeks of radiotherapy.    That being said, because her cancer has such an excellent prognosis I think that antiestrogen therapy  alone should yield an excellent result/prognosis after surgery.  she seems interested in this approach.  I think it would be appropriate to reconsider radiotherapy if there is something unexpected in her pathology (such as positive margins that cannot be cleared).  Otherwise she will follow-up with medical oncology and talk about antiestrogens and tentatively proceed with those alone after surgery.  She is pleased with this plan.  This encounter was provided by telemedicine platform telephone as she could not access WebEx.  The patient has given verbal consent for this type of encounter and has been advised to only accept a meeting of this type in a secure network environment. The time spent during this encounter was 20 minutes. The attendants for this meeting include Eppie Gibson  and Jacqualine Mau.  During the encounter, Eppie Gibson was located at [facility] Radiation Oncology Department.  Emily Andrews was located at home.  __________________________________________   Eppie Gibson, MD

## 2018-11-27 ENCOUNTER — Encounter: Payer: Self-pay | Admitting: Radiation Oncology

## 2018-11-27 DIAGNOSIS — C50412 Malignant neoplasm of upper-outer quadrant of left female breast: Secondary | ICD-10-CM | POA: Insufficient documentation

## 2018-11-27 DIAGNOSIS — Z170 Estrogen receptor positive status [ER+]: Secondary | ICD-10-CM | POA: Insufficient documentation

## 2018-11-29 NOTE — Progress Notes (Signed)
Emily Andrews   [phone] Fax:([phone]) [place] Note   Patient Care Team: Orpah Melter, MD as PCP - General (Family Medicine) Burnell Blanks, MD as PCP - Cardiology (Cardiology) Romeo Apple, MD (Inactive) as Referring Physician (Cardiology)  Date of Service:  11/30/2018   CHIEF COMPLAINTS/PURPOSE OF CONSULTATION:  Newly diagnosed Carcinoma of upper-outer quadrant of left breast   REFERRING PHYSICIAN:  Dr. Lucia Gaskins  Oncology History  Carcinoma of upper-outer quadrant of left breast in female, estrogen receptor positive ([place])  10/08/2018 Mammogram   Mammogram 10/08/18 0.5cm x 0.5cm x 0.6 cm taller than wide irregular mass in the left breast at 1:00 position 10cm from nipple highly suggestive of malignancy.      10/29/2018 Initial Biopsy   Diagnosis 10/29/18 Breast, left, needle core biopsy, 1 o'clock, 10cmfn - INVASIVE DUCTAL CARCINOMA, SEE COMMENT. - DUCTAL CARCINOMA IN SITU.   10/29/2018 Receptors her2   Results: IMMUNOHISTOCHEMICAL AND MORPHOMETRIC ANALYSIS PERFORMED MANUALLY The tumor cells are NEGATIVE for Her2 (1+). Estrogen Receptor: 100%, POSITIVE, STRONG STAINING INTENSITY Progesterone Receptor: 100%, POSITIVE, STRONG STAINING INTENSITY Proliferation Marker Ki67: 10%   11/26/2018 Cancer Staging   Staging form: Breast, AJCC 8th Edition - Clinical stage from 11/26/2018: Stage IA (cT1b, cN0, cM0, G1, ER+, PR+, HER2-) - Signed by Eppie Gibson, MD on 11/27/2018   11/27/2018 Initial Diagnosis   Carcinoma of upper-outer quadrant of left breast in female, estrogen receptor positive ([place])      HISTORY OF PRESENTING ILLNESS:  Emily Andrews 84 y.o. female is a here because of newly diagnosed left breast cancer. The patient was referred by Dr. Lucia Gaskins. The patient presents to the clinic today alone. I called her daughter to be included in the visit today. Her husband lee-anne flicker is one of my other patients.    She notes her mass was found by screening mammogram. She did not feel her mass herself. She has had abnormal Mammograms from fatty tissue in the past and only required 1 left breast biopsy in the past, benign results. She denies any recent breast, chest or weight change and no pain.   Today the patient notes she had right ankle replacement in early 2020. She does not do every activity but can still mostly take care of herself. She ambulates often but will use wheelchair as needed. She notes having significant arthritis and leg weakness. She had a fall before this where she fell trying to get in a truck and fractured her right ankle.   Socially she is married and has 1 adult daughter. She retired form working in Herbalist. They have a PMHx of Afib on Warfarin, losartan and metoprolol managed by her cardiologist. She has HLD on Zetia. She was told she has diabetes but has not needed medication. She had hysterectomy and BSO due to fibroids in early 64s. She had cholecystectomy. She is taking calcium, Vit D, Zetia, Flonase, losartan, mag, metoprolol.    GYN HISTORY  Menarchal: 84 yo LMP: hysterectomy in her early 110s  Contraceptive: No  HRT: estrogen for several years. She has been off for 15-20 years.  G1P1: daughter    REVIEW OF SYSTEMS:    Constitutional: Denies fevers, chills or abnormal night sweats Eyes: Denies blurriness of vision, double vision or watery eyes Ears, nose, mouth, throat, and face: Denies mucositis or sore throat Respiratory: Denies cough, dyspnea or wheezes Cardiovascular: Denies palpitation, chest discomfort or lower extremity swelling Gastrointestinal:  Denies nausea, heartburn or change in  bowel habits Skin: Denies abnormal skin rashes Lymphatics: Denies new lymphadenopathy or easy bruising Neurological:Denies numbness, tingling or new weaknesses MSK: (+) Recent right ankle replacement, moderate ambulation (+) Arthritis and leg weakness  Behavioral/Psych: Mood is  stable, no new changes  All other systems were reviewed with the patient and are negative.   MEDICAL HISTORY:  Past Medical History:  Diagnosis Date  . Anxiety   . Arthritis    a. R>L knees  . Diabetes mellitus   . Dysrhythmia   . Environmental allergies   . Hiatal hernia   . Hyperlipidemia    a. statin intolerant - myalgias  . Paroxysmal atrial fibrillation ([facility])    a. Dx in 2004.  Off coumadin x several yrs;  b. Normal Event Monitor 03/2011.    SURGICAL HISTORY: Past Surgical History:  Procedure Laterality Date  . ABDOMINAL HYSTERECTOMY    . CHOLECYSTECTOMY    . CYSTECTOMY     lower back  . lipoma tumor removed     right shoulder  . ORIF ANKLE FRACTURE Right 03/23/2018   Procedure: OPEN REDUCTION INTERNAL FIXATION (ORIF) ANKLE FRACTURE;  Surgeon: Rogers, Jason Patrick, MD;  Location: [facility];  Service: Orthopedics;  Laterality: Right;    SOCIAL HISTORY: Social History   Socioeconomic History  . Marital status: Married    Spouse name: Not on file  . Number of children: Not on file  . Years of education: Not on file  . Highest education level: Not on file  Occupational History  . Not on file  Social Needs  . Financial resource strain: Not on file  . Food insecurity    Worry: Not on file    Inability: Not on file  . Transportation needs    Medical: No    Non-medical: No  Tobacco Use  . Smoking status: Never Smoker  . Smokeless tobacco: Never Used  Substance and Sexual Activity  . Alcohol use: No  . Drug use: No  . Sexual activity: Not on file  Lifestyle  . Physical activity    Days per week: Not on file    Minutes per session: Not on file  . Stress: Not on file  Relationships  . Social connections    Talks on phone: Not on file    Gets together: Not on file    Attends religious service: Not on file    Active member of club or organization: Not on file    Attends meetings of clubs or organizations: Not on file    Relationship status: Not on file  .  Intimate partner violence    Fear of current or ex partner: No    Emotionally abused: No    Physically abused: No    Forced sexual activity: No  Other Topics Concern  . Not on file  Social History Narrative   Lives in [place] with husband.  Active @ home.    FAMILY HISTORY: Family History  Problem Relation Age of Onset  . Liver disease Mother        cirrhosis - died in 70's  . Heart failure Mother   . Stroke Father        died in 90's  . Coronary artery disease Sister        living  . Coronary artery disease Brother        living    ALLERGIES:  is allergic to statins; oxycodone-acetaminophen; poly hist forte [doxylamine-phenylephrine]; adhesive [tape]; celebrex [celecoxib]; clindamycin/lincomycin; penicillins; phenergan [promethazine hcl];   and robaxin [methocarbamol].  MEDICATIONS:  Current Outpatient Medications  Medication Sig Dispense Refill  . Calcium Carbonate-Vitamin D (CALTRATE 600+D PO) Take 1 tablet by mouth daily.     . chlorthalidone (HYGROTON) 25 MG tablet Take 1 tablet (25 mg total) by mouth daily. 90 tablet 3  . cyanocobalamin 100 MCG tablet Take 100 mcg by mouth daily.     . ezetimibe (ZETIA) 10 MG tablet Take 10 mg by mouth every evening.   1  . flecainide (TAMBOCOR) 50 MG tablet TAKE 1 & 1/2 (ONE & ONE-HALF) TABLETS BY MOUTH TWICE DAILY 225 tablet 0  . fluticasone (FLONASE) 50 MCG/ACT nasal spray USE 2 SPRAY(S) IN EACH NOSTRIL ONCE DAILY    . losartan (COZAAR) 100 MG tablet Take 100 mg by mouth daily.    . Magnesium 250 MG TABS Take 250 mg by mouth 2 (two) times daily.   0  . metoprolol tartrate (LOPRESSOR) 25 MG tablet Take 25 mg by mouth 2 (two) times daily.     . Multiple Vitamins-Minerals (CENTRUM PO) Take 1 tablet by mouth daily.      . Omega-3 Fatty Acids (FISH OIL PO) Take 2 capsules by mouth daily.     . omeprazole (PRILOSEC) 20 MG capsule Take 1 capsule (20 mg total) by mouth daily.    . potassium chloride SA (K-DUR,KLOR-CON) 20 MEQ tablet TAKE  1 TABLET BY MOUTH DAILY (Patient taking differently: Take 20 mEq by mouth daily. ) 90 tablet 3  . trolamine salicylate (ASPERCREME) 10 % cream Apply 1 application topically daily as needed for muscle pain.    . warfarin (COUMADIN) 5 MG tablet TAKE BY MOUTH AS DIRECTED BY ANTICOAGULATION CLINIC (Patient taking differently: 2.5 mg Tuesday, Thursday, Saturday) 100 tablet 0   No current facility-administered medications for this visit.     PHYSICAL EXAMINATION: ECOG PERFORMANCE STATUS: 2 - Symptomatic, <50% confined to bed  Vitals:   11/30/18 1412  BP: (!) 159/73  Pulse: 62  Resp: 20  Temp: 97.8 F (36.6 C)  SpO2: 96%   Filed Weights   11/30/18 1412  Weight: 166 lb 4.8 oz (75.4 kg)    GENERAL:alert, no distress and comfortable SKIN: skin color, texture, turgor are normal, no rashes or significant lesions EYES: normal, Conjunctiva are pink and non-injected, sclera clear  NECK: supple, thyroid normal size, non-tender, without nodularity LYMPH:  no palpable lymphadenopathy in the cervical, axillary  LUNGS: clear to auscultation and percussion with normal breathing effort HEART: regular rate & rhythm and no murmurs and no lower extremity edema ABDOMEN:abdomen soft, non-tender and normal bowel sounds Musculoskeletal:no cyanosis of digits and no clubbing  NEURO: alert & oriented x 3 with fluent speech, no focal motor/sensory deficits BREAST: (+) Mild scar of left breast from prior biopsy. No palpable mass, nodules or adenopathy bilaterally. Breast exam benign.  LABORATORY DATA:  I have reviewed the data as listed CBC Latest Ref Rng & Units 03/23/2018 12/14/2015 12/14/2015  WBC 4.0 - 10.5 K/uL 8.7 - 7.8  Hemoglobin 12.0 - 15.0 g/dL 13.6 15.6(H) 14.6  Hematocrit 36.0 - 46.0 % 41.9 46.0 44.8  Platelets 150 - 400 K/uL 336 - 206    CMP Latest Ref Rng & Units 03/23/2018 03/15/2016 12/14/2015  Glucose 70 - 99 mg/dL 125(H) 124(H) 109(H)  BUN 8 - 23 mg/dL 20 20 18  Creatinine 0.44 - 1.00  mg/dL 0.68 0.66 0.80  Sodium 135 - 145 mmol/L 137 139 143  Potassium 3.5 - 5.1 mmol/L 3.5 3.8 4.2    Chloride 98 - 111 mmol/L 100 104 104  CO2 22 - 32 mmol/L 26 28 -  Calcium 8.9 - 10.3 mg/dL 9.5 9.2 -  Total Protein 6.5 - 8.1 g/dL - - -  Total Bilirubin 0.3 - 1.2 mg/dL - - -  Alkaline Phos 38 - 126 U/L - - -  AST 15 - 41 U/L - - -  ALT 14 - 54 U/L - - -     RADIOGRAPHIC STUDIES: I have personally reviewed the radiological images as listed and agreed with the findings in the report. No results found.  ASSESSMENT & PLAN:  Emily Andrews is a 84 y.o. caucasian female with a history of Afib, Hysterectomy with BSO, HLD, DM.   1. Carcinoma of upper-outer quadrant of left breast, Stage IA, c(T1bN0M0), ER/PR+, HER2-, Grade I -We discussed her image findings and the biopsy results in great details. She has invasive ductal carcinoma with components of DCIS.  -Given the early stage disease, she likely need a lumpectomy. She is agreeable with that. She was seen by Dr. Newman already and likely will proceed with surgery soon.  Due to her advanced age and clinically very early stage disease, sentinel lymph node biopsy was not recommended.  She plans to see her cardiologist to get cleared for surgery.  -Give her small early stage breast cancer and her age, I do not recommend chemotherapy. Will not proceed with Oncotype or mammaprint.  -The risk of recurrence depends on the stage and biology of the tumor. She is early stage, with ER/PR positive and HER2 negative markers and low grade disease, this is the more common type of slow growing tumor.  -She was also seen by radiation oncologist Dr. Squire. If her surgical sentinel lymph nodes were negative, she would not need post mastectomy radiation. Otherwise radiation is recommended to reduce the risk for local recurrence. Dr. Squire thinks her chances of needing adjuvant radiation is low.  -Given the strong ER and PR expression in her postmenopausal  status, I recommend adjuvant endocrine therapy with aromatase inhibitor or Tamoxifen for 5 years to reduce the risk of cancer recurrence. Potential benefits and side effects were discussed with patient such as menopausal symptoms, arthritis, AI can weaken her bone and Tamoxifen can strengthen her bone. She is interested. I have low threshold of stopping antiestrogen therapy if she cannot tolerate.  -Given her arthritis and previous hysterectomy, tamoxifen may be a better option for her. -Physical exam today unremarkable. Will obtain baseline labs today.  -F/u after surgery    2. Bone health, Arthritis   -She notes not having a full body DEXA scan which was normal per pt.  -I recommend new baseline after surgery to determine what antiestrogen therapy she use.  -She has arthritis of her b/l legs    3. Prior Fall and right ankle fracture  -She fell while trying to get in into a truck.  This resulted in a fractured right ankle  -She has had right ankle surgery in early 2020.  -She ambulates moderately well now and will use wheelchair as needed.   4. Afib, HLD, DM -No on medication for DM.  -On Zetia, Warfarin, metoprolol and losartan. Managed by her cardiologist and PCP   5. Anxiety and claustrophobia  -not on medication    PLAN:  -Lab today  -Proceed with surgery soon  -F/u one month after surgery    Orders Placed This Encounter  Procedures  . CBC with Differential (Cancer Center Only)      Standing Status:   Standing    Number of Occurrences:   20    Standing Expiration Date:   11/30/2023  . CMP ([facility] only)    Standing Status:   Standing    Number of Occurrences:   20    Standing Expiration Date:   11/30/2023    All questions were answered. The patient knows to call the clinic with any problems, questions or concerns. I spent 40 minutes counseling the patient face to face. The total time spent in the appointment was 50 minutes and more than 50% was on counseling.      Truitt Merle, MD 11/30/2018 3:07 PM  I, Joslyn Devon, am acting as scribe for Truitt Merle, MD.   I have reviewed the above documentation for accuracy and completeness, and I agree with the above.

## 2018-11-30 ENCOUNTER — Other Ambulatory Visit: Payer: Self-pay

## 2018-11-30 ENCOUNTER — Inpatient Hospital Stay: Payer: Medicare Other | Attending: Hematology | Admitting: Hematology

## 2018-11-30 ENCOUNTER — Inpatient Hospital Stay: Payer: Medicare Other | Attending: Hematology

## 2018-11-30 VITALS — BP 159/73 | HR 62 | Temp 97.8°F | Resp 20 | Ht 64.0 in | Wt 166.3 lb

## 2018-11-30 DIAGNOSIS — E119 Type 2 diabetes mellitus without complications: Secondary | ICD-10-CM | POA: Diagnosis not present

## 2018-11-30 DIAGNOSIS — I480 Paroxysmal atrial fibrillation: Secondary | ICD-10-CM | POA: Insufficient documentation

## 2018-11-30 DIAGNOSIS — Z7901 Long term (current) use of anticoagulants: Secondary | ICD-10-CM | POA: Diagnosis not present

## 2018-11-30 DIAGNOSIS — C50412 Malignant neoplasm of upper-outer quadrant of left female breast: Secondary | ICD-10-CM

## 2018-11-30 DIAGNOSIS — Z170 Estrogen receptor positive status [ER+]: Secondary | ICD-10-CM | POA: Insufficient documentation

## 2018-11-30 LAB — CBC WITH DIFFERENTIAL (CANCER CENTER ONLY)
Abs Immature Granulocytes: 0.01 10*3/uL (ref 0.00–0.07)
Basophils Absolute: 0.1 10*3/uL (ref 0.0–0.1)
Basophils Relative: 1 %
Eosinophils Absolute: 0.1 10*3/uL (ref 0.0–0.5)
Eosinophils Relative: 2 %
HCT: 42.4 % (ref 36.0–46.0)
Hemoglobin: 13.9 g/dL (ref 12.0–15.0)
Immature Granulocytes: 0 %
Lymphocytes Relative: 43 %
Lymphs Abs: 2.9 10*3/uL (ref 0.7–4.0)
MCH: 31 pg (ref 26.0–34.0)
MCHC: 32.8 g/dL (ref 30.0–36.0)
MCV: 94.6 fL (ref 80.0–100.0)
Monocytes Absolute: 0.7 10*3/uL (ref 0.1–1.0)
Monocytes Relative: 10 %
Neutro Abs: 3 10*3/uL (ref 1.7–7.7)
Neutrophils Relative %: 44 %
Platelet Count: 230 10*3/uL (ref 150–400)
RBC: 4.48 MIL/uL (ref 3.87–5.11)
RDW: 14.2 % (ref 11.5–15.5)
WBC Count: 6.7 10*3/uL (ref 4.0–10.5)
nRBC: 0 % (ref 0.0–0.2)

## 2018-11-30 LAB — CMP (CANCER CENTER ONLY)
ALT: 13 U/L (ref 0–44)
AST: 20 U/L (ref 15–41)
Albumin: 3.9 g/dL (ref 3.5–5.0)
Alkaline Phosphatase: 67 U/L (ref 38–126)
Anion gap: 11 (ref 5–15)
BUN: 21 mg/dL (ref 8–23)
CO2: 26 mmol/L (ref 22–32)
Calcium: 9.5 mg/dL (ref 8.9–10.3)
Chloride: 103 mmol/L (ref 98–111)
Creatinine: 0.89 mg/dL (ref 0.44–1.00)
GFR, Est AFR Am: >60 mL/min (ref >60)
GFR, Estimated: >60 mL/min (ref >60)
Glucose, Bld: 92 mg/dL (ref 70–99)
Potassium: 4 mmol/L (ref 3.5–5.1)
Sodium: 140 mmol/L (ref 135–145)
Total Bilirubin: 0.8 mg/dL (ref 0.3–1.2)
Total Protein: 6.9 g/dL (ref 6.5–8.1)

## 2018-12-01 ENCOUNTER — Encounter: Payer: Self-pay | Admitting: Hematology

## 2018-12-03 ENCOUNTER — Telehealth: Payer: Self-pay | Admitting: Hematology

## 2018-12-03 ENCOUNTER — Encounter: Payer: Self-pay | Admitting: *Deleted

## 2018-12-03 NOTE — Telephone Encounter (Signed)
No los per 8/28. °

## 2018-12-12 NOTE — Progress Notes (Signed)
Chief Complaint  Patient presents with  . Follow-up    atrial fibrillation   History of Present Illness: 84 yo female with history of paroxysmal atrial fibrillation, DM and HLD who is here today for cardiac follow up. She had been followed by Dr. Aundra Dubin. She was originally admitted March 2013 with symptomatic atrial fib with RVR. She converted to sinus on Diltiazem drip. She was started on Xarelto and dronedarone but she could not afford these medications. She was changed to flecainide, metoprolol and coumadin. She has had no issues with recurrent atrial fibrillation over the last few years. Echo September 2019 with normal LV systolic function, GEXB=28-41%, grade 1 diastolic dysfunction, mild MR.   She is here today for follow up. The patient denies any chest pain, dyspnea, palpitations, lower extremity edema, orthopnea, PND, dizziness, near syncope or syncope. She has been diagnosed with breast cancer. She is planning to have surgery with Dr. Lucia Gaskins.   Primary Care Physician: Orpah Melter, MD  Past Medical History:  Diagnosis Date  . Anxiety   . Arthritis    a. R>L knees  . Diabetes mellitus   . Dysrhythmia   . Environmental allergies   . Hiatal hernia   . Hyperlipidemia    a. statin intolerant - myalgias  . Paroxysmal atrial fibrillation ([place])    a. Dx in 2004.  Off coumadin x several yrs;  b. Normal Event Monitor 03/2011.    Past Surgical History:  Procedure Laterality Date  . ABDOMINAL HYSTERECTOMY    . CHOLECYSTECTOMY    . CYSTECTOMY     lower back  . lipoma tumor removed     right shoulder  . ORIF ANKLE FRACTURE Right 03/23/2018   Procedure: OPEN REDUCTION INTERNAL FIXATION (ORIF) ANKLE FRACTURE;  Surgeon: Nicholes Stairs, MD;  Location: [facility];  Service: Orthopedics;  Laterality: Right;    Current Outpatient Medications  Medication Sig Dispense Refill  . Calcium Carbonate-Vitamin D (CALTRATE 600+D PO) Take 1 tablet by mouth daily.     . chlorthalidone  (HYGROTON) 25 MG tablet Take 1 tablet (25 mg total) by mouth daily. 90 tablet 3  . cyanocobalamin 100 MCG tablet Take 100 mcg by mouth daily.     Marland Kitchen ezetimibe (ZETIA) 10 MG tablet Take 10 mg by mouth every evening.   1  . flecainide (TAMBOCOR) 50 MG tablet TAKE 1 & 1/2 (ONE & ONE-HALF) TABLETS BY MOUTH TWICE DAILY 225 tablet 0  . fluticasone (FLONASE) 50 MCG/ACT nasal spray USE 2 SPRAY(S) IN EACH NOSTRIL ONCE DAILY    . losartan (COZAAR) 100 MG tablet Take 100 mg by mouth daily.    . Magnesium 250 MG TABS Take 250 mg by mouth 2 (two) times daily.   0  . metoprolol tartrate (LOPRESSOR) 25 MG tablet Take 25 mg by mouth 2 (two) times daily.     . Multiple Vitamins-Minerals (CENTRUM PO) Take 1 tablet by mouth daily.      . Omega-3 Fatty Acids (FISH OIL PO) Take 2 capsules by mouth daily.     Marland Kitchen omeprazole (PRILOSEC OTC) 20 MG tablet Take 1 tablet by mouth daily.    Marland Kitchen omeprazole (PRILOSEC) 20 MG capsule Take 1 capsule (20 mg total) by mouth daily.    . potassium chloride SA (K-DUR,KLOR-CON) 20 MEQ tablet TAKE 1 TABLET BY MOUTH DAILY (Patient taking differently: Take 20 mEq by mouth daily. ) 90 tablet 3  . trolamine salicylate (ASPERCREME) 10 % cream Apply 1 application topically daily as  needed for muscle pain.    Marland Kitchen warfarin (COUMADIN) 5 MG tablet TAKE BY MOUTH AS DIRECTED BY ANTICOAGULATION CLINIC (Patient taking differently: 2.5 mg Tuesday, Thursday, Saturday) 100 tablet 0   No current facility-administered medications for this visit.     Allergies  Allergen Reactions  . Statins Other (See Comments)    Atrophy of leg muscle according to patient.  . Oxycodone-Acetaminophen Other (See Comments)  . Poly Hist Forte [Doxylamine-Phenylephrine]   . Adhesive [Tape] Itching, Rash and Other (See Comments)    Pt allergic to monitor electrodes  . Celebrex [Celecoxib] Other (See Comments)    unknown  . Clindamycin/Lincomycin Rash  . Penicillins Other (See Comments)    Has patient had a PCN reaction  causing immediate rash, facial/tongue/throat swelling, SOB or lightheadedness with hypotension: Y Has patient had a PCN reaction causing severe rash involving mucus membranes or skin necrosis: Y Has patient had a PCN reaction that required hospitalization: N Has patient had a PCN reaction occurring within the last 10 years: N If all of the above answers are "NO", then may proceed with Cephalosporin use.   Marland Kitchen Phenergan [Promethazine Hcl] Other (See Comments)    unknown  . Robaxin [Methocarbamol] Other (See Comments)    unknown    Social History   Socioeconomic History  . Marital status: Married    Spouse name: Not on file  . Number of children: Not on file  . Years of education: Not on file  . Highest education level: Not on file  Occupational History  . Not on file  Social Needs  . Financial resource strain: Not on file  . Food insecurity    Worry: Not on file    Inability: Not on file  . Transportation needs    Medical: No    Non-medical: No  Tobacco Use  . Smoking status: Never Smoker  . Smokeless tobacco: Never Used  Substance and Sexual Activity  . Alcohol use: No  . Drug use: No  . Sexual activity: Not on file  Lifestyle  . Physical activity    Days per week: Not on file    Minutes per session: Not on file  . Stress: Not on file  Relationships  . Social Herbalist on phone: Not on file    Gets together: Not on file    Attends religious service: Not on file    Active member of club or organization: Not on file    Attends meetings of clubs or organizations: Not on file    Relationship status: Not on file  . Intimate partner violence    Fear of current or ex partner: No    Emotionally abused: No    Physically abused: No    Forced sexual activity: No  Other Topics Concern  . Not on file  Social History Narrative   Lives in [place] with husband.  Active @ home.    Family History  Problem Relation Age of Onset  . Liver disease Mother         cirrhosis - died in 63's  . Heart failure Mother   . Stroke Father        died in 73's  . Coronary artery disease Sister        living  . Coronary artery disease Brother        living    Review of Systems:  As stated in the HPI and otherwise negative.   BP 140/72  Pulse (!) 52   Ht 5\' 4"  (1.626 m)   Wt 168 lb (76.2 kg)   SpO2 96%   BMI 28.84 kg/m   Physical Examination:  General: Well developed, well nourished, NAD  HEENT: OP clear, mucus membranes moist  SKIN: warm, dry. No rashes. Neuro: No focal deficits  Musculoskeletal: Muscle strength 5/5 all ext  Psychiatric: Mood and affect normal  Neck: No JVD, no carotid bruits, no thyromegaly, no lymphadenopathy.  Lungs:Clear bilaterally, no wheezes, rhonci, crackles Cardiovascular: Regular rate and rhythm. No murmurs, gallops or rubs. Abdomen:Soft. Bowel sounds present. Non-tender.  Extremities: No lower extremity edema. Pulses are 2 + in the bilateral DP/PT.  EKG:  EKG is ordered today. The ekg ordered today demonstrates Sinus brady, rate 52 bpm. 1st degree AV block. Poor R wave progression. Unchanged  Recent Labs: 11/30/2018: ALT 13; BUN 21; Creatinine 0.89; Hemoglobin 13.9; Platelet Count 230; Potassium 4.0; Sodium 140   Lipid Panel    Component Value Date/Time   CHOL 170 02/08/2016 0814   TRIG 124 02/08/2016 0814   HDL 59 02/08/2016 0814   CHOLHDL 2.9 02/08/2016 0814   VLDL 25 02/08/2016 0814   LDLCALC 86 02/08/2016 0814     Wt Readings from Last 3 Encounters:  12/13/18 168 lb (76.2 kg)  11/30/18 166 lb 4.8 oz (75.4 kg)  03/23/18 166 lb (75.3 kg)     Other studies Reviewed: Additional studies/ records that were reviewed today include: . Review of the above records demonstrates:   Assessment and Plan:   1. Atrial fibrillation, paroxysmal: No episodes of palpitations. She is in sinus today. Continue metoprolol, flecainide and coumadin.    2. Pre-operative cardiovascular examination: No signs of angina,  CHF or uncontrolled arrhythmias. She can proceed her planned surgical procedure. She can hold coumadin 5 days before her planned procedure.   Current medicines are reviewed at length with the patient today.  The patient does not have concerns regarding medicines.  The following changes have been made:  no change  Labs/ tests ordered today include:   Orders Placed This Encounter  Procedures  . EKG 12-Lead   Disposition:   FU with me in 12  months  Signed, Lauree Chandler, MD 12/13/2018 8:48 AM    [facility] [place], [place],   [address] Phone: [phone]; Fax: [phone]

## 2018-12-13 ENCOUNTER — Other Ambulatory Visit: Payer: Self-pay | Admitting: *Deleted

## 2018-12-13 ENCOUNTER — Ambulatory Visit (INDEPENDENT_AMBULATORY_CARE_PROVIDER_SITE_OTHER): Payer: Medicare Other | Admitting: *Deleted

## 2018-12-13 ENCOUNTER — Other Ambulatory Visit: Payer: Self-pay

## 2018-12-13 ENCOUNTER — Encounter: Payer: Self-pay | Admitting: Cardiovascular Disease

## 2018-12-13 ENCOUNTER — Ambulatory Visit (INDEPENDENT_AMBULATORY_CARE_PROVIDER_SITE_OTHER): Payer: Medicare Other | Admitting: Cardiovascular Disease

## 2018-12-13 VITALS — BP 140/72 | HR 52 | Ht 64.0 in | Wt 168.0 lb

## 2018-12-13 DIAGNOSIS — I480 Paroxysmal atrial fibrillation: Secondary | ICD-10-CM | POA: Diagnosis not present

## 2018-12-13 DIAGNOSIS — I4891 Unspecified atrial fibrillation: Secondary | ICD-10-CM | POA: Diagnosis not present

## 2018-12-13 DIAGNOSIS — Z7901 Long term (current) use of anticoagulants: Secondary | ICD-10-CM

## 2018-12-13 DIAGNOSIS — Z01810 Encounter for preprocedural cardiovascular examination: Secondary | ICD-10-CM

## 2018-12-13 DIAGNOSIS — Z5181 Encounter for therapeutic drug level monitoring: Secondary | ICD-10-CM | POA: Diagnosis not present

## 2018-12-13 LAB — POCT INR: INR: 2.3 (ref 2.0–3.0)

## 2018-12-13 MED ORDER — FLECAINIDE ACETATE 50 MG PO TABS: 75.0000 mg | ORAL_TABLET | Freq: Two times a day (BID) | ORAL | 3 refills | Status: AC

## 2018-12-13 MED ORDER — WARFARIN SODIUM 5 MG PO TABS: ORAL_TABLET | ORAL | 1 refills | Status: AC

## 2018-12-13 MED ORDER — POTASSIUM CHLORIDE CRYS ER 20 MEQ PO TBCR: 20.0000 meq | EXTENDED_RELEASE_TABLET | Freq: Every day | ORAL | 3 refills | Status: AC

## 2018-12-13 NOTE — Addendum Note (Signed)
Addended by: Harland German A on: 12/13/2018 08:57 AM   Modules accepted: Orders

## 2018-12-13 NOTE — Patient Instructions (Signed)
Medication Instructions:  Your provider recommends that you continue on your current medications as directed. Please refer to the Current Medication list given to you today.    Labwork: None  Testing/Procedures: None  Follow-Up: Your provider wants you to follow-up in: 1 year with Dr. McAlhany. You will receive a reminder letter in the mail two months in advance. If you don't receive a letter, please call our office to schedule the follow-up appointment.    Any Other Special Instructions Will Be Listed Below (If Applicable).     If you need a refill on your cardiac medications before your next appointment, please call your pharmacy.   

## 2018-12-13 NOTE — Patient Instructions (Signed)
Description   Continue on same dosage 1 tablet daily except 1/2 tablet each Tuesdays, Thursdays, and Saturdays. Continue eating leafy green vegetable intake of 3 servings each week. Repeat INR in 6 weeks. Call if scheduled for any surgery or any new medications #[phone] [facility].    

## 2018-12-15 ENCOUNTER — Other Ambulatory Visit (HOSPITAL_COMMUNITY): Payer: Self-pay | Admitting: Surgery

## 2018-12-15 DIAGNOSIS — Z170 Estrogen receptor positive status [ER+]: Secondary | ICD-10-CM

## 2018-12-15 DIAGNOSIS — C50912 Malignant neoplasm of unspecified site of left female breast: Secondary | ICD-10-CM

## 2018-12-21 ENCOUNTER — Encounter: Payer: Self-pay | Admitting: *Deleted

## 2018-12-21 ENCOUNTER — Telehealth: Payer: Self-pay | Admitting: Hematology

## 2018-12-21 NOTE — Telephone Encounter (Signed)
Scheduled appt per 9/17 sch message - pt aware of appt date and time

## 2018-12-27 ENCOUNTER — Telehealth: Payer: Self-pay | Admitting: Hematology

## 2018-12-27 NOTE — Telephone Encounter (Signed)
Returned patient's phone call regarding rescheduling October appointments, per patient's request appointment has been moved to 11/02.

## 2019-01-10 NOTE — Pre-Procedure Instructions (Signed)
[place], [place] - [address] [place] [address] Phone: [phone] Fax: [phone]  [facility] (MAIL ORDER) [place], [place] [address] Phone: [phone] Fax: [place], [place] - [address] [place] [address] [place] [address] [place] [address] Phone: [phone] Fax: [phone]  [id number] - [place], [place] - [address] [place] [address] Phone: [phone] Fax: [phone]      Your procedure is scheduled on 01-17-19  Report to [facility] Main Entrance "A" at 0530 A.M., and check in at the Admitting office.  Call this number if you have problems the morning of surgery:  [phone]  Call [phone] if you have any questions prior to your surgery date Monday-Friday 8am-4pm    Remember:  Do not eat  after midnight the night before your surgery  You may drink clear liquids until 0430AM the morning of your surgery.   Clear liquids allowed are: Water, Non-Citrus Juices (without pulp), Carbonated Beverages, Clear Tea, Black Coffee Only, and Gatorade   Take these medicines the morning of surgery with A SIP OF WATER : omeprazole (PRILOSEC) metoprolol tartrate (LOPRESSOR) flecainide (TAMBOCOR) fluticasone (FLONASE)as needed  Follow your surgeon's instructions on when to stop WARFARIN.  If no instructions were given by your surgeon then you will need to call the office to get those instructions.    7 days prior to surgery STOP taking any Aspirin (unless otherwise instructed by your surgeon), Aleve, Naproxen, Ibuprofen, Motrin, Advil, Goody's, BC's, all herbal medications, fish oil, and all vitamins.    The Morning of Surgery  Do not wear jewelry, make-up or nail polish.  Do not wear  lotions, powders, or perfumes, or deodorant  Do not shave 48 hours prior to surgery.    Do not bring valuables to the hospital.  [facility] is not responsible for any belongings or valuables.  If you are a smoker, DO NOT Smoke 24 hours prior to surgery IF you wear a CPAP at night please bring your mask, tubing, and machine the morning of surgery   Remember that you must have someone to transport you home after your surgery, and remain with you for 24 hours if you are discharged the same day.   Contacts, glasses, hearing aids, dentures or bridgework may not be worn into surgery.    Leave your suitcase in the car.  After surgery it may be brought to your room.  For patients admitted to the hospital, discharge time will be determined by your treatment team.  Patients discharged the day of surgery will not be allowed to drive home.    Special instructions:   [facility]- Preparing For Surgery  Before surgery, you can play an important role. Because skin is not sterile, your skin needs to be as free of germs as possible. You can reduce the number of germs on your skin by washing with CHG (chlorahexidine gluconate) Soap before surgery.  CHG is an antiseptic cleaner which kills germs and bonds with the skin to continue killing germs even after washing.    Oral Hygiene is also important to reduce your risk of infection.  Remember - BRUSH YOUR TEETH THE MORNING OF SURGERY WITH YOUR REGULAR TOOTHPASTE  Please do not use if you have  an allergy to CHG or antibacterial soaps. If your skin becomes reddened/irritated stop using the CHG.  Do not shave (including legs and underarms) for at least 48 hours prior to first CHG shower. It is OK to shave your face.  Please follow these instructions carefully.   1. Shower the NIGHT BEFORE SURGERY and the MORNING OF SURGERY with CHG Soap.   2. If you chose to wash your hair, wash your hair first as usual with your normal shampoo.  3. After you shampoo,  rinse your hair and body thoroughly to remove the shampoo.  4. Use CHG as you would any other liquid soap. You can apply CHG directly to the skin and wash gently with a scrungie or a clean washcloth.   5. Apply the CHG Soap to your body ONLY FROM THE NECK DOWN.  Do not use on open wounds or open sores. Avoid contact with your eyes, ears, mouth and genitals (private parts). Wash Face and genitals (private parts)  with your normal soap.   6. Wash thoroughly, paying special attention to the area where your surgery will be performed.  7. Thoroughly rinse your body with warm water from the neck down.  8. DO NOT shower/wash with your normal soap after using and rinsing off the CHG Soap.  9. Pat yourself dry with a CLEAN TOWEL.  10. Wear CLEAN PAJAMAS to bed the night before surgery, wear comfortable clothes the morning of surgery  11. Place CLEAN SHEETS on your bed the night of your first shower and DO NOT SLEEP WITH PETS.    Day of Surgery:  Do not apply any deodorants/lotions. Please shower the morning of surgery with the CHG soap  Please wear clean clothes to the hospital/surgery center.   Remember to brush your teeth WITH YOUR REGULAR TOOTHPASTE.   Please read over the  fact sheets that you were given.

## 2019-01-11 ENCOUNTER — Encounter (HOSPITAL_COMMUNITY)
Admission: RE | Admit: 2019-01-11 | Discharge: 2019-01-11 | Disposition: A | Discharge status: Home / Self Care | Payer: Medicare Other | Source: Ambulatory Visit | Attending: Surgery | Admitting: Surgery

## 2019-01-11 ENCOUNTER — Other Ambulatory Visit: Payer: Self-pay

## 2019-01-11 ENCOUNTER — Ambulatory Visit (INDEPENDENT_AMBULATORY_CARE_PROVIDER_SITE_OTHER): Payer: Medicare Other | Admitting: *Deleted

## 2019-01-11 ENCOUNTER — Encounter (HOSPITAL_COMMUNITY): Payer: Self-pay

## 2019-01-11 DIAGNOSIS — Z5181 Encounter for therapeutic drug level monitoring: Secondary | ICD-10-CM

## 2019-01-11 DIAGNOSIS — Z170 Estrogen receptor positive status [ER+]: Secondary | ICD-10-CM | POA: Insufficient documentation

## 2019-01-11 DIAGNOSIS — I4891 Unspecified atrial fibrillation: Secondary | ICD-10-CM | POA: Diagnosis not present

## 2019-01-11 DIAGNOSIS — Z01812 Encounter for preprocedural laboratory examination: Secondary | ICD-10-CM | POA: Diagnosis not present

## 2019-01-11 DIAGNOSIS — Z79899 Other long term (current) drug therapy: Secondary | ICD-10-CM | POA: Diagnosis not present

## 2019-01-11 DIAGNOSIS — C50912 Malignant neoplasm of unspecified site of left female breast: Secondary | ICD-10-CM | POA: Insufficient documentation

## 2019-01-11 DIAGNOSIS — I10 Essential (primary) hypertension: Secondary | ICD-10-CM | POA: Diagnosis not present

## 2019-01-11 DIAGNOSIS — Z7901 Long term (current) use of anticoagulants: Secondary | ICD-10-CM | POA: Insufficient documentation

## 2019-01-11 DIAGNOSIS — E119 Type 2 diabetes mellitus without complications: Secondary | ICD-10-CM | POA: Diagnosis not present

## 2019-01-11 HISTORY — DX: Malignant (primary) neoplasm, unspecified: C80.1

## 2019-01-11 HISTORY — DX: Essential (primary) hypertension: I10

## 2019-01-11 LAB — BASIC METABOLIC PANEL
Anion gap: 10 (ref 5–15)
BUN: 17 mg/dL (ref 8–23)
CO2: 24 mmol/L (ref 22–32)
Calcium: 9.4 mg/dL (ref 8.9–10.3)
Chloride: 104 mmol/L (ref 98–111)
Creatinine, Ser: 0.87 mg/dL (ref 0.44–1.00)
GFR calc Af Amer: >60 mL/min (ref >60)
GFR calc non Af Amer: >60 mL/min (ref >60)
Glucose, Bld: 117 mg/dL — ABNORMAL HIGH (ref 70–99)
Potassium: 4.1 mmol/L (ref 3.5–5.1)
Sodium: 138 mmol/L (ref 135–145)

## 2019-01-11 LAB — GLUCOSE, CAPILLARY: Glucose-Capillary: 104 mg/dL — ABNORMAL HIGH (ref 70–99)

## 2019-01-11 LAB — CBC
HCT: 43.5 % (ref 36.0–46.0)
Hemoglobin: 14.2 g/dL (ref 12.0–15.0)
MCH: 31.4 pg (ref 26.0–34.0)
MCHC: 32.6 g/dL (ref 30.0–36.0)
MCV: 96.2 fL (ref 80.0–100.0)
Platelets: 236 10*3/uL (ref 150–400)
RBC: 4.52 MIL/uL (ref 3.87–5.11)
RDW: 13.7 % (ref 11.5–15.5)
WBC: 7.6 10*3/uL (ref 4.0–10.5)
nRBC: 0 % (ref 0.0–0.2)

## 2019-01-11 LAB — POCT INR: INR: 2.8 (ref 2.0–3.0)

## 2019-01-11 NOTE — Patient Instructions (Addendum)
Description    Hold coumadin 5 days prior to procedure then continue on same dosage 1 tablet daily except 1/2 tablet each Tuesdays, Thursdays, and Saturdays. Repeat INR 1 week post procedure. Call if scheduled for any surgery or any new medications [phone] [facility].

## 2019-01-11 NOTE — Progress Notes (Signed)
PCP - Orpah Melter Cardiologist - [place]  Chest x-ray - n/a EKG - 12-13-18 Stress Test - 07-12-11 ECHO - 12-14-17   DM - Type 2 Fasting Blood Sugar - low 100s   Blood Thinner Instructions: Pt instructed to follow surgeon's instructions on when to stop Coumadin.  Last dose will be today, 01-11-19, per patient  ERAS Protcol - yes, no drink ordered  COVID TEST- Monday, Oct. 12th   Anesthesia review: yes, history of A-Fib  Patient denies shortness of breath, fever, cough and chest pain at PAT appointment   Patient verbalized understanding of instructions that were given to them at the PAT appointment. Patient was also instructed that they will need to review over the PAT instructions again at home before surgery.

## 2019-01-14 ENCOUNTER — Other Ambulatory Visit (HOSPITAL_COMMUNITY)
Admission: RE | Admit: 2019-01-14 | Discharge: 2019-01-14 | Disposition: A | Discharge status: Home / Self Care | Payer: Medicare Other | Source: Ambulatory Visit | Attending: Surgery | Admitting: Surgery

## 2019-01-14 DIAGNOSIS — Z01812 Encounter for preprocedural laboratory examination: Secondary | ICD-10-CM | POA: Diagnosis present

## 2019-01-14 DIAGNOSIS — Z20828 Contact with and (suspected) exposure to other viral communicable diseases: Secondary | ICD-10-CM | POA: Diagnosis not present

## 2019-01-14 LAB — SARS CORONAVIRUS 2 (TAT 6-24 HRS): SARS Coronavirus 2: NEGATIVE

## 2019-01-14 NOTE — Progress Notes (Signed)
Anesthesia Chart Review: Follows with cardiology for hx of paroxysmal afib. Last seen by Dr. Angelena Form 12/13/18 for preop clearance. Per his note "No signs of angina, CHF or uncontrolled arrhythmias. She can proceed her planned surgical procedure. She can hold coumadin 5 days before her planned procedure."  TTE 12/14/17: - Left ventricle: The cavity size was normal. Systolic function was   normal. The estimated ejection fraction was in the range of 55%   to 60%. Doppler parameters are consistent with abnormal left   ventricular relaxation (grade 1 diastolic dysfunction). - Mitral valve: Calcified annulus. There was mild regurgitation. - Left atrium: The atrium was mildly dilated. - Tricuspid valve: There was trivial regurgitation. Impressions: - Compared to the prior study, there has been no significant   interval change.   Wynonia Musty [facility] Short Stay Center/Anesthesiology Phone [phone] 01/14/2019 10:01 AM

## 2019-01-14 NOTE — Anesthesia Preprocedure Evaluation (Addendum)
Anesthesia Evaluation  Patient identified by MRN, date of birth, ID band Patient awake    Reviewed: Allergy & Precautions, NPO status , Patient's Chart, lab work & pertinent test results  Airway Mallampati: II  TM Distance: >3 FB Neck ROM: Full    Dental  (+) Edentulous Upper, Edentulous Lower, Dental Advisory Given   Pulmonary    Pulmonary exam normal        Cardiovascular hypertension, Pt. on medications and Pt. on home beta blockers Normal cardiovascular exam+ dysrhythmias Atrial Fibrillation      Neuro/Psych Anxiety    GI/Hepatic   Endo/Other  diabetes  Renal/GU      Musculoskeletal   Abdominal   Peds  Hematology   Anesthesia Other Findings   Reproductive/Obstetrics                           Anesthesia Physical Anesthesia Plan  ASA: II  Anesthesia Plan: General   Post-op Pain Management:    Induction: Intravenous  PONV Risk Score and Plan: 3 and Ondansetron, Treatment may vary due to age or medical condition and Midazolam  Airway Management Planned: LMA  Additional Equipment:   Intra-op Plan:   Post-operative Plan: Extubation in OR  Informed Consent: I have reviewed the patients History and Physical, chart, labs and discussed the procedure including the risks, benefits and alternatives for the proposed anesthesia with the patient or authorized representative who has indicated his/her understanding and acceptance.       Plan Discussed with: CRNA, Surgeon and Anesthesiologist  Anesthesia Plan Comments: (Follows with cardiology for hx of paroxysmal afib. Last seen by Dr. Angelena Form 12/13/18 for preop clearance. Per his note "No signs of angina, CHF or uncontrolled arrhythmias. She can proceed her planned surgical procedure. She can hold coumadin 5 days before her planned procedure."  TTE 12/14/17: - Left ventricle: The cavity size was normal. Systolic function was   normal.  The estimated ejection fraction was in the range of 55%   to 60%. Doppler parameters are consistent with abnormal left   ventricular relaxation (grade 1 diastolic dysfunction). - Mitral valve: Calcified annulus. There was mild regurgitation. - Left atrium: The atrium was mildly dilated. - Tricuspid valve: There was trivial regurgitation. Impressions: - Compared to the prior study, there has been no significant   interval change. )      Anesthesia Quick Evaluation

## 2019-01-15 NOTE — H&P (Signed)
Emily Andrews  Location: [facility] Patient #: [id number] DOB: [birth year] Married / Language: English / Race: White Female  History of Present Illness   The patient is a 84 year old female who presents with a complaint of breast cancer.  The PCP is Dr. Anne Shutter.  The patient was referred by Dr. [name]. Hawkins.  She comes with her daughter, Emily Andrews.  She was found to have a 0.7 cm mass on mammogram at [facility]. She had a biopsy at [facility] on 10/29/2018 at 1 o'clock in her left breast ([id number]) - IDC, ER - 100%, PR - 100%, Ki67 - 10%, and Her2neu - neg. She has no family history of breast cancer. She is not on hormone replacement.  I discussed the options for breast cancer treatment with the patient. I discussed a multidisciplinary approach to the treatment of breast cancer, which includes medical oncology and radiation oncology. I discussed the surgical options of lumpectomy vs. mastectomy. If mastectomy, there is the possibility of reconstruction. I discussed the options of lymph node biopsy. The treatment plan depends on the pathologic staging of the tumor and the patient's personal wishes. The risks of surgery include, but are not limited to, bleeding, infection, the need for further surgery, and nerve injury. The patient has been given literature on the treatment of breast cancer. She is a good candidate for left breast lumpectomy. She is worried about anesthesia messing up her memory. She seems somewhat forgetful, but consistent with her age.  Plan: 1) Cardiac clearance and coumadin management, 2) Radiation and medical oncology consultation, 3) left lumpecotmy (seed localization) (no sentinel lymph node)  Review of Systems as stated in this history (HPI) or in the review of systems. Otherwise all other 12 point ROS are negative  Past Medical History: 1. Left breast cancer 2. A. Fib She sees Dr.[name]  (she did see Dr. [name] - he goes to her church) 3. Anticoagulated on coumadin INR - 2.6 on 11/01/2018 4. Right ankle fx - 03/2018 5. DM - diet controlled normal HgbA1C 6. History of hysterectomy ([place]) and cholecystectomy 7. Back surgery - 2012 - [facility] 8. Knee injections - [place] - getting shots in his knees 9. She thinks that anesthesia messes up her mind  Social History: Married. Her husband has hearing trouble. Daughter - Emily Andrews   Past Surgical History ([place], [place]; 11/15/2018 8:44 AM) Breast Biopsy  Left. Cataract Surgery  Bilateral. Foot Surgery  Right. Gallbladder Surgery - Laparoscopic  Hysterectomy (not due to cancer) - Complete  Knee Surgery  Right. Spinal Surgery - Lower Back   Diagnostic Studies History [name], CMA; 11/15/2018 8:44 AM) Colonoscopy  5-10 years ago Mammogram  within last year  Allergies [name], CMA; 11/15/2018 8:47 AM) Penicillins  Phenelzine Sulfate *ANTIDEPRESSANTS*  Polycin *OPHTHALMIC AGENTS*  Pravastatin Sodium *ANTIHYPERLIPIDEMICS*  Clindamycin HCl *ANTI-INFECTIVE AGENTS - MISC.*  CeleBREX *ANALGESICS - ANTI-INFLAMMATORY*  Allergies Reconciled   Medication History ([name], CMA; 11/15/2018 8:47 AM) Warfarin Sodium (5MG Tablet, Oral) Active. Potassium Chloride Crys ER (20MEQ Tablet ER, Oral) Active. Metoprolol Tartrate (25MG Tablet, Oral) Active. Losartan Potassium (100MG Tablet, Oral) Active. Fluticasone Propionate (50MCG/ACT Suspension, Nasal) Active. Flecainide Acetate (50MG Tablet, Oral) Active. Ezetimibe (10MG Tablet, Oral) Active. Ondansetron (4MG Tablet Disint, Oral) Active. Chlorthalidone (25MG Tablet, Oral) Active. Cefdinir (300MG Capsule, Oral) Active. diazePAM (2MG Tablet, Oral) Active. Medications Reconciled  Social History [name], CMA; 11/15/2018 8:44 AM) No alcohol use  No caffeine use  No drug use  Tobacco use  Never  smoker.  Family History [name], [place]; 11/15/2018 8:44 AM) Alcohol Abuse  Brother. Arthritis  Father. Cerebrovascular Accident  Father, Mother. Diabetes Mellitus  Mother. Heart Disease  Brother, Sister. Hypertension  Daughter.  Pregnancy / Birth History [name], [place]; 11/15/2018 8:44 AM) Age at menarche  34 years. Age of menopause  3-55 Gravida  1 Irregular periods  Para  1  Other Problems [name], CMA; 11/15/2018 8:44 AM) Atrial Fibrillation  Back Pain  Breast Cancer  Diabetes Mellitus  Gastroesophageal Reflux Disease  High blood pressure  Hypercholesterolemia  Lump In Breast  Oophorectomy     Review of Systems ([name] CMA; 11/15/2018 8:44 AM) General Not Present- Appetite Loss, Chills, Fatigue, Fever, Night Sweats, Weight Gain and Weight Loss. Skin Not Present- Change in Wart/Mole, Dryness, Hives, Jaundice, New Lesions, Non-Healing Wounds, Rash and Ulcer. HEENT Present- Ringing in the Ears. Not Present- Earache, Hearing Loss, Hoarseness, Nose Bleed, Oral Ulcers, Seasonal Allergies, Sinus Pain, Sore Throat, Visual Disturbances, Wears glasses/contact lenses and Yellow Eyes. Respiratory Present- Chronic Cough. Not Present- Bloody sputum, Difficulty Breathing, Snoring and Wheezing. Breast Present- Breast Mass. Not Present- Breast Pain, Nipple Discharge and Skin Changes. Cardiovascular Present- Rapid Heart Rate. Not Present- Chest Pain, Difficulty Breathing Lying Down, Leg Cramps, Palpitations, Shortness of Breath and Swelling of Extremities. Gastrointestinal Not Present- Abdominal Pain, Bloating, Bloody Stool, Change in Bowel Habits, Chronic diarrhea, Constipation, Difficulty Swallowing, Excessive gas, Gets full quickly at meals, Hemorrhoids, Indigestion, Nausea, Rectal Pain and Vomiting. Female Genitourinary Not Present- Frequency, Nocturia, Painful Urination, Pelvic Pain and Urgency. Musculoskeletal Present- Back Pain and Muscle  Weakness. Not Present- Joint Pain, Joint Stiffness, Muscle Pain and Swelling of Extremities. Neurological Present- Trouble walking. Not Present- Decreased Memory, Fainting, Headaches, Numbness, Seizures, Tingling, Tremor and Weakness. Psychiatric Not Present- Anxiety, Bipolar, Change in Sleep Pattern, Depression, Fearful and Frequent crying. Endocrine Not Present- Cold Intolerance, Excessive Hunger, Hair Changes, Heat Intolerance, Hot flashes and New Diabetes. Hematology Present- Blood Thinners. Not Present- Easy Bruising, Excessive bleeding, Gland problems, HIV and Persistent Infections.  Vitals ([name] CMA; 11/15/2018 8:48 AM) 11/15/2018 8:47 AM Weight: 165.6 lb Height: 64in Body Surface Area: 1.81 m Body Mass Index: 28.42 kg/m  Temp.: 97.9F (Temporal)  Pulse: 99 (Regular)  BP: 148/87(Sitting, Left Arm, Standard)   Physical Exam  General: Older wN WF who is alert and generally healthy appearing. She uses a walked to get around. She needed help with her right leg to get it on the exam table. She is wearing a mask. Skin: Inspection and palpation of the skin unremarkable.  Eyes: Conjunctivae white, pupils equal. Face, ears, nose, mouth, and throat: Face - normal. Normal ears and nose. Lips and teeth normal.  Neck: Supple. No mass. Trachea midline. No thyroid mass.  Lymph Nodes: No supraclavicular or cervical adenopathy. No axillary adenopathy.  Lungs: Normal respiratory effort. Clear to auscultation and symmetric breath sounds. Cardiovascular: Occasional irregular rhythm. Normal auscultation of the heart. No murmur or rub.  Breasts: Right - Ptotic. Moderate in size. No mass  Left - Ptotic. Moderate in size. 1.5 cm mass at the 10 o'clock position about 6 cm off the areola, towards the axilla.  Abdomen: Soft. No mass. Liver and spleen not palpable. No tenderness. No hernia. Normal bowel sounds.  Rectal: Not done.  Musculoskeletal/extremities: Using  walker. Still recovering from right ankle surgery, so she is moving slowly.  Neurologic: Grossly intact to motor and sensory function.   Psychiatric: Has normal mood and  affect. Judgement and insight appear normal.    Assessment & Plan  1.  MALIGNANT NEOPLASM OF LEFT BREAST, STAGE 1, ESTROGEN RECEPTOR POSITIVE (C50.912)  Story: left breast biopsy 10/29/2018 at 1 o'clock - ([id number]) - IDC, ER - 100%, PR - 100%, Ki67 - 10%, and Her2neu - neg  Plan:   1) Cardiac clearance and coumadin management,   (see below)  2) Radiation and medical oncology consultation,   She saw Dr. Pearlie Oyster and Dr. Burr Medico  3) left breast lumpectomy (seed localization) (no sentinel lymph node)   2.  ANTICOAGULATED (Z79.01)  INR - 2.6 on 11/01/2018  3. A. Fib  She sees Dr.[name] (she did see Dr. [name] - he goes to her church)  Addendum Note(Itai Barbian H. Lucia Gaskins MD; 12/15/2018 4:40 PM)  From Dr. Angelena Form   1. Atrial fibrillation, paroxysmal: No episodes of palpitations. She is in sinus today. Continue metoprolol, flecainide and coumadin.    2. Pre-operative cardiovascular examination: No signs of angina, CHF or uncontrolled arrhythmias. She can proceed her planned surgical procedure. She can hold coumadin 5 days before her planned procedure.  3. DM - diet controlled normal HgbA1C 4. Back surgery - 2012 - [facility] 5. She thinks that anesthesia messes up her mind 6.  Right ankle fx - 03/2018  Alphonsa Overall, MD, [facility] Pager: [phone] Office phone:  [phone]

## 2019-01-17 ENCOUNTER — Ambulatory Visit (HOSPITAL_COMMUNITY)
Admission: RE | Admit: 2019-01-17 | Discharge: 2019-01-17 | Disposition: A | Discharge status: Home / Self Care | Payer: Medicare Other | Attending: Surgery | Admitting: Surgery

## 2019-01-17 ENCOUNTER — Encounter (HOSPITAL_COMMUNITY): Payer: Self-pay

## 2019-01-17 ENCOUNTER — Encounter (HOSPITAL_COMMUNITY)
Admission: RE | Disposition: A | Discharge status: Home / Self Care | Payer: Self-pay | Source: Home / Self Care | Attending: Surgery

## 2019-01-17 ENCOUNTER — Ambulatory Visit (HOSPITAL_COMMUNITY): Payer: Medicare Other | Admitting: Certified Registered"

## 2019-01-17 ENCOUNTER — Other Ambulatory Visit: Payer: Self-pay

## 2019-01-17 ENCOUNTER — Ambulatory Visit (HOSPITAL_COMMUNITY): Payer: Medicare Other | Admitting: Physician Assistant

## 2019-01-17 DIAGNOSIS — Z170 Estrogen receptor positive status [ER+]: Secondary | ICD-10-CM | POA: Diagnosis not present

## 2019-01-17 DIAGNOSIS — I10 Essential (primary) hypertension: Secondary | ICD-10-CM | POA: Diagnosis not present

## 2019-01-17 DIAGNOSIS — Z79899 Other long term (current) drug therapy: Secondary | ICD-10-CM | POA: Insufficient documentation

## 2019-01-17 DIAGNOSIS — Z7901 Long term (current) use of anticoagulants: Secondary | ICD-10-CM | POA: Insufficient documentation

## 2019-01-17 DIAGNOSIS — E119 Type 2 diabetes mellitus without complications: Secondary | ICD-10-CM | POA: Insufficient documentation

## 2019-01-17 DIAGNOSIS — E7800 Pure hypercholesterolemia, unspecified: Secondary | ICD-10-CM | POA: Diagnosis not present

## 2019-01-17 DIAGNOSIS — C50812 Malignant neoplasm of overlapping sites of left female breast: Secondary | ICD-10-CM | POA: Diagnosis not present

## 2019-01-17 DIAGNOSIS — I480 Paroxysmal atrial fibrillation: Secondary | ICD-10-CM | POA: Insufficient documentation

## 2019-01-17 DIAGNOSIS — K219 Gastro-esophageal reflux disease without esophagitis: Secondary | ICD-10-CM | POA: Diagnosis not present

## 2019-01-17 DIAGNOSIS — F419 Anxiety disorder, unspecified: Secondary | ICD-10-CM | POA: Diagnosis not present

## 2019-01-17 HISTORY — PX: BREAST LUMPECTOMY WITH RADIOACTIVE SEED LOCALIZATION: SHX6424

## 2019-01-17 LAB — PROTIME-INR
INR: 1.2 (ref 0.8–1.2)
Prothrombin Time: 15.3 seconds — ABNORMAL HIGH (ref 11.4–15.2)

## 2019-01-17 LAB — GLUCOSE, CAPILLARY
Glucose-Capillary: 113 mg/dL — ABNORMAL HIGH (ref 70–99)
Glucose-Capillary: 137 mg/dL — ABNORMAL HIGH (ref 70–99)

## 2019-01-17 SURGERY — BREAST LUMPECTOMY WITH RADIOACTIVE SEED LOCALIZATION
Anesthesia: General | Site: Breast | Laterality: Left

## 2019-01-17 MED ORDER — MIDAZOLAM HCL 2 MG/2ML IJ SOLN
INTRAMUSCULAR | Status: AC
  Filled 2019-01-17: qty 2

## 2019-01-17 MED ORDER — LIDOCAINE 2% (20 MG/ML) 5 ML SYRINGE
INTRAMUSCULAR | Status: DC | PRN
  Administered 2019-01-17: 100 mg via INTRAVENOUS

## 2019-01-17 MED ORDER — ACETAMINOPHEN 500 MG PO TABS
ORAL_TABLET | ORAL | Status: AC
  Administered 2019-01-17: 1000 mg via ORAL
  Filled 2019-01-17: qty 2

## 2019-01-17 MED ORDER — CEFAZOLIN SODIUM-DEXTROSE 2-4 GM/100ML-% IV SOLN
INTRAVENOUS | Status: AC
  Filled 2019-01-17: qty 100

## 2019-01-17 MED ORDER — SODIUM CHLORIDE (PF) 0.9 % IJ SOLN
INTRAMUSCULAR | Status: AC
  Filled 2019-01-17: qty 10

## 2019-01-17 MED ORDER — ACETAMINOPHEN 500 MG PO TABS
1000.0000 mg | ORAL_TABLET | ORAL | Status: AC
  Administered 2019-01-17: 06:00:00 1000 mg via ORAL

## 2019-01-17 MED ORDER — CHLORHEXIDINE GLUCONATE CLOTH 2 % EX PADS: 6.0000 | MEDICATED_PAD | Freq: Once | CUTANEOUS | Status: DC

## 2019-01-17 MED ORDER — FENTANYL CITRATE (PF) 250 MCG/5ML IJ SOLN
INTRAMUSCULAR | Status: AC
  Filled 2019-01-17: qty 5

## 2019-01-17 MED ORDER — LACTATED RINGERS IV SOLN
INTRAVENOUS | Status: DC
  Administered 2019-01-17: 07:00:00 via INTRAVENOUS

## 2019-01-17 MED ORDER — FENTANYL CITRATE (PF) 100 MCG/2ML IJ SOLN
INTRAMUSCULAR | Status: DC | PRN
  Administered 2019-01-17: 50 ug via INTRAVENOUS
  Administered 2019-01-17: 25 ug via INTRAVENOUS

## 2019-01-17 MED ORDER — TRAMADOL HCL 50 MG PO TABS: 50.0000 mg | ORAL_TABLET | Freq: Four times a day (QID) | ORAL | 0 refills | Status: DC | PRN

## 2019-01-17 MED ORDER — 0.9 % SODIUM CHLORIDE (POUR BTL) OPTIME
TOPICAL | Status: DC | PRN
  Administered 2019-01-17: 08:00:00 1000 mL

## 2019-01-17 MED ORDER — ONDANSETRON HCL 4 MG/2ML IJ SOLN
INTRAMUSCULAR | Status: DC | PRN
  Administered 2019-01-17: 4 mg via INTRAVENOUS

## 2019-01-17 MED ORDER — MIDAZOLAM HCL 5 MG/5ML IJ SOLN
INTRAMUSCULAR | Status: DC | PRN
  Administered 2019-01-17: 2 mg via INTRAVENOUS

## 2019-01-17 MED ORDER — PROPOFOL 10 MG/ML IV BOLUS
INTRAVENOUS | Status: DC | PRN
  Administered 2019-01-17: 130 mg via INTRAVENOUS

## 2019-01-17 MED ORDER — STERILE WATER FOR IRRIGATION IR SOLN
Status: DC | PRN
  Administered 2019-01-17: 1000 mL

## 2019-01-17 MED ORDER — CEFAZOLIN SODIUM-DEXTROSE 2-3 GM-%(50ML) IV SOLR
INTRAVENOUS | Status: DC | PRN
  Administered 2019-01-17: 2 g via INTRAVENOUS

## 2019-01-17 MED ORDER — BUPIVACAINE-EPINEPHRINE 0.25% -1:200000 IJ SOLN
INTRAMUSCULAR | Status: DC | PRN
  Administered 2019-01-17: 30 mL

## 2019-01-17 MED ORDER — LIDOCAINE 2% (20 MG/ML) 5 ML SYRINGE
INTRAMUSCULAR | Status: AC
  Filled 2019-01-17: qty 5

## 2019-01-17 MED ORDER — BUPIVACAINE-EPINEPHRINE 0.25% -1:200000 IJ SOLN
INTRAMUSCULAR | Status: AC
  Filled 2019-01-17: qty 1

## 2019-01-17 MED ORDER — PROPOFOL 10 MG/ML IV BOLUS
INTRAVENOUS | Status: AC
  Filled 2019-01-17: qty 20

## 2019-01-17 MED ORDER — METHYLENE BLUE 0.5 % INJ SOLN
INTRAVENOUS | Status: AC
  Filled 2019-01-17: qty 10

## 2019-01-17 MED ORDER — DEXAMETHASONE SODIUM PHOSPHATE 10 MG/ML IJ SOLN
INTRAMUSCULAR | Status: DC | PRN
  Administered 2019-01-17: 5 mg via INTRAVENOUS

## 2019-01-17 SURGICAL SUPPLY — 36 items
BINDER BREAST LRG (GAUZE/BANDAGES/DRESSINGS) IMPLANT
BINDER BREAST XLRG (GAUZE/BANDAGES/DRESSINGS) IMPLANT
CANISTER SUCT 3000ML PPV (MISCELLANEOUS) IMPLANT
CHLORAPREP W/TINT 26 (MISCELLANEOUS) ×3 IMPLANT
CLIP VESOCCLUDE SM WIDE 6/CT (CLIP) ×3 IMPLANT
COVER PROBE W GEL 5X96 (DRAPES) ×3 IMPLANT
COVER SURGICAL LIGHT HANDLE (MISCELLANEOUS) ×3 IMPLANT
COVER WAND RF STERILE (DRAPES) IMPLANT
DECANTER SPIKE VIAL GLASS SM (MISCELLANEOUS) ×3 IMPLANT
DERMABOND ADVANCED (GAUZE/BANDAGES/DRESSINGS) ×2
DERMABOND ADVANCED .7 DNX12 (GAUZE/BANDAGES/DRESSINGS) ×1 IMPLANT
DEVICE DUBIN SPECIMEN MAMMOGRA (MISCELLANEOUS) ×3 IMPLANT
DRAPE CHEST BREAST 15X10 FENES (DRAPES) ×3 IMPLANT
DRSG PAD ABDOMINAL 8X10 ST (GAUZE/BANDAGES/DRESSINGS) ×3 IMPLANT
ELECT COATED BLADE 2.86 ST (ELECTRODE) ×3 IMPLANT
ELECT REM PT RETURN 9FT ADLT (ELECTROSURGICAL) ×3
ELECTRODE REM PT RTRN 9FT ADLT (ELECTROSURGICAL) ×1 IMPLANT
GAUZE SPONGE 4X4 12PLY STRL (GAUZE/BANDAGES/DRESSINGS) ×3 IMPLANT
GLOVE SURG SYN 7.5  E (GLOVE) ×2
GLOVE SURG SYN 7.5 E (GLOVE) ×1 IMPLANT
GOWN STRL REUS W/ TWL LRG LVL3 (GOWN DISPOSABLE) ×1 IMPLANT
GOWN STRL REUS W/ TWL XL LVL3 (GOWN DISPOSABLE) ×1 IMPLANT
GOWN STRL REUS W/TWL LRG LVL3 (GOWN DISPOSABLE) ×2
GOWN STRL REUS W/TWL XL LVL3 (GOWN DISPOSABLE) ×2
ILLUMINATOR WAVEGUIDE N/F (MISCELLANEOUS) IMPLANT
KIT BASIN OR (CUSTOM PROCEDURE TRAY) ×3 IMPLANT
KIT MARKER MARGIN INK (KITS) ×3 IMPLANT
LIGHT WAVEGUIDE WIDE FLAT (MISCELLANEOUS) IMPLANT
NEEDLE HYPO 25GX1X1/2 BEV (NEEDLE) ×3 IMPLANT
NS IRRIG 1000ML POUR BTL (IV SOLUTION) IMPLANT
PACK GENERAL/GYN (CUSTOM PROCEDURE TRAY) ×3 IMPLANT
SUT MNCRL AB 4-0 PS2 18 (SUTURE) ×3 IMPLANT
SUT VIC AB 3-0 SH 18 (SUTURE) ×3 IMPLANT
SYR CONTROL 10ML LL (SYRINGE) ×3 IMPLANT
TOWEL GREEN STERILE (TOWEL DISPOSABLE) ×3 IMPLANT
TOWEL GREEN STERILE FF (TOWEL DISPOSABLE) ×3 IMPLANT

## 2019-01-17 NOTE — Discharge Instructions (Signed)
[facility] [place] [facility] - DISCHARGE INSTRUCTIONS TO PATIENT  Activity:  Driving - May drive in 2 or 3 days   Lifting - No lifting more than 15 pounds for 5 days, then no limit                       Practice your Covid-19 protection:  Wear a mask, social distance, and wash your hands frequently  Wound Care:   Leave the incision dry for 2 days, then remove the bandages and you may shower.        You may reuse the breast binder or not.  About half the patients like the breast binder and about half do not.  Diet:  As tolerated  Follow up appointment:  Call Dr. Pollie Friar office [facility]) at [phone] for an appointment in 2 to 3 weeks..  Medications and dosages:  Resume your home medications.  You have a prescription for:  Ultram          Call Dr. Lucia Gaskins or his office  [phone]) if you have:  Temperature greater than 100.4,  Persistent nausea and vomiting,  Severe uncontrolled pain,  Redness, tenderness, or signs of infection (pain, swelling, redness, odor or green/yellow discharge around the site),  Difficulty breathing, headache or visual disturbances,  Any other questions or concerns you may have after discharge.  In an emergency, call 911 or go to an Emergency Department at a nearby hospital.

## 2019-01-17 NOTE — Anesthesia Procedure Notes (Signed)
Procedure Name: LMA Insertion Date/Time: 01/17/2019 7:37 AM Performed by: Moshe Salisbury, CRNA Pre-anesthesia Checklist: Patient identified, Emergency Drugs available, Suction available and Patient being monitored Patient Re-evaluated:Patient Re-evaluated prior to induction Oxygen Delivery Method: Circle System Utilized Preoxygenation: Pre-oxygenation with 100% oxygen Induction Type: IV induction Ventilation: Mask ventilation without difficulty LMA: LMA inserted LMA Size: 4.0 Number of attempts: 1 Placement Confirmation: positive ETCO2 Tube secured with: Tape Dental Injury: Teeth and Oropharynx as per pre-operative assessment

## 2019-01-17 NOTE — Anesthesia Postprocedure Evaluation (Signed)
Anesthesia Post Note  Patient: Jacqualine Mau  Procedure(s) Performed: LEFT BREAST LUMPECTOMY WITH RADIOACTIVE SEED LOCALIZATION (Left Breast)     Patient location during evaluation: PACU Anesthesia Type: General Level of consciousness: awake and alert Pain management: pain level controlled Vital Signs Assessment: post-procedure vital signs reviewed and stable Respiratory status: spontaneous breathing, nonlabored ventilation, respiratory function stable and patient connected to nasal cannula oxygen Cardiovascular status: blood pressure returned to baseline and stable Postop Assessment: no apparent nausea or vomiting Anesthetic complications: no    Last Vitals:  Vitals:   01/17/19 0611 01/17/19 0625  BP:  (!) 181/56  Pulse: 69   Resp: 18   Temp: 36.9 C     Last Pain:  Vitals:   01/17/19 0840  TempSrc:   PainSc: 0-No pain                 Daleon Willinger DAVID

## 2019-01-17 NOTE — Op Note (Signed)
01/17/2019  8:35 AM  PATIENT:  Emily Andrews DOB: 1934/11/21 MRN: [id number]  PREOP DIAGNOSIS:   LEFT BREAST CANCER  POSTOP DIAGNOSIS:    Left breast cancer, 3 o'clock position (T1, N0)  PROCEDURE:   Procedure(s): LEFT BREAST LUMPECTOMY WITH RADIOACTIVE SEED LOCALIZATION  SURGEON:   Alphonsa Overall, M.D.  ANESTHESIA:   General  Anesthesiologist: Lillia Abed, MD CRNA: Moshe Salisbury, CRNA  General  EBL:  Minimal  ml  DRAINS:  none   LOCAL MEDICATIONS USED:   30 cc 1/4% marcaine  SPECIMEN:   Left breast lumpectomy (6 color paint kit)  COUNTS CORRECT:  YES  INDICATIONS FOR PROCEDURE:  Emily Andrews is a 84 y.o. (DOB: [birth year]) white female whose primary care physician is Orpah Melter, MD and comes for left breast lumpectomy.   She has seen Drs. Philis Nettle for oncology.  The options for breast cancer treatment have been discussed with the patient. She elected to proceed with lumpectomy.   Because of her age and the early cancer, a lymph node biopsy is not necessary.    The indications and potential complications of surgery were explained to the patient. Potential complications include, but are not limited to, bleeding, infection, the need for further surgery, and nerve injury.     She had a I131 seed placed on 01/16/2019 in her left breast at [facility].  The seed is in the 3 o'clock position of the left breast.     OPERATIVE NOTE:   The patient was taken to operating room # 9 at [facility] where she underwent a general anesthesia  supervised by Anesthesiologist: Lillia Abed, MD CRNA: Moshe Salisbury, CRNA. Her left breast and axilla were prepped with  ChloraPrep and sterilely draped.    A time-out and the surgical check list was reviewed.    The cancer was about at the 3 o'clock position of the left breast.   It was 8 cm from the areola.  I used the Neoprobe to identify the I131 seed.  I made a radial incision, almost in her lower axilla and  tried  to excise an area around the tumor of at least 1 cm.    I excised this block of breast tissue approximately 4 cm by 4 cm  in diameter.   I painted the lumpectomy specimen with the 6 color paint kit and did a specimen mammogram which confirmed the mass, clip, and the seed were all in the right position in the specimen.  The specimen was sent to pathology who called back to confirm that they have the seed and the specimen.   I then irrigated the wound with saline. I infiltrated approximately 30 mL of 1/4% Marcaine in the incision. I placed 4 clips to mark biopsy cavity, at 12, 3, 6, and 9 o'clock.  I then closed the wound in layers using 3-0 Vicryl sutures for the deep layer. At the skin, I closed the incision with a 4-0 Monocryl suture. The incision was then painted with Dermabond.  She had gauze place over the wound and placed in a breast binder.   The patient tolerated the procedure well, was transported to the recovery room in good condition. Sponge and needle count were correct at the end of the case.   Final pathology is pending.   Alphonsa Overall, MD, [facility] Surgery Pager: [phone] Office phone:  [phone]

## 2019-01-17 NOTE — Interval H&P Note (Signed)
History and Physical Interval Note:  01/17/2019 7:25 AM  Emily Andrews  has presented today for surgery, with the diagnosis of LEFT BREAST CANCER.  The various methods of treatment have been discussed with the patient and family.  Her seed is in good position.  After consideration of risks, benefits and other options for treatment, the patient has consented to  Procedure(s): LEFT BREAST LUMPECTOMY WITH RADIOACTIVE SEED LOCALIZATION (Left) as a surgical intervention.  The patient's history has been reviewed, patient examined, no change in status, stable for surgery.  I have reviewed the patient's chart and labs.  Questions were answered to the patient's satisfaction.     Shann Medal

## 2019-01-17 NOTE — Transfer of Care (Signed)
Immediate Anesthesia Transfer of Care Note  Patient: Emily Andrews  Procedure(s) Performed: LEFT BREAST LUMPECTOMY WITH RADIOACTIVE SEED LOCALIZATION (Left Breast)  Patient Location: PACU  Anesthesia Type:General  Level of Consciousness: awake and patient cooperative  Airway & Oxygen Therapy: Patient Spontanous Breathing and Patient connected to nasal cannula oxygen  Post-op Assessment: Report given to RN, Post -op Vital signs reviewed and stable and Patient moving all extremities  Post vital signs: Reviewed and stable  Last Vitals:  Vitals Value Taken Time  BP 147/62 01/17/19 0841  Temp    Pulse 65 01/17/19 0842  Resp 31 01/17/19 0842  SpO2 100 % 01/17/19 0842  Vitals shown include unvalidated device data.  Last Pain:  Vitals:   01/17/19 0611  TempSrc: Oral  PainSc: 0-No pain      Patients Stated Pain Goal: 2 (67/12/45 8099)  Complications: No apparent anesthesia complications

## 2019-01-18 ENCOUNTER — Encounter (HOSPITAL_COMMUNITY): Payer: Self-pay | Admitting: Surgery

## 2019-01-18 ENCOUNTER — Telehealth: Payer: Self-pay | Admitting: Cardiovascular Disease

## 2019-01-18 LAB — SURGICAL PATHOLOGY

## 2019-01-18 NOTE — Telephone Encounter (Signed)
Patient called to report she was staring back up her coumadin again today, 5mg  she will be taking tonight.

## 2019-01-18 NOTE — Telephone Encounter (Signed)
Called pt back. She stated she was doing well. Instructed her  to continue following her dosing instructions from (01/11/2019) and told her not to eat as many greens until her INR becomes therapeutic. Informed pt that we would look forward to seeing her on 10/22 at her INR check.

## 2019-01-22 ENCOUNTER — Telehealth: Payer: Self-pay | Admitting: Cardiovascular Disease

## 2019-01-22 NOTE — Telephone Encounter (Signed)
New Message:    Pt is coming in for her Coumadin tomorrow. She says she nee her husband to come in with her for the appt. Pt said she just had breast surgery and need  Help getting around and up and down.

## 2019-01-24 ENCOUNTER — Other Ambulatory Visit: Payer: Self-pay

## 2019-01-24 ENCOUNTER — Ambulatory Visit: Payer: Medicare Other

## 2019-01-24 DIAGNOSIS — I4891 Unspecified atrial fibrillation: Secondary | ICD-10-CM

## 2019-01-24 DIAGNOSIS — Z7901 Long term (current) use of anticoagulants: Secondary | ICD-10-CM | POA: Diagnosis not present

## 2019-01-24 DIAGNOSIS — Z5181 Encounter for therapeutic drug level monitoring: Secondary | ICD-10-CM

## 2019-01-24 LAB — POCT INR: INR: 1.6 — AB (ref 2.0–3.0)

## 2019-01-24 NOTE — Patient Instructions (Signed)
Description   Take 1.5 tablets today, then resume same dosage 1 tablet daily except 1/2 tablet each Tuesdays, Thursdays, and Saturdays.  Recheck in 2 weeks. Call if scheduled for any surgery or any new medications [phone] Coumadin Clinic.

## 2019-01-28 ENCOUNTER — Encounter: Payer: Self-pay | Admitting: *Deleted

## 2019-01-30 NOTE — Progress Notes (Signed)
Emily Andrews   [phone] [phone]   Clinic Follow up Note   Patient Care Team: Orpah Melter, MD as PCP - General (Family Medicine) Burnell Blanks, MD as PCP - Cardiology (Cardiology) Romeo Apple, MD (Inactive) as Referring Physician (Cardiology) Alphonsa Overall, MD as Consulting Physician (General Surgery) Eppie Gibson, MD as Consulting Physician (Radiation Oncology) Truitt Merle, MD as Consulting Physician (Hematology)  Date of Service:  02/04/2019  CHIEF COMPLAINT: F/u of left breast cancer   SUMMARY OF ONCOLOGIC HISTORY: Oncology History  Carcinoma of upper-outer quadrant of left breast in female, estrogen receptor positive ([place])  10/08/2018 Mammogram   Mammogram 10/08/18 0.5cm x 0.5cm x 0.6 cm taller than wide irregular mass in the left breast at 1:00 position 10cm from nipple highly suggestive of malignancy.      10/29/2018 Initial Biopsy   Diagnosis 10/29/18 Breast, left, needle core biopsy, 1 o'clock, 10cmfn - INVASIVE DUCTAL CARCINOMA, SEE COMMENT. - DUCTAL CARCINOMA IN SITU.   10/29/2018 Receptors her2   Results: IMMUNOHISTOCHEMICAL AND MORPHOMETRIC ANALYSIS PERFORMED MANUALLY The tumor cells are NEGATIVE for Her2 (1+). Estrogen Receptor: 100%, POSITIVE, STRONG STAINING INTENSITY Progesterone Receptor: 100%, POSITIVE, STRONG STAINING INTENSITY Proliferation Marker Ki67: 10%   11/26/2018 Cancer Staging   Staging form: Breast, AJCC 8th Edition - Clinical stage from 11/26/2018: Stage IA (cT1b, cN0, cM0, G1, ER+, PR+, HER2-) - Signed by Eppie Gibson, MD on 11/27/2018   11/27/2018 Initial Diagnosis   Carcinoma of upper-outer quadrant of left breast in female, estrogen receptor positive ([place])   01/17/2019 Surgery   LEFT BREAST LUMPECTOMY WITH RADIOACTIVE SEED LOCALIZATION by Dr Lucia Gaskins  01/17/19    01/17/2019 Pathology Results   DIAGNOSIS: 01/17/19  A. BREAST, LEFT, LUMPECTOMY:  - Invasive ductal carcinoma, grade 1,  spanning 0.7 cm.  - Low-grade ductal carcinoma in situ.  - Lobular neoplasia (atypical lobular hyperplasia).  - Invasive carcinoma is 5 mm in the anterior margin focally.  - Resection margins are negative for in situ carcinoma.  - Biopsy site.  - See oncology table.    03/05/2019 -  Anti-estrogen oral therapy   Tamoxifen 11m daily starting 03/05/2019      CURRENT THERAPY:  Tamoxifen 255mdaily starting 03/05/2019  INTERVAL HISTORY:  Emily Andrews here for a follow up of left breast cancer. She presents to the clinic alone. She notes she is doing well. She notes her surgery went well and did not need to stay overnight. She notes she did f/u with Dr. NeLucia Gaskinsfterward. She notes she ambulated alone at home. She does use walker outside of house when walking long distances. She notes she has been checking her BG 1-2  Times a day and in is 100 or less range.      REVIEW OF SYSTEMS:   Constitutional: Denies fevers, chills or abnormal weight loss Eyes: Denies blurriness of vision Ears, nose, mouth, throat, and face: Denies mucositis or sore throat Respiratory: Denies cough, dyspnea or wheezes Cardiovascular: Denies palpitation, chest discomfort or lower extremity swelling Gastrointestinal:  Denies nausea, heartburn or change in bowel habits Skin: Denies abnormal skin rashes Lymphatics: Denies new lymphadenopathy or easy bruising Neurological:Denies numbness, tingling or new weaknesses Behavioral/Psych: Mood is stable, no new changes  All other systems were reviewed with the patient and are negative.  MEDICAL HISTORY:  Past Medical History:  Diagnosis Date  . Anxiety   . Arthritis    a. R>L knees  [facility]   [facility] Cancer  .  Diabetes mellitus   . Dysrhythmia   . Environmental allergies   . Hiatal hernia   . Hyperlipidemia    a. statin intolerant - myalgias  . Hypertension   . Paroxysmal atrial fibrillation ([place])    a. Dx in 2004.  Off coumadin x  several yrs;  b. Normal Event Monitor 03/2011.    SURGICAL HISTORY: Past Surgical History:  Procedure Laterality Date  . ABDOMINAL HYSTERECTOMY    . BREAST LUMPECTOMY WITH RADIOACTIVE SEED LOCALIZATION Left 01/17/2019   Procedure: LEFT BREAST LUMPECTOMY WITH RADIOACTIVE SEED LOCALIZATION;  Surgeon: Alphonsa Overall, MD;  Location: [place];  Service: General;  Laterality: Left;  . CHOLECYSTECTOMY    . CYSTECTOMY     lower back  . lipoma tumor removed     right shoulder  . ORIF ANKLE FRACTURE Right 03/23/2018   Procedure: OPEN REDUCTION INTERNAL FIXATION (ORIF) ANKLE FRACTURE;  Surgeon: Nicholes Stairs, MD;  Location: [facility];  Service: Orthopedics;  Laterality: Right;    I have reviewed the social history and family history with the patient and they are unchanged from previous note.  ALLERGIES:  is allergic to statins; oxycodone-acetaminophen; poly hist forte [doxylamine-phenylephrine]; adhesive [tape]; celebrex [celecoxib]; clindamycin/lincomycin; penicillins; phenergan [promethazine hcl]; and robaxin [methocarbamol].  MEDICATIONS:  Current Outpatient Medications  Medication Sig Dispense Refill  . Calcium Carbonate-Vitamin D (CALTRATE 600+D PO) Take 2 tablets by mouth daily.     . chlorthalidone (HYGROTON) 25 MG tablet Take 1 tablet (25 mg total) by mouth daily. 90 tablet 3  . cyanocobalamin 100 MCG tablet Take 100 mcg by mouth daily.     Marland Kitchen ezetimibe (ZETIA) 10 MG tablet Take 10 mg by mouth every evening.   1  . flecainide (TAMBOCOR) 50 MG tablet Take 1.5 tablets (75 mg total) by mouth 2 (two) times daily. 270 tablet 3  . fluticasone (FLONASE) 50 MCG/ACT nasal spray Place 2 sprays into both nostrils daily as needed for allergies.     Marland Kitchen losartan (COZAAR) 100 MG tablet Take 100 mg by mouth daily.    . Magnesium 250 MG TABS Take 250 mg by mouth 2 (two) times daily.   0  . metoprolol tartrate (LOPRESSOR) 25 MG tablet Take 25 mg by mouth 2 (two) times daily.     . Multiple  Vitamins-Minerals (CENTRUM PO) Take 1 tablet by mouth daily.      . Omega-3 Fatty Acids (FISH OIL) 1200 MG CAPS Take 2,400 mg by mouth daily.     Marland Kitchen omeprazole (PRILOSEC) 20 MG capsule Take 1 capsule (20 mg total) by mouth daily.    . potassium chloride SA (K-DUR) 20 MEQ tablet Take 1 tablet (20 mEq total) by mouth daily. 90 tablet 3  . traMADol (ULTRAM) 50 MG tablet Take 1 tablet (50 mg total) by mouth every 6 (six) hours as needed for moderate pain or severe pain. 10 tablet 0  . warfarin (COUMADIN) 5 MG tablet TAKE BY MOUTH AS DIRECTED BY ANTICOAGULATION CLINIC (Patient taking differently: Take 2.5-5 mg by mouth. Take 2.5 mg by mouth on Tuesday, Thursday and Saturday, take 5 mg on Monday, Wednesday, Friday and Sunday) 100 tablet 1  . tamoxifen (NOLVADEX) 20 MG tablet Take 1 tablet (20 mg total) by mouth daily. 30 tablet 5   No current facility-administered medications for this visit.     PHYSICAL EXAMINATION: ECOG PERFORMANCE STATUS: 3 - Symptomatic, >50% confined to bed  Vitals:   02/04/19 0907  BP: 138/64  Pulse: 60  Resp: 17  Temp: 98 F (36.7 C)  SpO2: 99%   Filed Weights   02/04/19 0907  Weight: 165 lb 8 oz (75.1 kg)    GENERAL:alert, no distress and comfortable SKIN: skin color, texture, turgor are normal, no rashes or significant lesions EYES: normal, Conjunctiva are pink and non-injected, sclera clear  NECK: supple, thyroid normal size, non-tender, without nodularity LYMPH:  no palpable lymphadenopathy in the cervical, axillary  LUNGS: clear to auscultation and percussion with normal breathing effort HEART: regular rate & rhythm and no murmurs and no lower extremity edema ABDOMEN:abdomen soft, non-tender and normal bowel sounds Musculoskeletal:no cyanosis of digits and no clubbing  NEURO: alert & oriented x 3 with fluent speech, no focal motor/sensory deficits BREAST: S/p left lumpectomy: surgical incision healing very well with small 1.5x2cm lump at mediolateral  incision in LUQ, likely seroma. No palpable adenopathy bilaterally. Breast exam benign.   LABORATORY DATA:  I have reviewed the data as listed CBC Latest Ref Rng & Units 01/11/2019 11/30/2018 03/23/2018  WBC 4.0 - 10.5 K/uL 7.6 6.7 8.7  Hemoglobin 12.0 - 15.0 g/dL 14.2 13.9 13.6  Hematocrit 36.0 - 46.0 % 43.5 42.4 41.9  Platelets 150 - 400 K/uL 236 230 336     CMP Latest Ref Rng & Units 01/11/2019 11/30/2018 03/23/2018  Glucose 70 - 99 mg/dL 117(H) 92 125(H)  BUN 8 - 23 mg/dL _0 Creatinine 0.44 - 1.00 mg/dL 0.87 0.89 0.68  Sodium 135 - 145 mmol/L 138 140 137  Potassium 3.5 - 5.1 mmol/L 4.1 4.0 3.5  Chloride 98 - 111 mmol/L 104 103 100  CO2 22 - 32 mmol/L _1 Calcium 8.9 - 10.3 mg/dL 9.4 9.5 9.5  Total Protein 6.5 - 8.1 g/dL - 6.9 -  Total Bilirubin 0.3 - 1.2 mg/dL - 0.8 -  Alkaline Phos 38 - 126 U/L - 67 -  AST 15 - 41 U/L - 20 -  ALT 0 - 44 U/L - 13 -      RADIOGRAPHIC STUDIES: I have personally reviewed the radiological images as listed and agreed with the findings in the report. No results found.   ASSESSMENT & PLAN:  Emily Andrews is a 84 y.o. female with   1. Carcinoma of upper-outer quadrant of left breast, Stage IA, c(T1bN0M0), ER/PR+, HER2-, Grade I -she was diagnosed in 10/2018. She underwent left lumpectomy on 01/17/19. We discussed her pathology which showed complete resection of grade I invasive ductal carcinoma in situ, margins were negative.  -On exam today her breast incision is healing well with a small lump at incision site, likely a seroma from surgery. She has been recovering well from surgery. I encouraged her to watch it, she can follow up with Dr. Lucia Gaskins about it if worsens.  -Give her small early stage breast cancer and her age, Dr. Isidore Moos thinks her benefit of adjuvant radiation is low so she will not proceed with or radiation.  -To reduce her risk of distant cancer recurrence and given the strong ER/PR expression, I recommend   adjuvant endocrine therapy with aromatase inhibitor or Tamoxifen for 5 years. Given her arthritis and previous hysterectomy, tamoxifen may be a better option for her. She is interested.   ----The potential side effects, which includes but not limited to, hot flash, skin and vaginal dryness, slightly increased risk of cardiovascular disease and cataract, small risk of thrombosis and endometrial cancer, were discussed with her in great details. Preventive strategies for thrombosis, such as being physically  active, using compression stocks, avoid cigarette smoking, etc., were reviewed with her.  She has had hysterectomy, no risk for endometrial cancer from tamoxifen. She voiced good understanding, and agrees to proceed.  -Will watch her arthritis, A1c, cholesterol her weight and watch for blood clots. I have low threshold of stopping antiestrogen therapy if she cannot tolerate.  -I will call in Tamoxifen today for her to start at the beginning of December.  -F/u in 4 months    2. Bone health, Arthritis   -She notes not having a full body DEXA scan which was normal per pt several years ago at [place].  -I recommend a new baseline, she is agreeable.  -She has arthritis of her b/l legs, she will start Tamoxifen next month.     3. Prior Fall and right ankle fracture  -She fell while trying to get in into a truck.  This resulted in a fractured right ankle  -She has had right ankle surgery in early 2020.  -She ambulates moderately well now and will use wheelchair as needed.   4. Afib, HLD, DM -No on medication for DM. Well controlled  -On Zetia, Warfarin, metoprolol and losartan. Managed by her cardiologist and PCP   5. Anxiety and claustrophobia  -not on medication    PLAN:  -Flu shot today -I called in Tamoxifen today to start at the beginning of December  -DEXA scan in 1 month at [place] and f/u in 4 months  -I called her daughter Lattie Haw during her visit     No  problem-specific Assessment & Plan notes found for this encounter.   Orders Placed This Encounter  Procedures  . DG Bone Density    Standing Status:   Future    Standing Expiration Date:   02/04/2020    Scheduling Instructions:     [place]    Order Specific Question:   Reason for Exam (SYMPTOM  OR DIAGNOSIS REQUIRED)    Answer:   screening    Order Specific Question:   Preferred imaging location?    Answer:   External   All questions were answered. The patient knows to call the clinic with any problems, questions or concerns. No barriers to learning was detected. I spent 20 minutes counseling the patient face to face. The total time spent in the appointment was 25 minutes and more than 50% was on counseling and review of test results     Truitt Merle, MD 02/04/2019   I, Joslyn Devon, am acting as scribe for Truitt Merle, MD.   I have reviewed the above documentation for accuracy and completeness, and I agree with the above.

## 2019-01-31 ENCOUNTER — Ambulatory Visit: Payer: Medicare Other | Admitting: Hematology

## 2019-02-04 ENCOUNTER — Telehealth: Payer: Self-pay | Admitting: Hematology

## 2019-02-04 ENCOUNTER — Inpatient Hospital Stay: Payer: Medicare Other | Attending: Hematology | Admitting: Hematology

## 2019-02-04 ENCOUNTER — Encounter: Payer: Self-pay | Admitting: Hematology

## 2019-02-04 ENCOUNTER — Other Ambulatory Visit: Payer: Self-pay

## 2019-02-04 VITALS — BP 138/64 | HR 60 | Temp 98.0°F | Resp 17 | Ht 64.0 in | Wt 165.5 lb

## 2019-02-04 DIAGNOSIS — C50412 Malignant neoplasm of upper-outer quadrant of left female breast: Secondary | ICD-10-CM | POA: Diagnosis not present

## 2019-02-04 DIAGNOSIS — Z23 Encounter for immunization: Secondary | ICD-10-CM | POA: Diagnosis not present

## 2019-02-04 DIAGNOSIS — Z170 Estrogen receptor positive status [ER+]: Secondary | ICD-10-CM

## 2019-02-04 MED ORDER — TAMOXIFEN CITRATE 20 MG PO TABS: 20.0000 mg | ORAL_TABLET | Freq: Every day | ORAL | 5 refills | Status: AC

## 2019-02-04 MED ORDER — INFLUENZA VAC A&B SA ADJ QUAD 0.5 ML IM PRSY
0.5000 mL | PREFILLED_SYRINGE | Freq: Once | INTRAMUSCULAR | Status: AC
  Administered 2019-02-04: 10:00:00 0.5 mL via INTRAMUSCULAR

## 2019-02-04 MED ORDER — INFLUENZA VAC A&B SA ADJ QUAD 0.5 ML IM PRSY
PREFILLED_SYRINGE | INTRAMUSCULAR | Status: AC
  Filled 2019-02-04: qty 0.5

## 2019-02-04 NOTE — Telephone Encounter (Signed)
Scheduled appt per 11/2 los.Marland Kitchen Spoke with pt and she is aware of her appt date and time.

## 2019-02-06 ENCOUNTER — Ambulatory Visit: Payer: Medicare Other | Admitting: *Deleted

## 2019-02-06 ENCOUNTER — Other Ambulatory Visit: Payer: Self-pay

## 2019-02-06 DIAGNOSIS — I4891 Unspecified atrial fibrillation: Secondary | ICD-10-CM | POA: Diagnosis not present

## 2019-02-06 DIAGNOSIS — Z5181 Encounter for therapeutic drug level monitoring: Secondary | ICD-10-CM | POA: Diagnosis not present

## 2019-02-06 DIAGNOSIS — Z7901 Long term (current) use of anticoagulants: Secondary | ICD-10-CM | POA: Diagnosis not present

## 2019-02-06 LAB — POCT INR: INR: 2.5 (ref 2.0–3.0)

## 2019-02-06 NOTE — Patient Instructions (Signed)
Description   Continue taking 1 tablet daily except 1/2 tablet each Tuesdays, Thursdays, and Saturdays.  Recheck in 4 weeks. Call if scheduled for any surgery or any new medications [phone] Coumadin Clinic.

## 2019-02-07 ENCOUNTER — Encounter: Payer: Self-pay | Admitting: *Deleted

## 2019-02-12 ENCOUNTER — Telehealth: Payer: Self-pay | Admitting: *Deleted

## 2019-02-12 NOTE — Telephone Encounter (Signed)
[name], please let them know it's OK to refill, and let pt know that her coumadin dose may need to be adjusted because of Temoxifen, and she needs more frequent INR check. Thanks   Truitt Merle MD

## 2019-02-12 NOTE — Telephone Encounter (Signed)
Cathy from [facility] states there is a level 2 interaction with Warfarin and Tamoxifen, can increase risk of bleeding.   Needs OK to fill tamoxifen from Dr Burr Medico.  Can be reached at [phone]

## 2019-02-13 ENCOUNTER — Telehealth: Payer: Self-pay

## 2019-02-13 ENCOUNTER — Other Ambulatory Visit: Payer: Self-pay

## 2019-02-13 ENCOUNTER — Telehealth: Payer: Self-pay | Admitting: Hematology

## 2019-02-13 DIAGNOSIS — C50412 Malignant neoplasm of upper-outer quadrant of left female breast: Secondary | ICD-10-CM

## 2019-02-13 NOTE — Telephone Encounter (Signed)
Scheduled appt per 11/11 sch message - pt is aware of appt date and time.   

## 2019-02-13 NOTE — Telephone Encounter (Signed)
Spoke with Tye Maryland at [facility] regarding level 2 drug interaction with Tamoxifen and Coumadin.  Per Dr. Burr Medico okay to fill Tamoxifen.  We will be monitoring the patient's INR every 2 weeks for at least 2 months and may have to adjust her Coumadin dose.  She states she will let the patient know.  I explained someone with scheduling will call her with her appointments for lab work.  She also states the patient is not planning on starting Tamoxifen until Thanksgiving.

## 2019-02-22 ENCOUNTER — Telehealth: Payer: Self-pay | Admitting: Pharmacist

## 2019-02-22 NOTE — Telephone Encounter (Signed)
Patient called stating she was starting macrobid. Advised no interaction with warfarin. Will start tamoxifen on 11/30.

## 2019-03-06 ENCOUNTER — Ambulatory Visit (INDEPENDENT_AMBULATORY_CARE_PROVIDER_SITE_OTHER): Payer: Medicare Other

## 2019-03-06 ENCOUNTER — Other Ambulatory Visit: Payer: Self-pay

## 2019-03-06 DIAGNOSIS — Z7901 Long term (current) use of anticoagulants: Secondary | ICD-10-CM | POA: Diagnosis not present

## 2019-03-06 DIAGNOSIS — Z5181 Encounter for therapeutic drug level monitoring: Secondary | ICD-10-CM

## 2019-03-06 DIAGNOSIS — I4891 Unspecified atrial fibrillation: Secondary | ICD-10-CM | POA: Diagnosis not present

## 2019-03-06 LAB — POCT INR: INR: 2.4 (ref 2.0–3.0)

## 2019-03-06 NOTE — Patient Instructions (Signed)
Description   Continue taking 1 tablet daily except 1/2 tablet each Tuesdays, Thursdays, and Saturdays.  Recheck in 4 weeks. Call if scheduled for any surgery or any new medications [phone] Coumadin Clinic.

## 2019-03-18 ENCOUNTER — Telehealth: Payer: Self-pay

## 2019-03-18 NOTE — Telephone Encounter (Signed)
Left message for patient's daughter [name] regarding bone density test results.  Per Dr. Burr Medico it shows osteopenia close to osteoporosis.  She has started taking Tamoxifen which helps.  This does put her at a higher risk for bone fracture.  Encouraged her to call back if she has any questions.

## 2019-03-20 ENCOUNTER — Telehealth: Payer: Self-pay | Admitting: Hematology

## 2019-03-20 ENCOUNTER — Inpatient Hospital Stay: Payer: Medicare Other

## 2019-03-20 NOTE — Telephone Encounter (Signed)
Pt is aware of cancellation

## 2019-03-21 ENCOUNTER — Other Ambulatory Visit: Payer: Medicare Other

## 2019-03-21 ENCOUNTER — Telehealth: Payer: Self-pay | Admitting: *Deleted

## 2019-03-21 NOTE — Telephone Encounter (Signed)
Pt is due for an appt since taking Tamoxifen and it can cause the INR to go up. However, pt is pending COVID 19 results so we cannot set an appt at this time.  Also, pt stated she will start Cipro 250mg  TID for 3 days for a UTI and will not pick up antibiotic until tomorrow. She is aware that she may have to increase her green leafy intake while taking the meds and once she picks up the med and get her Covid 19 results we will discuss more in detail.   Pt will call us tomorrow with COVID 19 results so we can set her next appointment.

## 2019-03-22 NOTE — Telephone Encounter (Signed)
Pt called into clinic, states she never received her Covid results from test yesterday as was told she would.  Called testing site and they advised her they "lost" her test and she needed to come back there today to be retested.  Pt is going back to be tested this morning and was told they would call and give her the results this afternoon.  Pt instructed to call us back with those results once she has received them so we can set her up an appt to get her INR checked ASAP.  Pt states she will call us back either this afternoon or Monday if she get the results after 5pm. Will await pt's call back.

## 2019-03-22 NOTE — Telephone Encounter (Signed)
Pt called clinic - states she was tested for COVID today and was positive. Advised pt we will need to move back her appt with [place] at least 2 weeks. She states she is asymptomatic. Advised her to eat extra greens for the 3 days she takes cipro. R/s INR check on Monday 1/4 (17 days after dx).

## 2019-03-29 ENCOUNTER — Emergency Department (HOSPITAL_COMMUNITY)
Admission: EM | Admit: 2019-03-29 | Discharge: 2019-03-29 | Disposition: A | Discharge status: Home / Self Care | Payer: Medicare Other | Attending: Emergency Medicine | Admitting: Emergency Medicine

## 2019-03-29 ENCOUNTER — Other Ambulatory Visit: Payer: Self-pay

## 2019-03-29 ENCOUNTER — Emergency Department (HOSPITAL_COMMUNITY): Payer: Medicare Other

## 2019-03-29 ENCOUNTER — Encounter (HOSPITAL_COMMUNITY): Payer: Self-pay | Admitting: Emergency Medicine

## 2019-03-29 DIAGNOSIS — Y998 Other external cause status: Secondary | ICD-10-CM | POA: Insufficient documentation

## 2019-03-29 DIAGNOSIS — Y9389 Activity, other specified: Secondary | ICD-10-CM | POA: Diagnosis not present

## 2019-03-29 DIAGNOSIS — E860 Dehydration: Secondary | ICD-10-CM | POA: Diagnosis not present

## 2019-03-29 DIAGNOSIS — Z79899 Other long term (current) drug therapy: Secondary | ICD-10-CM | POA: Diagnosis not present

## 2019-03-29 DIAGNOSIS — W07XXXA Fall from chair, initial encounter: Secondary | ICD-10-CM | POA: Diagnosis not present

## 2019-03-29 DIAGNOSIS — W19XXXA Unspecified fall, initial encounter: Secondary | ICD-10-CM

## 2019-03-29 DIAGNOSIS — I10 Essential (primary) hypertension: Secondary | ICD-10-CM | POA: Diagnosis not present

## 2019-03-29 DIAGNOSIS — U071 COVID-19: Secondary | ICD-10-CM | POA: Diagnosis not present

## 2019-03-29 DIAGNOSIS — Z853 Personal history of malignant neoplasm of breast: Secondary | ICD-10-CM | POA: Diagnosis not present

## 2019-03-29 DIAGNOSIS — S0990XA Unspecified injury of head, initial encounter: Secondary | ICD-10-CM | POA: Diagnosis not present

## 2019-03-29 DIAGNOSIS — S161XXA Strain of muscle, fascia and tendon at neck level, initial encounter: Secondary | ICD-10-CM | POA: Diagnosis not present

## 2019-03-29 DIAGNOSIS — Z7901 Long term (current) use of anticoagulants: Secondary | ICD-10-CM | POA: Diagnosis not present

## 2019-03-29 DIAGNOSIS — Y92009 Unspecified place in unspecified non-institutional (private) residence as the place of occurrence of the external cause: Secondary | ICD-10-CM

## 2019-03-29 DIAGNOSIS — Y92018 Other place in single-family (private) house as the place of occurrence of the external cause: Secondary | ICD-10-CM | POA: Insufficient documentation

## 2019-03-29 DIAGNOSIS — E119 Type 2 diabetes mellitus without complications: Secondary | ICD-10-CM | POA: Diagnosis not present

## 2019-03-29 LAB — BASIC METABOLIC PANEL
Anion gap: 11 (ref 5–15)
BUN: 35 mg/dL — ABNORMAL HIGH (ref 8–23)
CO2: 23 mmol/L (ref 22–32)
Calcium: 8.7 mg/dL — ABNORMAL LOW (ref 8.9–10.3)
Chloride: 96 mmol/L — ABNORMAL LOW (ref 98–111)
Creatinine, Ser: 1.35 mg/dL — ABNORMAL HIGH (ref 0.44–1.00)
GFR calc Af Amer: 42 mL/min — ABNORMAL LOW (ref >60)
GFR calc non Af Amer: 36 mL/min — ABNORMAL LOW (ref >60)
Glucose, Bld: 97 mg/dL (ref 70–99)
Potassium: 4.1 mmol/L (ref 3.5–5.1)
Sodium: 130 mmol/L — ABNORMAL LOW (ref 135–145)

## 2019-03-29 LAB — CBC WITH DIFFERENTIAL/PLATELET
Abs Immature Granulocytes: 0.02 10*3/uL (ref 0.00–0.07)
Basophils Absolute: 0 10*3/uL (ref 0.0–0.1)
Basophils Relative: 0 %
Eosinophils Absolute: 0 10*3/uL (ref 0.0–0.5)
Eosinophils Relative: 0 %
HCT: 40 % (ref 36.0–46.0)
Hemoglobin: 13.8 g/dL (ref 12.0–15.0)
Immature Granulocytes: 0 %
Lymphocytes Relative: 18 %
Lymphs Abs: 0.9 10*3/uL (ref 0.7–4.0)
MCH: 31.2 pg (ref 26.0–34.0)
MCHC: 34.5 g/dL (ref 30.0–36.0)
MCV: 90.3 fL (ref 80.0–100.0)
Monocytes Absolute: 0.5 10*3/uL (ref 0.1–1.0)
Monocytes Relative: 9 %
Neutro Abs: 3.6 10*3/uL (ref 1.7–7.7)
Neutrophils Relative %: 73 %
Platelets: 148 10*3/uL — ABNORMAL LOW (ref 150–400)
RBC: 4.43 MIL/uL (ref 3.87–5.11)
RDW: 13 % (ref 11.5–15.5)
WBC: 5.1 10*3/uL (ref 4.0–10.5)
nRBC: 0 % (ref 0.0–0.2)

## 2019-03-29 MED ORDER — SODIUM CHLORIDE 0.9 % IV BOLUS
500.0000 mL | Freq: Once | INTRAVENOUS | Status: AC
  Administered 2019-03-29: 500 mL via INTRAVENOUS

## 2019-03-29 NOTE — Discharge Instructions (Addendum)
Continue your home medications as previously prescribed. Return to the ED for worsening symptoms, chest pain, shortness of breath, vomiting or coughing up blood, leg swelling.

## 2019-03-29 NOTE — ED Triage Notes (Signed)
Pt arrived via [facility] from home. Per EMS pt fell from chair after near syncope event. Per pt family she was Covid + 12/18 and is on last day of quarantine. Pt stuck right side of head on floor and has minor laceration behind right head. Pt is on daily coumadin. Pt is c/o weakness since Covid result. Left arm restricted due to previous breast cancer.

## 2019-03-29 NOTE — ED Provider Notes (Addendum)
[place] EMERGENCY DEPARTMENT Provider Note   CSN: [id number] Arrival date & time: 03/29/19  1924     History Chief Complaint  Patient presents with  . Fall    Emily Andrews is a 84 y.o. female with a past medical history of PAF, hypertension, hyperlipidemia, diabetes, recently diagnosed breast cancer status post lumpectomy in August, positive COVID-19 test on 03/22/2019 presents to the ED for fall that occurred prior to arrival.  She was sitting in a chair at home trying to get her medications in order.  She suddenly turned her head to the right and " next thing I knew I hit the floor."  She does not believe that she lost consciousness but her husband was concerned that she did as he witnessed this event.  She states that if she did lose consciousness, it must of been for just a few seconds."  She was able to stand and she ambulates with a walker at baseline.  States that her only pain is behind her right ear where she believes she sustained a laceration.  She denies any previous or current headache, vision changes, chest pain, shortness of breath, numbness in arms or legs, cough, fever, abdominal pain.   HPI     Past Medical History:  Diagnosis Date  . Anxiety   . Arthritis    a. R>L knees  . Cancer [facility])    Left Breast Cancer  . Diabetes mellitus   . Dysrhythmia   . Environmental allergies   . Hiatal hernia   . Hyperlipidemia    a. statin intolerant - myalgias  . Hypertension   . Paroxysmal atrial fibrillation ([place])    a. Dx in 2004.  Off coumadin x several yrs;  b. Normal Event Monitor 03/2011.    Patient Active Problem List   Diagnosis Date Noted  . Carcinoma of upper-outer quadrant of left breast in female, estrogen receptor positive ([place]) 11/27/2018  . Closed displaced trimalleolar fracture of right ankle 03/23/2018  . Encounter for therapeutic drug monitoring 05/20/2013  . Long term (current) use of anticoagulants 07/27/2011  .  Atrial fibrillation with rapid ventricular response ([place]) 06/27/2011  . Elevated LFTs 02/16/2011  . Hypertension 08/18/2010  . Atrial fibrillation ([place]) 08/18/2010  . Hypercholesterolemia 08/18/2010  . Degenerative arthritis of cervical spine 08/18/2010    Past Surgical History:  Procedure Laterality Date  . ABDOMINAL HYSTERECTOMY    . BREAST LUMPECTOMY WITH RADIOACTIVE SEED LOCALIZATION Left 01/17/2019   Procedure: LEFT BREAST LUMPECTOMY WITH RADIOACTIVE SEED LOCALIZATION;  Surgeon: Alphonsa Overall, MD;  Location: [facility];  Service: General;  Laterality: Left;  . CHOLECYSTECTOMY    . CYSTECTOMY     lower back  . lipoma tumor removed     right shoulder  . ORIF ANKLE FRACTURE Right 03/23/2018   Procedure: OPEN REDUCTION INTERNAL FIXATION (ORIF) ANKLE FRACTURE;  Surgeon: Nicholes Stairs, MD;  Location: [facility];  Service: Orthopedics;  Laterality: Right;     OB History   No obstetric history on file.     Family History  Problem Relation Age of Onset  . Liver disease Mother        cirrhosis - died in 29's  . Heart failure Mother   . Stroke Father        died in 97's  . Coronary artery disease Sister        living  . Coronary artery disease Brother        living  Social History   Tobacco Use  . Smoking status: Never Smoker  . Smokeless tobacco: Never Used  Substance Use Topics  . Alcohol use: No  . Drug use: No    Home Medications Prior to Admission medications   Medication Sig Start Date End Date Taking? Authorizing Provider  Calcium Carbonate-Vitamin D (CALTRATE 600+D PO) Take 2 tablets by mouth daily.     [provider]  chlorthalidone (HYGROTON) 25 MG tablet Take 1 tablet (25 mg total) by mouth daily. 08/07/14   Larey Dresser, MD  cyanocobalamin 100 MCG tablet Take 100 mcg by mouth daily.     [provider]  ezetimibe (ZETIA) 10 MG tablet Take 10 mg by mouth every evening.  01/24/18   [provider]  flecainide (TAMBOCOR) 50 MG  tablet Take 1.5 tablets (75 mg total) by mouth 2 (two) times daily. 12/13/18 12/08/19  Burnell Blanks, MD  fluticasone (FLONASE) 50 MCG/ACT nasal spray Place 2 sprays into both nostrils daily as needed for allergies.  10/01/18   [provider]  losartan (COZAAR) 100 MG tablet Take 100 mg by mouth daily.    [provider]  Magnesium 250 MG TABS Take 250 mg by mouth 2 (two) times daily.  08/12/11   Larey Dresser, MD  metoprolol tartrate (LOPRESSOR) 25 MG tablet Take 25 mg by mouth 2 (two) times daily.  08/12/11   Larey Dresser, MD  Multiple Vitamins-Minerals (CENTRUM PO) Take 1 tablet by mouth daily.      [provider]  Omega-3 Fatty Acids (FISH OIL) 1200 MG CAPS Take 2,400 mg by mouth daily.     [provider]  omeprazole (PRILOSEC) 20 MG capsule Take 1 capsule (20 mg total) by mouth daily. 08/12/11   Larey Dresser, MD  potassium chloride SA (K-DUR) 20 MEQ tablet Take 1 tablet (20 mEq total) by mouth daily. 12/13/18   Burnell Blanks, MD  tamoxifen (NOLVADEX) 20 MG tablet Take 1 tablet (20 mg total) by mouth daily. 02/04/19   Truitt Merle, MD  traMADol (ULTRAM) 50 MG tablet Take 1 tablet (50 mg total) by mouth every 6 (six) hours as needed for moderate pain or severe pain. 01/17/19   Alphonsa Overall, MD  warfarin (COUMADIN) 5 MG tablet TAKE BY MOUTH AS DIRECTED BY ANTICOAGULATION CLINIC Patient taking differently: Take 2.5-5 mg by mouth. Take 2.5 mg by mouth on Tuesday, Thursday and Saturday, take 5 mg on Monday, Wednesday, Friday and Sunday 12/13/18   Burnell Blanks, MD    Allergies    Statins, Oxycodone-acetaminophen, Poly hist forte [doxylamine-phenylephrine], Adhesive [tape], Celebrex [celecoxib], Clindamycin/lincomycin, Penicillins, Phenergan [promethazine hcl], and Robaxin [methocarbamol]  Review of Systems   Review of Systems  Constitutional: Negative for appetite change, chills and fever.  HENT: Negative for ear pain,  rhinorrhea, sneezing and sore throat.   Eyes: Negative for photophobia and visual disturbance.  Respiratory: Negative for cough, chest tightness, shortness of breath and wheezing.   Cardiovascular: Negative for chest pain and palpitations.  Gastrointestinal: Negative for abdominal pain, blood in stool, constipation, diarrhea, nausea and vomiting.  Genitourinary: Negative for dysuria, hematuria and urgency.  Musculoskeletal: Negative for myalgias.  Skin: Negative for rash.  Neurological: Negative for dizziness, weakness and light-headedness.    Physical Exam Updated Vital Signs BP (!) 137/57   Pulse 66   Temp 98.9 F (37.2 C) (Oral)   Resp (!) 24   Ht 5\' 3"  (1.6 m)   Wt 73.5 kg  SpO2 95%   BMI 28.70 kg/m   Physical Exam Vitals and nursing note reviewed.  Constitutional:      General: She is not in acute distress.    Appearance: She is well-developed.  HENT:     Head: Normocephalic and atraumatic.     Nose: Nose normal.  Eyes:     General: No scleral icterus.       Right eye: No discharge.        Left eye: No discharge.     Pupils: Pupils are equal, round, and reactive to light.  Neck:   Cardiovascular:     Rate and Rhythm: Normal rate and regular rhythm.     Heart sounds: Normal heart sounds. No murmur. No friction rub. No gallop.   Pulmonary:     Effort: Pulmonary effort is normal. No respiratory distress.     Breath sounds: Normal breath sounds.  Abdominal:     General: Bowel sounds are normal. There is no distension.     Palpations: Abdomen is soft.     Tenderness: There is no abdominal tenderness. There is no guarding.  Musculoskeletal:        General: Normal range of motion.     Cervical back: Normal range of motion and neck supple. Muscular tenderness present.     Right lower leg: No edema.     Left lower leg: No edema.     Comments: No lower extremity edema, erythema or calf tenderness bilaterally.  Skin:    General: Skin is warm and dry.     Findings:  No rash.     Comments: Superficial wound behind right ear.  Neurological:     General: No focal deficit present.     Mental Status: She is alert and oriented to person, place, and time.     Cranial Nerves: No cranial nerve deficit.     Sensory: No sensory deficit.     Motor: No weakness or abnormal muscle tone.     Coordination: Coordination normal.     Comments: Alert, oriented x4. Pupils reactive. No facial asymmetry noted. Cranial nerves appear grossly intact. Sensation intact to light touch on face, BUE and BLE. Strength 5/5 in BLE. Reports decreased strength of right upper extremity since breast cancer surgery.     ED Results / Procedures / Treatments   Labs (all labs ordered are listed, but only abnormal results are displayed) Labs Reviewed  BASIC METABOLIC PANEL - Abnormal; Notable for the following components:      Result Value   Sodium 130 (*)    Chloride 96 (*)    BUN 35 (*)    Creatinine, Ser 1.35 (*)    Calcium 8.7 (*)    GFR calc non Af Amer 36 (*)    GFR calc Af Amer 42 (*)    All other components within normal limits  CBC WITH DIFFERENTIAL/PLATELET - Abnormal; Notable for the following components:   Platelets 148 (*)    All other components within normal limits    EKG EKG Interpretation  Date/Time:  Friday March 29 2019 20:26:26 EST Ventricular Rate:  63 PR Interval:    QRS Duration: 112 QT Interval:  433 QTC Calculation: 444 R Axis:   27 Text Interpretation: Sinus rhythm Prolonged PR interval Anterior infarct, old Borderline T abnormalities, inferior leads Confirmed by Lacretia Leigh ([id number]) on 03/29/2019 10:21:43 PM   Radiology CT Head Wo Contrast  Result Date: 03/29/2019 CLINICAL DATA:  Poly trauma, fell this evening striking  head on floor; past history of breast cancer, hypertension, diabetes mellitus, paroxysmal atrial fibrillation EXAM: CT HEAD WITHOUT CONTRAST CT CERVICAL SPINE WITHOUT CONTRAST TECHNIQUE: Multidetector CT imaging of the head  and cervical spine was performed following the standard protocol without intravenous contrast. Multiplanar CT image reconstructions of the cervical spine were also generated. COMPARISON:  CT head 03/09/2018, MRI cervical spine 09/10/2007 FINDINGS: CT HEAD FINDINGS Brain: Generalized atrophy. Normal ventricular morphology. No midline shift or mass effect. Mild small vessel chronic ischemic changes of deep cerebral white matter. Streak artifacts at temporal lobes due to motion. Low-attenuation at LEFT occipital lobe, by sagittal images likely related to same artifacts. No intracranial hemorrhage, mass lesion or definite acute infarction. No extra-axial fluid collections. Vascular: Atherosclerotic calcifications of internal carotid arteries at skull base. Skull: Calvaria intact.  Scattered motion artifacts noted. Sinuses/Orbits: Clear Other: N/A CT CERVICAL SPINE FINDINGS Alignment: Anterolisthesis at C3-C4, unchanged. Remaining alignments normal. Skull base and vertebrae: Osseous demineralization. Skull base intact. Multilevel facet degenerative changes. Multilevel degenerative disc disease changes. Vertebral body heights maintained without fracture or bone destruction. Ankylosis of C4-C5 facet joints. Encroachment upon C3-C4 and C5-C6 neural foramina bilaterally as well as RIGHT C6-C7. Soft tissues and spinal canal: Prevertebral soft tissues normal thickness. Atherosclerotic calcifications within the carotid systems bilaterally. BILATERAL carotid bifurcations extend retropharyngeal. Additional atherosclerotic calcifications at aortic arch. Disc levels:  No additional abnormalities Upper chest: Scarring at RIGHT apex. Other: N/A IMPRESSION: Atrophy with small vessel chronic ischemic changes of deep cerebral white matter. No acute intracranial abnormalities identified on exam limited by mild patient motion artifacts. Degenerative disc and facet disease changes of the cervical spine. No acute cervical spine  abnormalities. Electronically Signed   By: Lavonia Dana M.D.   On: 03/29/2019 21:05   CT Cervical Spine Wo Contrast  Result Date: 03/29/2019 CLINICAL DATA:  Poly trauma, fell this evening striking head on floor; past history of breast cancer, hypertension, diabetes mellitus, paroxysmal atrial fibrillation EXAM: CT HEAD WITHOUT CONTRAST CT CERVICAL SPINE WITHOUT CONTRAST TECHNIQUE: Multidetector CT imaging of the head and cervical spine was performed following the standard protocol without intravenous contrast. Multiplanar CT image reconstructions of the cervical spine were also generated. COMPARISON:  CT head 03/09/2018, MRI cervical spine 09/10/2007 FINDINGS: CT HEAD FINDINGS Brain: Generalized atrophy. Normal ventricular morphology. No midline shift or mass effect. Mild small vessel chronic ischemic changes of deep cerebral white matter. Streak artifacts at temporal lobes due to motion. Low-attenuation at LEFT occipital lobe, by sagittal images likely related to same artifacts. No intracranial hemorrhage, mass lesion or definite acute infarction. No extra-axial fluid collections. Vascular: Atherosclerotic calcifications of internal carotid arteries at skull base. Skull: Calvaria intact.  Scattered motion artifacts noted. Sinuses/Orbits: Clear Other: N/A CT CERVICAL SPINE FINDINGS Alignment: Anterolisthesis at C3-C4, unchanged. Remaining alignments normal. Skull base and vertebrae: Osseous demineralization. Skull base intact. Multilevel facet degenerative changes. Multilevel degenerative disc disease changes. Vertebral body heights maintained without fracture or bone destruction. Ankylosis of C4-C5 facet joints. Encroachment upon C3-C4 and C5-C6 neural foramina bilaterally as well as RIGHT C6-C7. Soft tissues and spinal canal: Prevertebral soft tissues normal thickness. Atherosclerotic calcifications within the carotid systems bilaterally. BILATERAL carotid bifurcations extend retropharyngeal. Additional  atherosclerotic calcifications at aortic arch. Disc levels:  No additional abnormalities Upper chest: Scarring at RIGHT apex. Other: N/A IMPRESSION: Atrophy with small vessel chronic ischemic changes of deep cerebral white matter. No acute intracranial abnormalities identified on exam limited by mild patient motion artifacts. Degenerative disc and facet disease changes  of the cervical spine. No acute cervical spine abnormalities. Electronically Signed   By: Lavonia Dana M.D.   On: 03/29/2019 21:05    Procedures Procedures (including critical care time)  Medications Ordered in ED Medications  sodium chloride 0.9 % bolus 500 mL (0 mLs Intravenous Stopped 03/29/19 2237)  sodium chloride 0.9 % bolus 500 mL (0 mLs Intravenous Stopped 03/29/19 2202)    ED Course  I have reviewed the triage vital signs and the nursing notes.  Pertinent labs & imaging results that were available during my care of the patient were reviewed by me and considered in my medical decision making (see chart for details).    MDM Rules/Calculators/A&P                      Cyndra Numbers Hamsini Verrilli was evaluated in Emergency Department on 03/29/19  for the symptoms described in the history of present illness. He/she was evaluated in the context of the global COVID-19 pandemic, which necessitated consideration that the patient might be at risk for infection with the SARS-CoV-2 virus that causes COVID-19. Institutional protocols and algorithms that pertain to the evaluation of patients at risk for COVID-19 are in a state of rapid change based on information released by regulatory bodies including the CDC and federal and state organizations. These policies and algorithms were followed during the patient's care in the ED.  84yo F with a positive COVID 19 test on 03/22/2019, breast cancer s/p lumpectomy in August 2020, on Tamoxifen, presents to ED after fall that occurred prior to arrival. She was sitting on a chair and trying to get  her medications into order when she turned her head quickly and fell on to the floor. She denies any LOC but husband states she may have LOC for a few seconds. She denies any headache, only reporting pain at the site of a superficial abrasion behind her R ear. She has no deficits to neurological exam during my evaluation. She is alert and oriented x3. No facial asymmetry or weakness noted, although she notes her RUE has been weaker since her lumpectomy in August, stating that "they had my arm above my head the whole time during the surgery so I think that's what did it." She denies any preceding or current chest pain, shortness of breath, fever or cough related to her COVID infection or otherwise. CT head and cervical spine here are unremarkable. Labwork significant for creatinine of 1.35, elevated from prior readings, hyponatremia at 130.  She was given 1 L of fluids to help with this. I feel that this could be the cause of her fall. Doubt ACS, PE or other emergent cause of her symptoms as her EKG shows no ischemic changes and her vital signs are reassuring, no hypoxia or tachycardia. She remains in NAD, symptom free during visit. She is requesting discharge home. I have updated patient's daughter Lattie Haw about ED workup and disposition. Patient discussed with and seen by my attending, Dr. Zenia Resides.  Patient is hemodynamically stable, in NAD. Evaluation does not show pathology that would require ongoing emergent intervention or inpatient treatment. I explained the diagnosis to the patient. Pain has been managed and has no complaints prior to discharge. Patient is comfortable with above plan and is stable for discharge at this time. All questions were answered prior to disposition. Strict return precautions for returning to the ED were discussed. Encouraged follow up with PCP.   An After Visit Summary was printed and given to the  patient.   Portions of this note were generated with [employer].  Dictation errors may occur despite best attempts at proofreading.  Final Clinical Impression(s) / ED Diagnoses Final diagnoses:  Fall in home, initial encounter  Injury of head, initial encounter  Strain of neck muscle, initial encounter  Dehydration  COVID-19 virus infection    Rx / DC Orders ED Discharge Orders    None       Delia Heady, PA-C 03/29/19 2243    Lacretia Leigh, MD 03/30/19 1204

## 2019-03-29 NOTE — ED Provider Notes (Signed)
Medical screening examination/treatment/procedure(s) were conducted as a shared visit with non-physician practitioner(s) and myself.  I personally evaluated the patient during the encounter.  EKG Interpretation  Date/Time:  Friday March 29 2019 20:26:26 EST Ventricular Rate:  63 PR Interval:    QRS Duration: 112 QT Interval:  433 QTC Calculation: 444 R Axis:   27 Text Interpretation: Sinus rhythm Prolonged PR interval Anterior infarct, old Borderline T abnormalities, inferior leads Confirmed by Lacretia Leigh [phone]) on 03/29/2019 10:56:46 PM  84 year old female here after syncopal event just prior to arrival. Denies any chest pain or shortness of breath prior to the event. Low suspicion for PE. Labs here are significant for likely dehydration. Given IV fluids and she feels back to her baseline. Will discharge home   Lacretia Leigh, MD 03/29/19 2229

## 2019-04-02 ENCOUNTER — Telehealth: Payer: Self-pay | Admitting: *Deleted

## 2019-04-02 NOTE — Telephone Encounter (Signed)
Received call from pt daughter Lattie Haw with some concerns. Pt had had increased weakness, increased anxiety and lack of energy since beginning of December. She is currently recovering from Covid.  Per Dr. Burr Medico, hold Tamoxifen until f/u appt on 06/03/18. Eliezer Lofts with recommendations. Received verbal understanding.

## 2019-04-03 ENCOUNTER — Other Ambulatory Visit: Payer: Medicare Other

## 2019-04-10 ENCOUNTER — Inpatient Hospital Stay (HOSPITAL_COMMUNITY): Payer: Medicare Other

## 2019-04-10 ENCOUNTER — Emergency Department (HOSPITAL_COMMUNITY): Payer: Medicare Other

## 2019-04-10 ENCOUNTER — Inpatient Hospital Stay (HOSPITAL_COMMUNITY)
Admission: EM | Admit: 2019-04-10 | Discharge: 2019-05-06 | DRG: 177 | Disposition: E | Payer: Medicare Other | Attending: Internal Medicine | Admitting: Internal Medicine

## 2019-04-10 ENCOUNTER — Other Ambulatory Visit: Payer: Self-pay

## 2019-04-10 ENCOUNTER — Encounter (HOSPITAL_COMMUNITY): Payer: Self-pay | Admitting: *Deleted

## 2019-04-10 DIAGNOSIS — N170 Acute kidney failure with tubular necrosis: Secondary | ICD-10-CM | POA: Diagnosis present

## 2019-04-10 DIAGNOSIS — R791 Abnormal coagulation profile: Secondary | ICD-10-CM | POA: Diagnosis present

## 2019-04-10 DIAGNOSIS — Z823 Family history of stroke: Secondary | ICD-10-CM

## 2019-04-10 DIAGNOSIS — I130 Hypertensive heart and chronic kidney disease with heart failure and stage 1 through stage 4 chronic kidney disease, or unspecified chronic kidney disease: Secondary | ICD-10-CM | POA: Diagnosis present

## 2019-04-10 DIAGNOSIS — Z515 Encounter for palliative care: Secondary | ICD-10-CM | POA: Diagnosis not present

## 2019-04-10 DIAGNOSIS — R0602 Shortness of breath: Secondary | ICD-10-CM | POA: Diagnosis present

## 2019-04-10 DIAGNOSIS — J9601 Acute respiratory failure with hypoxia: Secondary | ICD-10-CM | POA: Diagnosis not present

## 2019-04-10 DIAGNOSIS — K529 Noninfective gastroenteritis and colitis, unspecified: Secondary | ICD-10-CM | POA: Diagnosis present

## 2019-04-10 DIAGNOSIS — I480 Paroxysmal atrial fibrillation: Secondary | ICD-10-CM | POA: Diagnosis present

## 2019-04-10 DIAGNOSIS — Z881 Allergy status to other antibiotic agents status: Secondary | ICD-10-CM

## 2019-04-10 DIAGNOSIS — E876 Hypokalemia: Secondary | ICD-10-CM | POA: Diagnosis not present

## 2019-04-10 DIAGNOSIS — Z8249 Family history of ischemic heart disease and other diseases of the circulatory system: Secondary | ICD-10-CM

## 2019-04-10 DIAGNOSIS — Z66 Do not resuscitate: Secondary | ICD-10-CM | POA: Diagnosis present

## 2019-04-10 DIAGNOSIS — D892 Hypergammaglobulinemia, unspecified: Secondary | ICD-10-CM | POA: Diagnosis present

## 2019-04-10 DIAGNOSIS — I959 Hypotension, unspecified: Secondary | ICD-10-CM | POA: Diagnosis present

## 2019-04-10 DIAGNOSIS — Z888 Allergy status to other drugs, medicaments and biological substances status: Secondary | ICD-10-CM

## 2019-04-10 DIAGNOSIS — Z880 Allergy status to penicillin: Secondary | ICD-10-CM

## 2019-04-10 DIAGNOSIS — F419 Anxiety disorder, unspecified: Secondary | ICD-10-CM | POA: Diagnosis present

## 2019-04-10 DIAGNOSIS — U071 COVID-19: Principal | ICD-10-CM | POA: Diagnosis present

## 2019-04-10 DIAGNOSIS — J80 Acute respiratory distress syndrome: Secondary | ICD-10-CM | POA: Diagnosis present

## 2019-04-10 DIAGNOSIS — E785 Hyperlipidemia, unspecified: Secondary | ICD-10-CM | POA: Diagnosis present

## 2019-04-10 DIAGNOSIS — E1122 Type 2 diabetes mellitus with diabetic chronic kidney disease: Secondary | ICD-10-CM | POA: Diagnosis present

## 2019-04-10 DIAGNOSIS — R630 Anorexia: Secondary | ICD-10-CM | POA: Diagnosis present

## 2019-04-10 DIAGNOSIS — N179 Acute kidney failure, unspecified: Secondary | ICD-10-CM

## 2019-04-10 DIAGNOSIS — J1282 Pneumonia due to coronavirus disease 2019: Secondary | ICD-10-CM | POA: Diagnosis present

## 2019-04-10 DIAGNOSIS — E869 Volume depletion, unspecified: Secondary | ICD-10-CM | POA: Diagnosis present

## 2019-04-10 DIAGNOSIS — E870 Hyperosmolality and hypernatremia: Secondary | ICD-10-CM | POA: Diagnosis not present

## 2019-04-10 DIAGNOSIS — R0902 Hypoxemia: Secondary | ICD-10-CM

## 2019-04-10 DIAGNOSIS — I5033 Acute on chronic diastolic (congestive) heart failure: Secondary | ICD-10-CM | POA: Diagnosis present

## 2019-04-10 DIAGNOSIS — D649 Anemia, unspecified: Secondary | ICD-10-CM | POA: Diagnosis present

## 2019-04-10 DIAGNOSIS — N271 Small kidney, bilateral: Secondary | ICD-10-CM | POA: Diagnosis present

## 2019-04-10 DIAGNOSIS — N184 Chronic kidney disease, stage 4 (severe): Secondary | ICD-10-CM | POA: Diagnosis present

## 2019-04-10 DIAGNOSIS — Z853 Personal history of malignant neoplasm of breast: Secondary | ICD-10-CM

## 2019-04-10 DIAGNOSIS — Z6826 Body mass index (BMI) 26.0-26.9, adult: Secondary | ICD-10-CM

## 2019-04-10 DIAGNOSIS — Z9181 History of falling: Secondary | ICD-10-CM

## 2019-04-10 DIAGNOSIS — Z7901 Long term (current) use of anticoagulants: Secondary | ICD-10-CM

## 2019-04-10 DIAGNOSIS — R627 Adult failure to thrive: Secondary | ICD-10-CM | POA: Diagnosis present

## 2019-04-10 DIAGNOSIS — R6510 Systemic inflammatory response syndrome (SIRS) of non-infectious origin without acute organ dysfunction: Secondary | ICD-10-CM

## 2019-04-10 LAB — COMPREHENSIVE METABOLIC PANEL
ALT: 30 U/L (ref 0–44)
AST: 51 U/L — ABNORMAL HIGH (ref 15–41)
Albumin: 1.7 g/dL — ABNORMAL LOW (ref 3.5–5.0)
Alkaline Phosphatase: 112 U/L (ref 38–126)
Anion gap: 10 (ref 5–15)
BUN: 46 mg/dL — ABNORMAL HIGH (ref 8–23)
CO2: 24 mmol/L (ref 22–32)
Calcium: 8.4 mg/dL — ABNORMAL LOW (ref 8.9–10.3)
Chloride: 98 mmol/L (ref 98–111)
Creatinine, Ser: 3.09 mg/dL — ABNORMAL HIGH (ref 0.44–1.00)
GFR calc Af Amer: 16 mL/min — ABNORMAL LOW (ref >60)
GFR calc non Af Amer: 13 mL/min — ABNORMAL LOW (ref >60)
Glucose, Bld: 133 mg/dL — ABNORMAL HIGH (ref 70–99)
Potassium: 4.9 mmol/L (ref 3.5–5.1)
Sodium: 132 mmol/L — ABNORMAL LOW (ref 135–145)
Total Bilirubin: 1.3 mg/dL — ABNORMAL HIGH (ref 0.3–1.2)
Total Protein: 8.1 g/dL (ref 6.5–8.1)

## 2019-04-10 LAB — C-REACTIVE PROTEIN: CRP: 21 mg/dL — ABNORMAL HIGH (ref <1.0)

## 2019-04-10 LAB — CBC WITH DIFFERENTIAL/PLATELET
Abs Immature Granulocytes: 0.2 10*3/uL — ABNORMAL HIGH (ref 0.00–0.07)
Basophils Absolute: 0 10*3/uL (ref 0.0–0.1)
Basophils Relative: 0 %
Eosinophils Absolute: 0 10*3/uL (ref 0.0–0.5)
Eosinophils Relative: 0 %
HCT: 36.1 % (ref 36.0–46.0)
Hemoglobin: 12.2 g/dL (ref 12.0–15.0)
Immature Granulocytes: 1 %
Lymphocytes Relative: 8 %
Lymphs Abs: 1.5 10*3/uL (ref 0.7–4.0)
MCH: 31.2 pg (ref 26.0–34.0)
MCHC: 33.8 g/dL (ref 30.0–36.0)
MCV: 92.3 fL (ref 80.0–100.0)
Monocytes Absolute: 1 10*3/uL (ref 0.1–1.0)
Monocytes Relative: 6 %
Neutro Abs: 15.3 10*3/uL — ABNORMAL HIGH (ref 1.7–7.7)
Neutrophils Relative %: 85 %
Platelets: 458 10*3/uL — ABNORMAL HIGH (ref 150–400)
RBC: 3.91 MIL/uL (ref 3.87–5.11)
RDW: 14 % (ref 11.5–15.5)
WBC: 18 10*3/uL — ABNORMAL HIGH (ref 4.0–10.5)
nRBC: 0.2 % (ref 0.0–0.2)

## 2019-04-10 LAB — TRIGLYCERIDES: Triglycerides: 96 mg/dL (ref <150)

## 2019-04-10 LAB — POCT I-STAT 7, (LYTES, BLD GAS, ICA,H+H)
Acid-base deficit: 3 mmol/L — ABNORMAL HIGH (ref 0.0–2.0)
Bicarbonate: 21.6 mmol/L (ref 20.0–28.0)
Calcium, Ion: 1.15 mmol/L (ref 1.15–1.40)
HCT: 36 % (ref 36.0–46.0)
Hemoglobin: 12.2 g/dL (ref 12.0–15.0)
O2 Saturation: 95 %
Patient temperature: 97.9
Potassium: 4.7 mmol/L (ref 3.5–5.1)
Sodium: 131 mmol/L — ABNORMAL LOW (ref 135–145)
TCO2: 23 mmol/L (ref 22–32)
pCO2 arterial: 33.8 mmHg (ref 32.0–48.0)
pH, Arterial: 7.411 (ref 7.350–7.450)
pO2, Arterial: 72 mmHg — ABNORMAL LOW (ref 83.0–108.0)

## 2019-04-10 LAB — POC SARS CORONAVIRUS 2 AG -  ED: SARS Coronavirus 2 Ag: NEGATIVE

## 2019-04-10 LAB — PROCALCITONIN: Procalcitonin: 1.1 ng/mL

## 2019-04-10 LAB — FIBRINOGEN: Fibrinogen: >800 mg/dL — ABNORMAL HIGH (ref 210–475)

## 2019-04-10 LAB — PROTIME-INR
INR: >10 (ref 0.8–1.2)
Prothrombin Time: >90 seconds — ABNORMAL HIGH (ref 11.4–15.2)

## 2019-04-10 LAB — LACTIC ACID, PLASMA
Lactic Acid, Venous: 2.6 mmol/L (ref 0.5–1.9)
Lactic Acid, Venous: 1.8 mmol/L (ref 0.5–1.9)

## 2019-04-10 LAB — LACTATE DEHYDROGENASE: LDH: 382 U/L — ABNORMAL HIGH (ref 98–192)

## 2019-04-10 LAB — MAGNESIUM: Magnesium: 1.8 mg/dL (ref 1.7–2.4)

## 2019-04-10 LAB — FERRITIN: Ferritin: 640 ng/mL — ABNORMAL HIGH (ref 11–307)

## 2019-04-10 LAB — D-DIMER, QUANTITATIVE: D-Dimer, Quant: 1.5 ug/mL-FEU — ABNORMAL HIGH (ref 0.00–0.50)

## 2019-04-10 MED ORDER — SODIUM CHLORIDE 0.9 % IV SOLN
100.0000 mg | Freq: Every day | INTRAVENOUS | Status: DC
  Filled 2019-04-10: qty 20

## 2019-04-10 MED ORDER — SODIUM CHLORIDE 0.9 % IV BOLUS
500.0000 mL | Freq: Once | INTRAVENOUS | Status: AC
  Administered 2019-04-10: 500 mL via INTRAVENOUS

## 2019-04-10 MED ORDER — DEXAMETHASONE SODIUM PHOSPHATE 10 MG/ML IJ SOLN
8.0000 mg | Freq: Once | INTRAMUSCULAR | Status: AC
  Administered 2019-04-10: 8 mg via INTRAVENOUS
  Filled 2019-04-10: qty 1

## 2019-04-10 MED ORDER — PHYTONADIONE 5 MG PO TABS
2.5000 mg | ORAL_TABLET | Freq: Once | ORAL | Status: AC
  Administered 2019-04-10: 2.5 mg via ORAL
  Filled 2019-04-10: qty 1

## 2019-04-10 MED ORDER — SODIUM CHLORIDE 0.9 % IV SOLN
200.0000 mg | Freq: Once | INTRAVENOUS | Status: AC
  Administered 2019-04-11: 200 mg via INTRAVENOUS
  Filled 2019-04-10: qty 200
  Filled 2019-04-10: qty 40

## 2019-04-10 NOTE — ED Provider Notes (Addendum)
[facility] EMERGENCY DEPARTMENT Provider Note   CSN: [id number] Arrival date & time: 04/20/2019  1542     History Chief Complaint  Patient presents with  . Shortness of Breath    Covid+    Emily Andrews is a 85 y.o. female.  HPI   Patient is 85 year old female with history of proximal paroxysmal A. fib on warfarin presented today for progressive weakness test positive for COVID-19 proximately 2 to 3 weeks ago and has felt weak since.  Denies any shortness of breath but was found by EMS to be satting 70% on room air.  Has no prior oxygen requirements at home.  No COPD or asthma.  No history of CHF.  Patient states that she was sitting on the toilet this morning was unable to get up.  Has endorsed some diarrhea for the past 3 to 4 days.  States that her weakness has significantly worsened over the past 4 days.  States that she has decreased appetite.  Denies any abdominal pain.  Denies any chest pain, denies any history of blood clots.  States she is taking her warfarin as prescribed.  Denies any new bleeding or bruising.  She does have a small bruise to the right side of her head from a fall that she sustained on Christmas Day.   Discussed with patient's husband over the phone. Husband states that patient has been progressively weaker and weaker over the past several weeks with coughing and congestion primarily as well as decreased appetite and decreased walking.  States that she tested +2 to 3 weeks ago at [facility].  He also tested positive.  They live alone together.  Husband states that he brought her to ED today because she was becoming much weaker and lethargic.  States that she is at her mental baseline however--denies any falls or head injuries.     Past Medical History:  Diagnosis Date  . Anxiety   . Arthritis    a. R>L knees  . Cancer [facility])    Left Breast Cancer  . Diabetes mellitus   . Dysrhythmia   . Environmental allergies   . Hiatal hernia   .  Hyperlipidemia    a. statin intolerant - myalgias  . Hypertension   . Paroxysmal atrial fibrillation ([place])    a. Dx in 2004.  Off coumadin x several yrs;  b. Normal Event Monitor 03/2011.    Patient Active Problem List   Diagnosis Date Noted  . Carcinoma of upper-outer quadrant of left breast in female, estrogen receptor positive ([place]) 11/27/2018  . Closed displaced trimalleolar fracture of right ankle 03/23/2018  . Encounter for therapeutic drug monitoring 05/20/2013  . Long term (current) use of anticoagulants 07/27/2011  . Atrial fibrillation with rapid ventricular response ([place]) 06/27/2011  . Elevated LFTs 02/16/2011  . Hypertension 08/18/2010  . Atrial fibrillation ([place]) 08/18/2010  . Hypercholesterolemia 08/18/2010  . Degenerative arthritis of cervical spine 08/18/2010    Past Surgical History:  Procedure Laterality Date  . ABDOMINAL HYSTERECTOMY    . BREAST LUMPECTOMY WITH RADIOACTIVE SEED LOCALIZATION Left 01/17/2019   Procedure: LEFT BREAST LUMPECTOMY WITH RADIOACTIVE SEED LOCALIZATION;  Surgeon: Alphonsa Overall, MD;  Location: [facility];  Service: General;  Laterality: Left;  . CHOLECYSTECTOMY    . CYSTECTOMY     lower back  . lipoma tumor removed     right shoulder  . ORIF ANKLE FRACTURE Right 03/23/2018   Procedure: OPEN REDUCTION INTERNAL FIXATION (ORIF) ANKLE FRACTURE;  Surgeon: Stann Mainland,  Elly Modena, MD;  Location: [facility];  Service: Orthopedics;  Laterality: Right;     OB History   No obstetric history on file.     Family History  Problem Relation Age of Onset  . Liver disease Mother        cirrhosis - died in 76's  . Heart failure Mother   . Stroke Father        died in 2's  . Coronary artery disease Sister        living  . Coronary artery disease Brother        living    Social History   Tobacco Use  . Smoking status: Never Smoker  . Smokeless tobacco: Never Used  Substance Use Topics  . Alcohol use: No  . Drug use: No    Home  Medications Prior to Admission medications   Medication Sig Start Date End Date Taking? Authorizing Provider  Calcium Carbonate-Vitamin D (CALTRATE 600+D PO) Take 2 tablets by mouth daily.     [provider]  chlorthalidone (HYGROTON) 25 MG tablet Take 1 tablet (25 mg total) by mouth daily. 08/07/14   Larey Dresser, MD  cyanocobalamin 100 MCG tablet Take 100 mcg by mouth daily.     [provider]  ezetimibe (ZETIA) 10 MG tablet Take 10 mg by mouth every evening.  01/24/18   [provider]  flecainide (TAMBOCOR) 50 MG tablet Take 1.5 tablets (75 mg total) by mouth 2 (two) times daily. 12/13/18 12/08/19  Burnell Blanks, MD  fluticasone (FLONASE) 50 MCG/ACT nasal spray Place 2 sprays into both nostrils daily as needed for allergies.  10/01/18   [provider]  losartan (COZAAR) 100 MG tablet Take 100 mg by mouth daily.    [provider]  Magnesium 250 MG TABS Take 250 mg by mouth 2 (two) times daily.  08/12/11   Larey Dresser, MD  metoprolol tartrate (LOPRESSOR) 25 MG tablet Take 25 mg by mouth 2 (two) times daily.  08/12/11   Larey Dresser, MD  Multiple Vitamins-Minerals (CENTRUM PO) Take 1 tablet by mouth daily.      [provider]  Omega-3 Fatty Acids (FISH OIL) 1200 MG CAPS Take 2,400 mg by mouth daily.     [provider]  omeprazole (PRILOSEC) 20 MG capsule Take 1 capsule (20 mg total) by mouth daily. 08/12/11   Larey Dresser, MD  potassium chloride SA (K-DUR) 20 MEQ tablet Take 1 tablet (20 mEq total) by mouth daily. 12/13/18   Burnell Blanks, MD  tamoxifen (NOLVADEX) 20 MG tablet Take 1 tablet (20 mg total) by mouth daily. 02/04/19   Truitt Merle, MD  traMADol (ULTRAM) 50 MG tablet Take 1 tablet (50 mg total) by mouth every 6 (six) hours as needed for moderate pain or severe pain. 01/17/19   Alphonsa Overall, MD  warfarin (COUMADIN) 5 MG tablet TAKE BY MOUTH AS DIRECTED BY ANTICOAGULATION CLINIC Patient taking  differently: Take 2.5-5 mg by mouth. Take 2.5 mg by mouth on Tuesday, Thursday and Saturday, take 5 mg on Monday, Wednesday, Friday and Sunday 12/13/18   Burnell Blanks, MD    Allergies    Statins, Oxycodone-acetaminophen, Poly hist forte [doxylamine-phenylephrine], Adhesive [tape], Celebrex [celecoxib], Clindamycin/lincomycin, Penicillins, Phenergan [promethazine hcl], and Robaxin [methocarbamol]  Review of Systems   Review of Systems  Constitutional: Positive for appetite change and fatigue. Negative for chills and fever.  HENT: Positive for congestion.   Eyes: Negative for pain.  Respiratory: Negative for cough and shortness of breath (Appears short of breath but denies).   Cardiovascular: Negative for chest pain, palpitations and leg swelling.  Gastrointestinal: Positive for diarrhea. Negative for abdominal distention, abdominal pain, nausea and vomiting.  Genitourinary: Negative for dysuria.  Musculoskeletal: Negative for myalgias and neck pain.  Skin: Negative for rash.  Neurological: Negative for dizziness and headaches.    Physical Exam Updated Vital Signs BP (!) 94/54   Pulse 80   Temp 97.6 F (36.4 C) (Oral)   Resp (!) 31   Ht 5\' 5"  (1.651 m)   Wt 73.5 kg   SpO2 95%   BMI 26.96 kg/m   Physical Exam Vitals and nursing note reviewed.  Constitutional:      General: She is not in acute distress.    Appearance: She is ill-appearing. She is not toxic-appearing or diaphoretic.     Comments: Patient is 85 year old female appears somewhat somnolent but is alert and oriented able to answer questions appropriately and follow commands.  She is breathing increased rate.  HENT:     Head: Normocephalic and atraumatic.     Nose: Nose normal.     Mouth/Throat:     Mouth: Mucous membranes are dry.     Pharynx: No posterior oropharyngeal erythema.  Eyes:     General: No scleral icterus.    Extraocular Movements: Extraocular movements intact.     Pupils: Pupils are  equal, round, and reactive to light.  Cardiovascular:     Rate and Rhythm: Normal rate and regular rhythm.     Pulses: Normal pulses.     Heart sounds: Normal heart sounds.  Pulmonary:     Breath sounds: No wheezing.     Comments: Patient with a significantly increased work of breathing.  No retractions or nasal flaring or sternal notching.  However she is breathing approximately 36 respirations per minute with shallow respirations.  No wheezes or stridor.  Significant Rales auscultated diffusely left base worse. Abdominal:     Palpations: Abdomen is soft.     Tenderness: There is no abdominal tenderness. There is no guarding or rebound.  Musculoskeletal:     Cervical back: Normal range of motion. No tenderness.     Right lower leg: No edema.     Left lower leg: No edema.     Comments: Lower extremities with out edema.  No calf tenderness.  No unilateral leg swelling.  Skin:    General: Skin is warm and dry.     Capillary Refill: Capillary refill takes less than 2 seconds.  Neurological:     Mental Status: She is alert. Mental status is at baseline.  Psychiatric:        Mood and Affect: Mood normal.        Behavior: Behavior normal.     ED Results / Procedures / Treatments   Labs (all labs ordered are listed, but only abnormal results are displayed) Labs Reviewed  COMPREHENSIVE METABOLIC PANEL - Abnormal; Notable for the following components:      Result Value   Sodium 132 (*)    Glucose, Bld 133 (*)    BUN 46 (*)    Creatinine, Ser 3.09 (*)    Calcium 8.4 (*)    Albumin 1.7 (*)    AST 51 (*)    Total Bilirubin 1.3 (*)    GFR calc non Af Amer 13 (*)    GFR calc Af Amer 16 (*)    All other components within  normal limits  CBC WITH DIFFERENTIAL/PLATELET - Abnormal; Notable for the following components:   WBC 18.0 (*)    Platelets 458 (*)    Neutro Abs 15.3 (*)    Abs Immature Granulocytes 0.20 (*)    All other components within normal limits  LACTIC ACID, PLASMA -  Abnormal; Notable for the following components:   Lactic Acid, Venous 2.6 (*)    All other components within normal limits  D-DIMER, QUANTITATIVE (NOT AT [facility]) - Abnormal; Notable for the following components:   D-Dimer, Quant 1.50 (*)    All other components within normal limits  LACTATE DEHYDROGENASE - Abnormal; Notable for the following components:   LDH 382 (*)    All other components within normal limits  FIBRINOGEN - Abnormal; Notable for the following components:   Fibrinogen >800 (*)    All other components within normal limits  PROTIME-INR - Abnormal; Notable for the following components:   Prothrombin Time >90.0 (*)    INR >10.0 (*)    All other components within normal limits  POCT I-STAT 7, (LYTES, BLD GAS, ICA,H+H) - Abnormal; Notable for the following components:   pO2, Arterial 72.0 (*)    Acid-base deficit 3.0 (*)    Sodium 131 (*)    All other components within normal limits  URINE CULTURE  CULTURE, BLOOD (ROUTINE X 2)  CULTURE, BLOOD (ROUTINE X 2)  SARS CORONAVIRUS 2 (TAT 6-24 HRS)  MAGNESIUM  LACTIC ACID, PLASMA  PROCALCITONIN  TRIGLYCERIDES  URINALYSIS, COMPLETE (UACMP) WITH MICROSCOPIC  C-REACTIVE PROTEIN  FERRITIN  APTT  POC SARS CORONAVIRUS 2 AG -  ED  I-STAT ARTERIAL BLOOD GAS, ED    EKG EKG Interpretation  Date/Time:  Wednesday April 10 2019 16:54:13 EST Ventricular Rate:  81 PR Interval:    QRS Duration: 131 QT Interval:  429 QTC Calculation: 498 R Axis:   -31 Text Interpretation: Sinus or ectopic atrial rhythm Prolonged PR interval Left ventricular hypertrophy Anterolateral infarct, old Confirmed by Nat Christen [phone]) on 04/09/2019 5:58:20 PM Also confirmed by Nat Christen [phone])  on 04/09/2019 5:58:33 PM   Radiology DG Chest Portable 1 View  Result Date: 04/14/2019 CLINICAL DATA:  Hypoxia. EXAM: PORTABLE CHEST 1 VIEW COMPARISON:  01/01/2019 FINDINGS: There are diffuse bilateral hazy ground-glass airspace opacities, greatest within the  left lung zone. The heart size is borderline enlarged. Aortic calcifications are noted. There is no pneumothorax or large pleural effusion. There is no acute osseous abnormality. IMPRESSION: Diffuse bilateral ground-glass airspace opacities consistent with the patient's history of viral pneumonia. Electronically Signed   By: Constance Holster M.D.   On: 04/08/2019 16:26    Procedures Procedures (including critical care time) CRITICAL CARE Performed by: Tedd Sias   Total critical care time: 35 minutes  Critical care time was exclusive of separately billable procedures and treating other patients.  Critical care was necessary to treat or prevent imminent or life-threatening deterioration.  Critical care was time spent personally by me on the following activities: development of treatment plan with patient and/or surrogate as well as nursing, discussions with consultants, evaluation of patient's response to treatment, examination of patient, obtaining history from patient or surrogate, ordering and performing treatments and interventions, ordering and review of laboratory studies, ordering and review of radiographic studies, pulse oximetry and re-evaluation of patient's condition.   Patient is severely hypoxic on presentation.  She also has fluctuating normal and low blood pressures but is mentating well.  Requires frequent reassessments and neurologic assessments.  Requires cardiac telemetry and pulse oximetry and heavy nursing care.  Medications Ordered in ED Medications  dexamethasone (DECADRON) injection 8 mg (8 mg Intravenous Given 04/20/2019 1712)  sodium chloride 0.9 % bolus 500 mL (0 mLs Intravenous Stopped 04/16/2019 1940)  phytonadione (VITAMIN K) tablet 2.5 mg (2.5 mg Oral Given 04/14/2019 1918)    ED Course  I have reviewed the triage vital signs and the nursing notes.  Pertinent labs & imaging results that were available during my care of the patient were reviewed by me and  considered in my medical decision making (see chart for details).  Clinical Course as of Apr 09 1954  Wed Apr 10, 2019  1802 Leukocytosis 18 neutrophil predominant  CBC WITH DIFFERENTIAL(!) [WF]  6010 Elevated fibrinogen, D-dimer, lactate dehydrogenase and other pro inflammatory markers.  Procalcitonin is within normal limits doubt she has pneumonia.  Fibrinogen(!) [WF]  1804 Significantly elevated INR.  Protime-INR(!!) [WF]  1816 EKG independently read by myself borderline first-degree AV block.  No signs of acute ischemia.  She is without chest pain.  Prolonged PR interval present on prior EKGs.  EKG 12-Lead [WF]  9323 X-ray inability by myself with bilateral interstitial infiltrates consistent with COVID-19 infection.  DG Chest Portable 1 View [WF]  5573 Significantly elevated BUN and creatinine from baseline.  Suspect this is due to dehydration and poor oral intake.  Will provide with small bolus of IV fluids.  Comprehensive metabolic panel(!) [WF]  2202 Patient has elevated LFT is a chronic condition in her medical record.  AST(!): 51 [WF]    Clinical Course User Index [WF] Tedd Sias, PA   MDM Rules/Calculators/A&P                      Known Covid +64 34-year-old female with history of paroxysmal A. fib on warfarin presented today for shortness of breath and progressive weakness.  On presentation she is hypoxic, tachycardic, with increased work of breathing and appears somewhat somnolent.  She is afebrile.  Doubt pneumonia.  She has no history of blood clots and is currently on warfarin that she is taking as prescribed.  Doubt pulmonary embolism she has no hemoptysis, or significant risk factors.  Does have a history of breast cancer however she had it surgically removed and to her knowledge is in complete remission.  Is not on any oral contraceptive or hormonal medications.  No unilateral leg swelling.  She is also not tachycardic.  Patient does appear to be critically ill as  she is suffering acute hypoxic respiratory failure.  She has no oxygen requirement at home and is dependent on oxygen here and desaturates quickly to low 80s without her 15 L NRB  As patient is known to have Covid and has new oxygen requirement will go ahead and get 8 mg of Decadron here.  Patient is known to have Covid infection suspect that presentation today is due to worsening compensation with her infection.  She is not currently in A. fib.  She has no chest pain doubt ACS.  Rapid antigen Covid test pending.  Lab, EKG, imaging reviewed above.  I personally reviewed the EMR and on patient is a hospital records.  Contacted patient's husband for collateral.  6:15 PM consulted hospitalist for admission.  Dr. Marthenia Rolling requested ABG before admission.  ABG shows mild hypoxia.  No acidosis.  7:30 PM repaged hospitalist discussed results of ABG.  7:55 PM discussed with Dr. Marthenia Rolling who will accept the patient into  the hospital. Appreciate his consultation. Patient care transferred to hospitalist at this time.    Final Clinical Impression(s) / ED Diagnoses Final diagnoses:  COVID-19  Acute respiratory failure with hypoxia (HCC)  AKI (acute kidney injury) ([place])  SIRS (systemic inflammatory response syndrome) (HCC)  Supratherapeutic INR  SOB (shortness of breath)    Rx / DC Orders ED Discharge Orders    None       Tedd Sias, [place] 05/03/2019 Lona Kettle    Nat Christen, MD 05/01/2019 [place], [place], [place] 04/11/19 Jen Mow    Nat Christen, MD 04/12/19 2202

## 2019-04-10 NOTE — ED Triage Notes (Signed)
Covid positive pt from home where she lives with her family.  Pt has been having increasing generalized weakness,  Diarrhea for 3 days.  Pt was dx with covid on 12/24.  Spo2 was 70% on RA for ems.  Pt was placed on NRB and came up to 92% on 15L NRB for ems. Pt is alert and oriented.  IV in left AC, pt had 50cc NS bolus from EMS pta.  Pt is alert and oriented and able to speak with me.

## 2019-04-10 NOTE — H&P (Signed)
History and Physical  Emily Andrews BHA:[id number] DOB: 10-29-1934 DOA: 04/21/2019  Referring physician: ER provider PCP: Orpah Melter, MD  Outpatient Specialists: ER provider Patient coming from: Home  Chief Complaint: Severe weakness   HPI:  Patient is an 85 year old Caucasian female with past medical history significant for proximal atrial fibrillation, hypertension, hyperlipidemia and diabetes mellitus.  Apparently, patient tested positive for COVID-19 virus infection about 3 weeks ago and has been managing herself supportively at home.  Over the last 3 weeks, patient has continued to deteriorate.  Patient reported feeling very weak.  Earlier today, patient was weak to the point that she was not able to get up from a sitting position.  There is associated diarrhea.  No fever or chills.  On presentation to the hospital, patient's O2 sat was found to be 70%.  Patient was started on nonrebreather and 15 L of supplemental oxygen.  Chest x-ray revealed "Diffuse bilateral ground-glass airspace opacities consistent with the patient's history of viral pneumonia".  ABG revealed pH of 7.41, PCO2 of 33.8 and PO2 of 72.  Of no headache, no neck pain, no chest pain, no URI symptoms, no urinary symptoms.  Patient did not endorse shortness of breath prior to presentation.  ED Course: On presentation to the ER, temperature was 97.6, blood pressure 111/53, heart rate of 79 with respiratory rate of 30.  Pertinent labs: CBC revealed WBC of 18, hemoglobin of 12.3, hematocrit of 36.1, MCV of 90.3 with platelet count of 458.  Patient reveals sodium of 133, potassium of 4.9, chloride of 90, CO2 24, BUN of 46.  Serum creatinine of 3.09 (up from 1.35), blood sugar of 133.  Minutes 1.7, magnesium of 1.8, AST of 51, ALT of 30, total protein of 8.1.  LDH is 382.  EKG: Independently reviewed.   Imaging: independently reviewed.   Review of Systems:  Negative for fever, visual changes, sore throat, rash, new  muscle aches, chest pain, SOB, dysuria, bleeding.  Past Medical History:  Diagnosis Date  . Anxiety   . Arthritis    a. R>L knees  . Cancer [facility])    Left Breast Cancer  . Diabetes mellitus   . Dysrhythmia   . Environmental allergies   . Hiatal hernia   . Hyperlipidemia    a. statin intolerant - myalgias  . Hypertension   . Paroxysmal atrial fibrillation ([place])    a. Dx in 2004.  Off coumadin x several yrs;  b. Normal Event Monitor 03/2011.    Past Surgical History:  Procedure Laterality Date  . ABDOMINAL HYSTERECTOMY    . BREAST LUMPECTOMY WITH RADIOACTIVE SEED LOCALIZATION Left 01/17/2019   Procedure: LEFT BREAST LUMPECTOMY WITH RADIOACTIVE SEED LOCALIZATION;  Surgeon: Alphonsa Overall, MD;  Location: [facility];  Service: General;  Laterality: Left;  . CHOLECYSTECTOMY    . CYSTECTOMY     lower back  . lipoma tumor removed     right shoulder  . ORIF ANKLE FRACTURE Right 03/23/2018   Procedure: OPEN REDUCTION INTERNAL FIXATION (ORIF) ANKLE FRACTURE;  Surgeon: Nicholes Stairs, MD;  Location: [facility];  Service: Orthopedics;  Laterality: Right;     reports that she has never smoked. She has never used smokeless tobacco. She reports that she does not drink alcohol or use drugs.  Allergies  Allergen Reactions  . Statins Other (See Comments)    Atrophy of leg muscle according to patient.  . Oxycodone-Acetaminophen Other (See Comments)  . Poly Hist Forte [Doxylamine-Phenylephrine]   . Adhesive [Tape]  Itching, Rash and Other (See Comments)    Pt allergic to monitor electrodes  . Celebrex [Celecoxib] Other (See Comments)    unknown  . Clindamycin/Lincomycin Rash  . Penicillins Other (See Comments)    Has patient had a PCN reaction causing immediate rash, facial/tongue/throat swelling, SOB or lightheadedness with hypotension: Y Has patient had a PCN reaction causing severe rash involving mucus membranes or skin necrosis: Y Has patient had a PCN reaction that required  hospitalization: N Has patient had a PCN reaction occurring within the last 10 years: N If all of the above answers are "NO", then may proceed with Cephalosporin use.   Marland Kitchen Phenergan [Promethazine Hcl] Other (See Comments)    unknown  . Robaxin [Methocarbamol] Other (See Comments)    unknown    Family History  Problem Relation Age of Onset  . Liver disease Mother        cirrhosis - died in 51's  . Heart failure Mother   . Stroke Father        died in 15's  . Coronary artery disease Sister        living  . Coronary artery disease Brother        living     Prior to Admission medications   Medication Sig Start Date End Date Taking? Authorizing Provider  Calcium Carbonate-Vitamin D (CALTRATE 600+D PO) Take 2 tablets by mouth daily.    Yes [provider]  chlorthalidone (HYGROTON) 25 MG tablet Take 1 tablet (25 mg total) by mouth daily. 08/07/14  Yes Larey Dresser, MD  cyanocobalamin 100 MCG tablet Take 100 mcg by mouth daily.    Yes [provider]  ezetimibe (ZETIA) 10 MG tablet Take 10 mg by mouth every evening.  01/24/18  Yes [provider]  flecainide (TAMBOCOR) 50 MG tablet Take 1.5 tablets (75 mg total) by mouth 2 (two) times daily. 12/13/18 12/08/19 Yes Burnell Blanks, MD  fluticasone (FLONASE) 50 MCG/ACT nasal spray Place 2 sprays into both nostrils daily as needed for allergies.  10/01/18  Yes [provider]  losartan (COZAAR) 100 MG tablet Take 100 mg by mouth daily.   Yes [provider]  Magnesium 250 MG TABS Take 250 mg by mouth 2 (two) times daily.  08/12/11  Yes Larey Dresser, MD  metoprolol tartrate (LOPRESSOR) 25 MG tablet Take 25 mg by mouth 2 (two) times daily.  08/12/11  Yes Larey Dresser, MD  Multiple Vitamins-Minerals (CENTRUM PO) Take 1 tablet by mouth daily.     Yes [provider]  Omega-3 Fatty Acids (FISH OIL) 1200 MG CAPS Take 2,400 mg by mouth daily.    Yes [provider]    omeprazole (PRILOSEC) 20 MG capsule Take 1 capsule (20 mg total) by mouth daily. 08/12/11  Yes Larey Dresser, MD  potassium chloride SA (K-DUR) 20 MEQ tablet Take 1 tablet (20 mEq total) by mouth daily. 12/13/18  Yes Burnell Blanks, MD  tamoxifen (NOLVADEX) 20 MG tablet Take 1 tablet (20 mg total) by mouth daily. 02/04/19  Yes Truitt Merle, MD  warfarin (COUMADIN) 5 MG tablet TAKE BY MOUTH AS DIRECTED BY ANTICOAGULATION CLINIC Patient taking differently: Take 2.5-5 mg by mouth. Take 2.5 mg by mouth on Tuesday, Thursday and Saturday, take 5 mg on Monday, Wednesday, Friday and Sunday 12/13/18  Yes Burnell Blanks, MD    Physical Exam: Vitals:   04/18/2019 1930 04/27/2019 1945 05/01/2019 2015 04/16/2019 2115  BP: (!) 94/54  100/61 109/65   Pulse: 80 81  83  Resp:    20  Temp:      TempSrc:      SpO2: 95% 93%  94%  Weight:      Height:        Constitutional:  . Appears calm and comfortable Eyes:  Marland Kitchen Mild pallor. No jaundice.  ENMT:  . external ears, nose appear normal.  Dry buccal mucosa. Neck:  . Neck is supple. No JVD Respiratory:  . Decreased air entry globally, with crackles, worse on the left lung field. Cardiovascular:  . S1S2 . No LE extremity edema   Abdomen:  . Abdomen is soft and non tender. Organs are difficult to assess. Neurologic:  . Awake and alert. . Moves all limbs.  Wt Readings from Last 3 Encounters:  04/24/2019 73.5 kg  03/29/19 73.5 kg  02/04/19 75.1 kg    I have personally reviewed following labs and imaging studies  Labs on Admission:  CBC: Recent Labs  Lab 05/02/2019 1633 04/09/2019 1918  WBC 18.0*  --   NEUTROABS 15.3*  --   HGB 12.2 12.2  HCT 36.1 36.0  MCV 92.3  --   PLT 458*  --    Basic Metabolic Panel: Recent Labs  Lab 04/28/2019 1633 04/12/2019 1918  NA 132* 131*  K 4.9 4.7  CL 98  --   CO2 24  --   GLUCOSE 133*  --   BUN 46*  --   CREATININE 3.09*  --   CALCIUM 8.4*  --   MG 1.8  --    Liver Function Tests: Recent  Labs  Lab 04/09/2019 1633  AST 51*  ALT 30  ALKPHOS 112  BILITOT 1.3*  PROT 8.1  ALBUMIN 1.7*   No results for input(s): LIPASE, AMYLASE in the last 168 hours. No results for input(s): AMMONIA in the last 168 hours. Coagulation Profile: Recent Labs  Lab 04/29/2019 1645  INR >10.0*   Cardiac Enzymes: No results for input(s): CKTOTAL, CKMB, CKMBINDEX, TROPONINI in the last 168 hours. BNP (last 3 results) No results for input(s): PROBNP in the last 8760 hours. HbA1C: No results for input(s): HGBA1C in the last 72 hours. CBG: No results for input(s): GLUCAP in the last 168 hours. Lipid Profile: Recent Labs    04/05/2019 1639  TRIG 96   Thyroid Function Tests: No results for input(s): TSH, T4TOTAL, FREET4, T3FREE, THYROIDAB in the last 72 hours. Anemia Panel: No results for input(s): VITAMINB12, FOLATE, FERRITIN, TIBC, IRON, RETICCTPCT in the last 72 hours. Urine analysis: No results found for: COLORURINE, APPEARANCEUR, LABSPEC, PHURINE, GLUCOSEU, HGBUR, BILIRUBINUR, KETONESUR, PROTEINUR, UROBILINOGEN, NITRITE, LEUKOCYTESUR Sepsis Labs: @LABRCNTIP (procalcitonin:4,lacticidven:4) )No results found for this or any previous visit (from the past 240 hour(s)).    Radiological Exams on Admission: DG Chest Portable 1 View  Result Date: 05/02/2019 CLINICAL DATA:  Hypoxia. EXAM: PORTABLE CHEST 1 VIEW COMPARISON:  01/01/2019 FINDINGS: There are diffuse bilateral hazy ground-glass airspace opacities, greatest within the left lung zone. The heart size is borderline enlarged. Aortic calcifications are noted. There is no pneumothorax or large pleural effusion. There is no acute osseous abnormality. IMPRESSION: Diffuse bilateral ground-glass airspace opacities consistent with the patient's history of viral pneumonia. Electronically Signed   By: Constance Holster M.D.   On: 04/13/2019 16:26    Active Problems:   Pneumonia due to severe acute respiratory syndrome coronavirus 2  (SARS-CoV-2)   Assessment/Plan Pneumonia due to SARS-CoV-2/acute respiratory failure with hypoxia: -Admit patient for  further assessment and management -Start patient on dexamethasone and remdesivir (if renal function permits) -Check inflammatory markers daily -Supportive care  Enteritis possible secondary to SARS-CoV-2: -See above -Cautious hydration  Acute kidney injury: -Etiology unclear. -Patient has had diarrhea, positive for Covid and has hypergammaglobulinemia. -Please consult nephrology team in the morning as there may be need to work patient up for possible myeloma as well  Volume depletion: -Cautious hydration  Supratherapeutic INR: -INR is greater than 10 -ER has already given patient vitamin K -Monitor closely   Paroxysmal atrial fibrillation: -Hold Coumadin for now -Consult pharmacy to manage Coumadin -Monitor INR daily for now -Control heart rate  Hypotension: -Responded to volume resuscitation -Hold medications that can drop blood pressures.   -Continue to monitor closely.    DVT prophylaxis: Patient was on Coumadin, with supratherapeutic INR (greater than 10) Code Status:: Family Communication:  Disposition Plan: This will depend on hospital course. Consults called: None Admission status: None  Time spent: 65 minutes  Dana Allan, MD  [facility] Pager #: [phone] 7PM-7AM contact night coverage as above  04/07/2019, 10:20 PM

## 2019-04-10 NOTE — ED Notes (Signed)
Date and time results received: 04/25/2019 5:33 PM  Test: INR Critical Value: >10  Name of Provider Notified:[place] PA

## 2019-04-10 NOTE — ED Notes (Signed)
Date and time results received: 04/21/2019 1711  Test: Lactic Acid Critical Value: 2.6  Name of Provider Notified: [place] PA

## 2019-04-11 DIAGNOSIS — J9601 Acute respiratory failure with hypoxia: Secondary | ICD-10-CM

## 2019-04-11 LAB — COMPREHENSIVE METABOLIC PANEL
ALT: 28 U/L (ref 0–44)
AST: 46 U/L — ABNORMAL HIGH (ref 15–41)
Albumin: 2 g/dL — ABNORMAL LOW (ref 3.5–5.0)
Alkaline Phosphatase: 85 U/L (ref 38–126)
Anion gap: 14 (ref 5–15)
BUN: 61 mg/dL — ABNORMAL HIGH (ref 8–23)
CO2: 22 mmol/L (ref 22–32)
Calcium: 8.1 mg/dL — ABNORMAL LOW (ref 8.9–10.3)
Chloride: 96 mmol/L — ABNORMAL LOW (ref 98–111)
Creatinine, Ser: 3.64 mg/dL — ABNORMAL HIGH (ref 0.44–1.00)
GFR calc Af Amer: 13 mL/min — ABNORMAL LOW (ref >60)
GFR calc non Af Amer: 11 mL/min — ABNORMAL LOW (ref >60)
Glucose, Bld: 143 mg/dL — ABNORMAL HIGH (ref 70–99)
Potassium: 4.6 mmol/L (ref 3.5–5.1)
Sodium: 132 mmol/L — ABNORMAL LOW (ref 135–145)
Total Bilirubin: 1.1 mg/dL (ref 0.3–1.2)
Total Protein: 6 g/dL — ABNORMAL LOW (ref 6.5–8.1)

## 2019-04-11 LAB — ABO/RH: ABO/RH(D): AB POS

## 2019-04-11 LAB — PROTEIN / CREATININE RATIO, URINE
Creatinine, Urine: 33.05 mg/dL
Protein Creatinine Ratio: 4.6 mg/mg{Cre} — ABNORMAL HIGH (ref 0.00–0.15)
Total Protein, Urine: 152 mg/dL

## 2019-04-11 LAB — CBC WITH DIFFERENTIAL/PLATELET
Abs Immature Granulocytes: 0.12 10*3/uL — ABNORMAL HIGH (ref 0.00–0.07)
Basophils Absolute: 0 10*3/uL (ref 0.0–0.1)
Basophils Relative: 0 %
Eosinophils Absolute: 0 10*3/uL (ref 0.0–0.5)
Eosinophils Relative: 0 %
HCT: 33.7 % — ABNORMAL LOW (ref 36.0–46.0)
Hemoglobin: 11.1 g/dL — ABNORMAL LOW (ref 12.0–15.0)
Immature Granulocytes: 1 %
Lymphocytes Relative: 9 %
Lymphs Abs: 1.2 10*3/uL (ref 0.7–4.0)
MCH: 30.1 pg (ref 26.0–34.0)
MCHC: 32.9 g/dL (ref 30.0–36.0)
MCV: 91.3 fL (ref 80.0–100.0)
Monocytes Absolute: 0.6 10*3/uL (ref 0.1–1.0)
Monocytes Relative: 5 %
Neutro Abs: 11.3 10*3/uL — ABNORMAL HIGH (ref 1.7–7.7)
Neutrophils Relative %: 85 %
Platelets: 419 10*3/uL — ABNORMAL HIGH (ref 150–400)
RBC: 3.69 MIL/uL — ABNORMAL LOW (ref 3.87–5.11)
RDW: 13.9 % (ref 11.5–15.5)
WBC: 13.3 10*3/uL — ABNORMAL HIGH (ref 4.0–10.5)
nRBC: 0.2 % (ref 0.0–0.2)

## 2019-04-11 LAB — C-REACTIVE PROTEIN: CRP: 18.9 mg/dL — ABNORMAL HIGH (ref <1.0)

## 2019-04-11 LAB — SODIUM, URINE, RANDOM: Sodium, Ur: 76 mmol/L

## 2019-04-11 LAB — PROTIME-INR
INR: >10 (ref 0.8–1.2)
Prothrombin Time: >90 seconds — ABNORMAL HIGH (ref 11.4–15.2)

## 2019-04-11 LAB — URINALYSIS, COMPLETE (UACMP) WITH MICROSCOPIC
Bilirubin Urine: NEGATIVE
Glucose, UA: NEGATIVE mg/dL
Ketones, ur: NEGATIVE mg/dL
Leukocytes,Ua: NEGATIVE
Nitrite: NEGATIVE
Protein, ur: 100 mg/dL — AB
RBC / HPF: >50 RBC/hpf — ABNORMAL HIGH (ref 0–5)
Specific Gravity, Urine: 1.008 (ref 1.005–1.030)
WBC, UA: >50 WBC/hpf — ABNORMAL HIGH (ref 0–5)
pH: 6 (ref 5.0–8.0)

## 2019-04-11 LAB — BRAIN NATRIURETIC PEPTIDE: B Natriuretic Peptide: 771 pg/mL — ABNORMAL HIGH (ref 0.0–100.0)

## 2019-04-11 LAB — GLUCOSE, CAPILLARY
Glucose-Capillary: 143 mg/dL — ABNORMAL HIGH (ref 70–99)
Glucose-Capillary: 188 mg/dL — ABNORMAL HIGH (ref 70–99)

## 2019-04-11 LAB — SARS CORONAVIRUS 2 (TAT 6-24 HRS): SARS Coronavirus 2: POSITIVE — AB

## 2019-04-11 LAB — APTT: aPTT: 122 seconds — ABNORMAL HIGH (ref 24–36)

## 2019-04-11 LAB — MAGNESIUM: Magnesium: 1.9 mg/dL (ref 1.7–2.4)

## 2019-04-11 LAB — D-DIMER, QUANTITATIVE: D-Dimer, Quant: 1.2 ug/mL-FEU — ABNORMAL HIGH (ref 0.00–0.50)

## 2019-04-11 LAB — PHOSPHORUS: Phosphorus: 5.3 mg/dL — ABNORMAL HIGH (ref 2.5–4.6)

## 2019-04-11 MED ORDER — EZETIMIBE 10 MG PO TABS
10.0000 mg | ORAL_TABLET | Freq: Every evening | ORAL | Status: DC
  Administered 2019-04-11 – 2019-04-19 (×9): 10 mg via ORAL
  Filled 2019-04-11 (×10): qty 1

## 2019-04-11 MED ORDER — TOCILIZUMAB 400 MG/20ML IV SOLN
600.0000 mg | Freq: Once | INTRAVENOUS | Status: AC
  Administered 2019-04-11: 600 mg via INTRAVENOUS
  Filled 2019-04-11: qty 20

## 2019-04-11 MED ORDER — SODIUM CHLORIDE 0.9 % IV SOLN: INTRAVENOUS | Status: DC

## 2019-04-11 MED ORDER — PANTOPRAZOLE SODIUM 40 MG PO TBEC
40.0000 mg | DELAYED_RELEASE_TABLET | Freq: Two times a day (BID) | ORAL | Status: DC
  Administered 2019-04-11 – 2019-04-20 (×19): 40 mg via ORAL
  Filled 2019-04-11 (×19): qty 1

## 2019-04-11 MED ORDER — FLECAINIDE ACETATE 50 MG PO TABS
75.0000 mg | ORAL_TABLET | Freq: Two times a day (BID) | ORAL | Status: DC
  Administered 2019-04-11 – 2019-04-12 (×3): 75 mg via ORAL
  Filled 2019-04-11 (×4): qty 2

## 2019-04-11 MED ORDER — THIAMINE HCL 100 MG PO TABS
100.0000 mg | ORAL_TABLET | Freq: Every day | ORAL | Status: DC
  Administered 2019-04-11 – 2019-04-20 (×10): 100 mg via ORAL
  Filled 2019-04-11 (×10): qty 1

## 2019-04-11 MED ORDER — DEXAMETHASONE SODIUM PHOSPHATE 10 MG/ML IJ SOLN
10.0000 mg | Freq: Once | INTRAMUSCULAR | Status: AC
  Administered 2019-04-11: 10 mg via INTRAVENOUS
  Filled 2019-04-11: qty 1

## 2019-04-11 MED ORDER — TAMOXIFEN CITRATE 10 MG PO TABS
20.0000 mg | ORAL_TABLET | Freq: Every day | ORAL | Status: DC
  Administered 2019-04-11 – 2019-04-20 (×10): 20 mg via ORAL
  Filled 2019-04-11: qty 1
  Filled 2019-04-11 (×9): qty 2

## 2019-04-11 MED ORDER — SODIUM CHLORIDE 0.9 % IV SOLN: 200.0000 mg | Freq: Once | INTRAVENOUS | Status: DC

## 2019-04-11 MED ORDER — DEXAMETHASONE SODIUM PHOSPHATE 10 MG/ML IJ SOLN: 6.0000 mg | INTRAMUSCULAR | Status: DC

## 2019-04-11 MED ORDER — IPRATROPIUM-ALBUTEROL 20-100 MCG/ACT IN AERS
1.0000 | INHALATION_SPRAY | Freq: Four times a day (QID) | RESPIRATORY_TRACT | Status: DC
  Administered 2019-04-11 – 2019-04-20 (×32): 1 via RESPIRATORY_TRACT
  Filled 2019-04-11: qty 4

## 2019-04-11 MED ORDER — SODIUM CHLORIDE 0.9 % IV SOLN: 100.0000 mg | Freq: Every day | INTRAVENOUS | Status: DC

## 2019-04-11 MED ORDER — VITAMIN B-12 100 MCG PO TABS
100.0000 ug | ORAL_TABLET | Freq: Every day | ORAL | Status: DC
  Administered 2019-04-11 – 2019-04-20 (×10): 100 ug via ORAL
  Filled 2019-04-11 (×10): qty 1

## 2019-04-11 MED ORDER — FUROSEMIDE 10 MG/ML IJ SOLN
60.0000 mg | Freq: Once | INTRAMUSCULAR | Status: AC
  Administered 2019-04-11: 60 mg via INTRAVENOUS
  Filled 2019-04-11: qty 6

## 2019-04-11 MED ORDER — SODIUM CHLORIDE 0.9 % IV SOLN
500.0000 mg | Freq: Every day | INTRAVENOUS | Status: DC
  Administered 2019-04-11: 500 mg via INTRAVENOUS
  Filled 2019-04-11: qty 500

## 2019-04-11 MED ORDER — VITAMIN K1 10 MG/ML IJ SOLN: 2.0000 mg | Freq: Once | INTRAVENOUS | Status: DC

## 2019-04-11 MED ORDER — ADULT MULTIVITAMIN W/MINERALS CH
1.0000 | ORAL_TABLET | Freq: Every day | ORAL | Status: DC
  Administered 2019-04-11 – 2019-04-20 (×10): 1 via ORAL
  Filled 2019-04-11 (×10): qty 1

## 2019-04-11 MED ORDER — ZINC SULFATE 220 (50 ZN) MG PO CAPS
220.0000 mg | ORAL_CAPSULE | Freq: Every day | ORAL | Status: DC
  Administered 2019-04-11 – 2019-04-20 (×10): 220 mg via ORAL
  Filled 2019-04-11 (×10): qty 1

## 2019-04-11 MED ORDER — SODIUM CHLORIDE 0.9 % IV SOLN
1.0000 g | Freq: Every day | INTRAVENOUS | Status: DC
  Administered 2019-04-11: 1 g via INTRAVENOUS
  Filled 2019-04-11: qty 10

## 2019-04-11 MED ORDER — VITAMIN K1 10 MG/ML IJ SOLN
5.0000 mg | Freq: Once | INTRAVENOUS | Status: AC
  Administered 2019-04-11: 5 mg via INTRAVENOUS
  Filled 2019-04-11: qty 0.5

## 2019-04-11 MED ORDER — SODIUM CHLORIDE 0.9 % IV SOLN
100.0000 mg | Freq: Every day | INTRAVENOUS | Status: AC
  Administered 2019-04-12 – 2019-04-15 (×4): 100 mg via INTRAVENOUS
  Filled 2019-04-11 (×4): qty 20

## 2019-04-11 MED ORDER — FOLIC ACID 1 MG PO TABS
1.0000 mg | ORAL_TABLET | Freq: Every day | ORAL | Status: DC
  Administered 2019-04-11 – 2019-04-20 (×10): 1 mg via ORAL
  Filled 2019-04-11 (×10): qty 1

## 2019-04-11 MED ORDER — ASCORBIC ACID 500 MG PO TABS
500.0000 mg | ORAL_TABLET | Freq: Every day | ORAL | Status: DC
  Administered 2019-04-11 – 2019-04-20 (×10): 500 mg via ORAL
  Filled 2019-04-11 (×10): qty 1

## 2019-04-11 MED ORDER — FLUTICASONE PROPIONATE 50 MCG/ACT NA SUSP
2.0000 | Freq: Every day | NASAL | Status: DC | PRN
  Filled 2019-04-11: qty 16

## 2019-04-11 NOTE — Progress Notes (Signed)
PROGRESS NOTE                                                                                                                                                                                                             Patient Demographics:    Emily Andrews, is a 85 y.o. female, DOB - [birth year], JSR:[id number]  Outpatient Primary MD for the patient is Orpah Melter, MD    LOS - 1  Admit date - 04/09/2019    Chief Complaint  Patient presents with  . Shortness of Breath    Covid+       Brief Narrative   85 year old Caucasian female with past medical history significant for proximal atrial fibrillation, [place] vas 2 score of at least 3 on Coumadin, hypertension, hyperlipidemia and diabetes mellitus.  Apparently, patient tested positive for COVID-19 virus infection about 3 weeks ago, she presented with shortness of breath was diagnosed with acute hypoxic respiratory failure due to COVID-19 pneumonia and admitted to hospital.   Subjective:    Emily Andrews today has, No headache, No chest pain, No abdominal pain - No Nausea, No new weakness tingling or numbness, ++ SOB.   Assessment  & Plan :      1. Acute Hypoxic Resp. Failure due to Acute Covid 19 Viral Pneumonitis during the ongoing 2020 Covid 19 Pandemic - she has severe disease, she has been placed on IV steroids along with remdesivir however currently requiring 15 L of oxygen, CRP is elevated, will right away use Actemra.  She and her daughter have consented for the same.  Condition is extremely tenuous  Encouraged the patient to sit up in chair in the daytime use I-S and flutter valve for pulmonary toiletry and then prone in bed when at night.  Actemra off label use - patient and her daughter were told that if COVID-19 pneumonitis gets worse we might potentially use Actemra off label, patient denies any known history of tuberculosis or hepatitis, understands the risks  and benefits and wants to proceed with Actemra treatment .   SpO2: 94 % O2 Flow Rate (L/min): 15 L/min  Recent Labs  Lab 04/27/2019 1633 04/14/2019 1700 04/15/2019 1904 04/11/19 0724  CRP  --  21.0*  --  18.9*  DDIMER 1.50*  --   --  1.20*  FERRITIN  --  640*  --   --  BNP  --   --   --  771.0*  PROCALCITON 1.10  --   --   --   SARSCOV2NAA  --   --  POSITIVE*  --     Hepatic Function Latest Ref Rng & Units 04/11/2019 04/13/2019 11/30/2018  Total Protein 6.5 - 8.1 g/dL 6.0(L) 8.1 6.9  Albumin 3.5 - 5.0 g/dL 2.0(L) 1.7(L) 3.9  AST 15 - 41 U/L 46(H) 51(H) 20  ALT 0 - 44 U/L '28 30 13  ' Alk Phosphatase 38 - 126 U/L 85 112 67  Total Bilirubin 0.3 - 1.2 mg/dL 1.1 1.3(H) 0.8  Bilirubin, Direct 0.0 - 0.3 mg/dL - - -     2.  Acute on chronic diastolic CHF EF 78% on echocardiogram done in 2019-IV Lasix 60 mg on 04/11/2019.  3.  AKI -could be ATN due to recent infection, was also on ARB.  Renal ultrasound nonacute but does show chronic kidney disease.  Last creatinine was under 1.2 in 2019, for now Lasix, creatinine has actually worsened after IV fluid restriction since admission.  4.  Paroxysmal atrial fibrillation.  Advised to score of greater than 3.  INR is supratherapeutic despite oral vitamin K, vitamin K 04/11/2019.  No signs of bleeding, oral twice daily PPI added.     Condition - Extremely Guarded  Family Communication  :  Daughter 1/7  Code Status :  DNR  Diet :   Diet Order            Diet heart healthy/carb modified Room service appropriate? Yes; Fluid consistency: Thin  Diet effective now               Disposition Plan  :  TBD  Consults  :  None  Procedures  :    Renal US - non acute  PUD Prophylaxis : PPI  DVT Prophylaxis  : Coumadin  Lab Results  Component Value Date   INR >10.0 (HH) 04/11/2019   INR >10.0 (HH) 04/26/2019   INR 2.4 03/06/2019     Lab Results  Component Value Date   PLT 419 (H) 04/11/2019    Inpatient Medications  Scheduled  Meds: . vitamin C  500 mg Oral Daily  . dexamethasone (DECADRON) injection  10 mg Intravenous Once  . ezetimibe  10 mg Oral QPM  . flecainide  75 mg Oral BID  . folic acid  1 mg Oral Daily  . furosemide  60 mg Intravenous Once  . Ipratropium-Albuterol  1 puff Inhalation Q6H  . multivitamin with minerals  1 tablet Oral Daily  . tamoxifen  20 mg Oral Daily  . thiamine  100 mg Oral Daily  . cyanocobalamin  100 mcg Oral Daily  . zinc sulfate  220 mg Oral Daily   Continuous Infusions: . sodium chloride 75 mL/hr at 04/11/19 0510  . [START ON 04/12/2019] remdesivir 100 mg in NS 100 mL    . tocilizumab (ACTEMRA) - non-COVID treatment     PRN Meds:.fluticasone  Antibiotics  :    Anti-infectives (From admission, onward)   Start     Dose/Rate Route Frequency Ordered Stop   04/12/19 1000  remdesivir 100 mg in sodium chloride 0.9 % 100 mL IVPB  Status:  Discontinued     100 mg 200 mL/hr over 30 Minutes Intravenous Daily 04/11/19 0433 04/11/19 0446   04/12/19 1000  remdesivir 100 mg in sodium chloride 0.9 % 100 mL IVPB     100 mg 200 mL/hr over 30 Minutes  Intravenous Daily 04/11/19 0447 04/16/19 0959   04/11/19 1000  remdesivir 100 mg in sodium chloride 0.9 % 100 mL IVPB  Status:  Discontinued     100 mg 200 mL/hr over 30 Minutes Intravenous Daily 04/09/2019 2234 04/11/19 0447   04/11/19 0500  azithromycin (ZITHROMAX) 500 mg in sodium chloride 0.9 % 250 mL IVPB  Status:  Discontinued     500 mg 250 mL/hr over 60 Minutes Intravenous Daily 04/11/19 0433 04/11/19 0734   04/11/19 0500  cefTRIAXone (ROCEPHIN) 1 g in sodium chloride 0.9 % 100 mL IVPB  Status:  Discontinued     1 g 200 mL/hr over 30 Minutes Intravenous Daily 04/11/19 0433 04/11/19 0734   04/11/19 0445  remdesivir 200 mg in sodium chloride 0.9% 250 mL IVPB  Status:  Discontinued     200 mg 580 mL/hr over 30 Minutes Intravenous Once 04/11/19 0433 04/11/19 0551   04/20/2019 2245  remdesivir 200 mg in sodium chloride 0.9% 250 mL IVPB      200 mg 580 mL/hr over 30 Minutes Intravenous Once 05/05/2019 2234 04/11/19 0618       Time Spent in minutes  30   Lala Lund M.D on 04/11/2019 at 10:31 AM  To page go to [email or web address] - password [facility]  [facility] -  Office  [phone]    See all Orders from today for further details    Objective:   Vitals:   04/11/19 0200 04/11/19 0215 04/11/19 0400 04/11/19 0700  BP: (!) 103/54 (!) 97/51 109/65 122/68  Pulse: 78 80 80 79  Resp:   20 20  Temp:   97.6 F (36.4 C) 97.6 F (36.4 C)  TempSrc:   Oral Oral  SpO2: 95% 96% 94% 94%  Weight:      Height:        Wt Readings from Last 3 Encounters:  05/05/2019 73.5 kg  03/29/19 73.5 kg  02/04/19 75.1 kg     Intake/Output Summary (Last 24 hours) at 04/11/2019 1031 Last data filed at 04/11/2019 0510 Gross per 24 hour  Intake 0 ml  Output --  Net 0 ml     Physical Exam  Awake Alert,   No new F.N deficits, Normal affect [place].AT,PERRAL Supple Neck,No JVD, No cervical lymphadenopathy appriciated.  Symmetrical Chest wall movement, Good air movement bilaterally, ++ rales RRR,No Gallops,Rubs or new Murmurs, No Parasternal Heave +ve B.Sounds, Abd Soft, No tenderness, No organomegaly appriciated, No rebound - guarding or rigidity. No Cyanosis, Clubbing or edema, No new Rash or bruise       Data Review:    CBC Recent Labs  Lab 04/15/2019 1633 05/04/2019 1918 04/11/19 0724  WBC 18.0*  --  13.3*  HGB 12.2 12.2 11.1*  HCT 36.1 36.0 33.7*  PLT 458*  --  419*  MCV 92.3  --  91.3  MCH 31.2  --  30.1  MCHC 33.8  --  32.9  RDW 14.0  --  13.9  LYMPHSABS 1.5  --  1.2  MONOABS 1.0  --  0.6  EOSABS 0.0  --  0.0  BASOSABS 0.0  --  0.0    Chemistries  Recent Labs  Lab 04/07/2019 1633 05/01/2019 1918 04/11/19 0724  NA 132* 131* 132*  K 4.9 4.7 4.6  CL 98  --  96*  CO2 24  --  22  GLUCOSE 133*  --  143*  BUN 46*  --  61*  CREATININE 3.09*  --  3.64*  CALCIUM 8.4*  --  8.1*  MG 1.8  --  1.9  AST 51*  --  46*   ALT 30  --  28  ALKPHOS 112  --  85  BILITOT 1.3*  --  1.1   ------------------------------------------------------------------------------------------------------------------ Recent Labs    04/07/2019 1639  TRIG 96    Lab Results  Component Value Date   HGBA1C 5.5 03/23/2018   ------------------------------------------------------------------------------------------------------------------ No results for input(s): TSH, T4TOTAL, T3FREE, THYROIDAB in the last 72 hours.  Invalid input(s): FREET3  Cardiac Enzymes No results for input(s): CKMB, TROPONINI, MYOGLOBIN in the last 168 hours.  Invalid input(s): CK ------------------------------------------------------------------------------------------------------------------    Component Value Date/Time   BNP 771.0 (H) 04/11/2019 0724    Micro Results Recent Results (from the past 240 hour(s))  SARS CORONAVIRUS 2 (TAT 6-24 HRS) Nasopharyngeal Nasopharyngeal Swab     Status: Abnormal   Collection Time: 04/21/2019  7:04 PM   Specimen: Nasopharyngeal Swab  Result Value Ref Range Status   SARS Coronavirus 2 POSITIVE (A) NEGATIVE Final    Comment: RESULT CALLED TO, READ BACK BY AND VERIFIED WITH: B.BECK,RN 0046 04/11/19 G.MCADOO (NOTE) SARS-CoV-2 target nucleic acids are DETECTED. The SARS-CoV-2 RNA is generally detectable in upper and lower respiratory specimens during the acute phase of infection. Positive results are indicative of the presence of SARS-CoV-2 RNA. Clinical correlation with patient history and other diagnostic information is  necessary to determine patient infection status. Positive results do not rule out bacterial infection or co-infection with other viruses.  The expected result is Negative. Fact Sheet for Patients: SugarRoll.be Fact Sheet for Healthcare Providers: https://www.woods-mathews.com/ This test is not yet approved or cleared by the [place] FDA and  has  been authorized for detection and/or diagnosis of SARS-CoV-2 by FDA under an Emergency Use Authorization (EUA). This EUA will remain  in effect (meaning this test can be used) for the dura tion of the COVID-19 declaration under Section 564(b)(1) of the Act, 21 U.S.C. section 360bbb-3(b)(1), unless the authorization is terminated or revoked sooner. Performed at [facility], [place] [address], [place], [place] [address]     Radiology Reports CT Head Wo Contrast  Result Date: 03/29/2019 CLINICAL DATA:  Poly trauma, fell this evening striking head on floor; past history of breast cancer, hypertension, diabetes mellitus, paroxysmal atrial fibrillation EXAM: CT HEAD WITHOUT CONTRAST CT CERVICAL SPINE WITHOUT CONTRAST TECHNIQUE: Multidetector CT imaging of the head and cervical spine was performed following the standard protocol without intravenous contrast. Multiplanar CT image reconstructions of the cervical spine were also generated. COMPARISON:  CT head 03/09/2018, MRI cervical spine 09/10/2007 FINDINGS: CT HEAD FINDINGS Brain: Generalized atrophy. Normal ventricular morphology. No midline shift or mass effect. Mild small vessel chronic ischemic changes of deep cerebral white matter. Streak artifacts at temporal lobes due to motion. Low-attenuation at LEFT occipital lobe, by sagittal images likely related to same artifacts. No intracranial hemorrhage, mass lesion or definite acute infarction. No extra-axial fluid collections. Vascular: Atherosclerotic calcifications of internal carotid arteries at skull base. Skull: Calvaria intact.  Scattered motion artifacts noted. Sinuses/Orbits: Clear Other: N/A CT CERVICAL SPINE FINDINGS Alignment: Anterolisthesis at C3-C4, unchanged. Remaining alignments normal. Skull base and vertebrae: Osseous demineralization. Skull base intact. Multilevel facet degenerative changes. Multilevel degenerative disc disease changes. Vertebral body heights maintained without  fracture or bone destruction. Ankylosis of C4-C5 facet joints. Encroachment upon C3-C4 and C5-C6 neural foramina bilaterally as well as RIGHT C6-C7. Soft tissues and spinal canal: Prevertebral soft tissues normal thickness. Atherosclerotic calcifications within the carotid systems  bilaterally. BILATERAL carotid bifurcations extend retropharyngeal. Additional atherosclerotic calcifications at aortic arch. Disc levels:  No additional abnormalities Upper chest: Scarring at RIGHT apex. Other: N/A IMPRESSION: Atrophy with small vessel chronic ischemic changes of deep cerebral white matter. No acute intracranial abnormalities identified on exam limited by mild patient motion artifacts. Degenerative disc and facet disease changes of the cervical spine. No acute cervical spine abnormalities. Electronically Signed   By: Lavonia Dana M.D.   On: 03/29/2019 21:05   CT Cervical Spine Wo Contrast  Result Date: 03/29/2019 CLINICAL DATA:  Poly trauma, fell this evening striking head on floor; past history of breast cancer, hypertension, diabetes mellitus, paroxysmal atrial fibrillation EXAM: CT HEAD WITHOUT CONTRAST CT CERVICAL SPINE WITHOUT CONTRAST TECHNIQUE: Multidetector CT imaging of the head and cervical spine was performed following the standard protocol without intravenous contrast. Multiplanar CT image reconstructions of the cervical spine were also generated. COMPARISON:  CT head 03/09/2018, MRI cervical spine 09/10/2007 FINDINGS: CT HEAD FINDINGS Brain: Generalized atrophy. Normal ventricular morphology. No midline shift or mass effect. Mild small vessel chronic ischemic changes of deep cerebral white matter. Streak artifacts at temporal lobes due to motion. Low-attenuation at LEFT occipital lobe, by sagittal images likely related to same artifacts. No intracranial hemorrhage, mass lesion or definite acute infarction. No extra-axial fluid collections. Vascular: Atherosclerotic calcifications of internal carotid  arteries at skull base. Skull: Calvaria intact.  Scattered motion artifacts noted. Sinuses/Orbits: Clear Other: N/A CT CERVICAL SPINE FINDINGS Alignment: Anterolisthesis at C3-C4, unchanged. Remaining alignments normal. Skull base and vertebrae: Osseous demineralization. Skull base intact. Multilevel facet degenerative changes. Multilevel degenerative disc disease changes. Vertebral body heights maintained without fracture or bone destruction. Ankylosis of C4-C5 facet joints. Encroachment upon C3-C4 and C5-C6 neural foramina bilaterally as well as RIGHT C6-C7. Soft tissues and spinal canal: Prevertebral soft tissues normal thickness. Atherosclerotic calcifications within the carotid systems bilaterally. BILATERAL carotid bifurcations extend retropharyngeal. Additional atherosclerotic calcifications at aortic arch. Disc levels:  No additional abnormalities Upper chest: Scarring at RIGHT apex. Other: N/A IMPRESSION: Atrophy with small vessel chronic ischemic changes of deep cerebral white matter. No acute intracranial abnormalities identified on exam limited by mild patient motion artifacts. Degenerative disc and facet disease changes of the cervical spine. No acute cervical spine abnormalities. Electronically Signed   By: Lavonia Dana M.D.   On: 03/29/2019 21:05   US Renal  Result Date: 05/03/2019 CLINICAL DATA:  Acute kidney injury EXAM: RENAL / URINARY TRACT ULTRASOUND COMPLETE COMPARISON:  None. FINDINGS: Right Kidney: Renal measurements: 7.9 x 4.2 x 3.9 cm = volume: 65.8 mL. There is increased cortical echogenicity. There is no hydronephrosis. Left Kidney: Renal measurements: 8.7 x 4.7 x 3.6 cm = volume: 77 mL. There is increased cortical echogenicity. There is no hydronephrosis. Bladder: Appears normal for degree of bladder distention. Other: None. IMPRESSION: 1. No acute abnormality. 2. Echogenic kidneys bilaterally which can be seen in patients with medical renal disease. Electronically Signed   By:  Constance Holster M.D.   On: 04/29/2019 23:41   DG Chest Portable 1 View  Result Date: 05/02/2019 CLINICAL DATA:  Hypoxia. EXAM: PORTABLE CHEST 1 VIEW COMPARISON:  01/01/2019 FINDINGS: There are diffuse bilateral hazy ground-glass airspace opacities, greatest within the left lung zone. The heart size is borderline enlarged. Aortic calcifications are noted. There is no pneumothorax or large pleural effusion. There is no acute osseous abnormality. IMPRESSION: Diffuse bilateral ground-glass airspace opacities consistent with the patient's history of viral pneumonia. Electronically Signed   By: Harrell Gave  Green M.D.   On: 04/28/2019 16:26

## 2019-04-11 NOTE — Evaluation (Addendum)
Physical Therapy Evaluation Patient Details Name: Emily Andrews MRN: [id number] DOB: 1934/09/27 Today's Date: 04/11/2019   History of Present Illness  85 y/o female w/ hx of paroxysmal a-fib, HTN, HLD, dysrhythmia, DM, cancer, arthritis, anxiety. Pt had fall and hit head on 12/25 tested + COVID 3 weeks prior (to 1/6) was managing at home but had steady decline in function with coughing, congestion, decreased apetite and ambulation. Admitted with Pneumonia due to severe acute respiratory syndrome coronavirus 2 (SARS-CoV-2).  Clinical Impression   Pt admitted with above diagnosis. PTA living home alone with spouse, was independent at AD level with RW prior to illness. Pt does report having fall at home approx 3 weeks ago but does not fully recall when or circumstances at this time. Pt currently with functional limitations due to the deficits listed below (see PT Problem List). This am pt is fatigued, is able to complete all tasks given but at a slower pace with substantially increased rest breaks. Pt was on 15L/min via HFNC, noted desat to low 70s with activity taking approx 3-52mins to recover back to 90s w/ cues for pursed lip breathing. Completed sit<>stand with mod a and RW, ambulated approx 75ft w/ RW and min a, needed several standing breaks to complete this. Pt will benefit from skilled PT to increase her overall strength, balance and coordination, activity tolerance, independence and safety with mobility to allow discharge to the venue listed below.       Follow Up Recommendations Home health PT    Equipment Recommendations  None recommended by PT    Recommendations for Other Services OT consult     Precautions / Restrictions Precautions Precautions: Fall Precaution Comments: desats with activity Restrictions Weight Bearing Restrictions: No      Mobility  Bed Mobility               General bed mobility comments: Pt sitting in recline4r at therapist  arrival  Transfers Overall transfer level: Needs assistance Equipment used: Rolling walker (2 wheeled) Transfers: Sit to/from [employer] Transfers Sit to Stand: Mod assist Stand pivot transfers: Min assist       General transfer comment: needs lifting assist  Ambulation/Gait Ambulation/Gait assistance: Min assist Gait Distance (Feet): 36 Feet Assistive device: Rolling walker (2 wheeled) Gait Pattern/deviations: Step-through pattern     General Gait Details: very slow with frequent standing rest brealks to take a breath, desat to low 70s with ambulation  Stairs            Wheelchair Mobility    Modified Rankin (Stroke Patients Only)       Balance Overall balance assessment: Needs assistance Sitting-balance support: Feet supported Sitting balance-Leahy Scale: Fair     Standing balance support: During functional activity;Bilateral upper extremity supported Standing balance-Leahy Scale: Fair                               Pertinent Vitals/Pain Pain Assessment: No/denies pain    Home Living Family/patient expects to be discharged to:: Private residence Living Arrangements: Spouse/significant other Available Help at Discharge: Family Type of Home: House Home Access: Ramped entrance     Home Layout: One level Home Equipment: Walker - 2 wheels;Grab bars - toilet;Shower seat;Bedside commode;Cane - single point      Prior Function Level of Independence: Independent with assistive device(s)         Comments: normally independent but since being ill has been needing more and  more assistance     Hand Dominance   Dominant Hand: Right    Extremity/Trunk Assessment   Upper Extremity Assessment Upper Extremity Assessment: Defer to OT evaluation    Lower Extremity Assessment Lower Extremity Assessment: Generalized weakness    Cervical / Trunk Assessment Cervical / Trunk Assessment: Normal  Communication   Communication: HOH   Cognition Arousal/Alertness: Awake/alert Behavior During Therapy: WFL for tasks assessed/performed Overall Cognitive Status: No family/caregiver present to determine baseline cognitive functioning                                 General Comments: seems to be at baseline cognition       General Comments      Exercises Other Exercises Other Exercises: incentive spirometer pulls 764ml Other Exercises: flutter valve    Assessment/Plan    PT Assessment Patient needs continued PT services  PT Problem List Decreased strength;Decreased activity tolerance;Decreased balance;Decreased mobility;Decreased coordination;Decreased safety awareness       PT Treatment Interventions Gait training;DME instruction;Stair training;Functional mobility training;Therapeutic exercise;Therapeutic activities;Balance training;Neuromuscular re-education    PT Goals (Current goals can be found in the Care Plan section)  Acute Rehab PT Goals Patient Stated Goal: to go home PT Goal Formulation: With patient Time For Goal Achievement: 04/25/19 Potential to Achieve Goals: Good    Frequency Min 3X/week   Barriers to discharge Other (comment) home alone with spouse, who was also dx with COVID but not symptomatic    Co-evaluation               AM-PAC PT "6 Clicks" Mobility  Outcome Measure Help needed turning from your back to your side while in a flat bed without using bedrails?: A Little Help needed moving from lying on your back to sitting on the side of a flat bed without using bedrails?: A Little Help needed moving to and from a bed to a chair (including a wheelchair)?: A Lot Help needed standing up from a chair using your arms (e.g., wheelchair or bedside chair)?: A Lot Help needed to walk in hospital room?: A Little Help needed climbing 3-5 steps with a railing? : A Lot 6 Click Score: 15    End of Session Equipment Utilized During Treatment: Oxygen Activity Tolerance:  Patient limited by fatigue;Treatment limited secondary to medical complications (Comment) Patient left: in chair;with call bell/phone within reach Nurse Communication: Mobility status PT Visit Diagnosis: Other abnormalities of gait and mobility (R26.89);Muscle weakness (generalized) (M62.81)    Time: 6767-2094 PT Time Calculation (min) (ACUTE ONLY): 48 min   Charges:   PT Evaluation $PT Eval Moderate Complexity: 1 Mod PT Treatments $Gait Training: 8-22 mins $Therapeutic Activity: 8-22 mins        Horald Chestnut, PT   Delford Field 04/11/2019, 12:21 PM

## 2019-04-12 LAB — CBC WITH DIFFERENTIAL/PLATELET
Abs Immature Granulocytes: 0.09 10*3/uL — ABNORMAL HIGH (ref 0.00–0.07)
Basophils Absolute: 0 10*3/uL (ref 0.0–0.1)
Basophils Relative: 0 %
Eosinophils Absolute: 0 10*3/uL (ref 0.0–0.5)
Eosinophils Relative: 0 %
HCT: 33.5 % — ABNORMAL LOW (ref 36.0–46.0)
Hemoglobin: 11.2 g/dL — ABNORMAL LOW (ref 12.0–15.0)
Immature Granulocytes: 1 %
Lymphocytes Relative: 10 %
Lymphs Abs: 1.1 10*3/uL (ref 0.7–4.0)
MCH: 30.2 pg (ref 26.0–34.0)
MCHC: 33.4 g/dL (ref 30.0–36.0)
MCV: 90.3 fL (ref 80.0–100.0)
Monocytes Absolute: 0.5 10*3/uL (ref 0.1–1.0)
Monocytes Relative: 5 %
Neutro Abs: 9 10*3/uL — ABNORMAL HIGH (ref 1.7–7.7)
Neutrophils Relative %: 84 %
Platelets: 413 10*3/uL — ABNORMAL HIGH (ref 150–400)
RBC: 3.71 MIL/uL — ABNORMAL LOW (ref 3.87–5.11)
RDW: 13.9 % (ref 11.5–15.5)
WBC: 10.7 10*3/uL — ABNORMAL HIGH (ref 4.0–10.5)
nRBC: 0.2 % (ref 0.0–0.2)

## 2019-04-12 LAB — COMPREHENSIVE METABOLIC PANEL
ALT: 57 U/L — ABNORMAL HIGH (ref 0–44)
AST: 96 U/L — ABNORMAL HIGH (ref 15–41)
Albumin: 1.8 g/dL — ABNORMAL LOW (ref 3.5–5.0)
Alkaline Phosphatase: 91 U/L (ref 38–126)
Anion gap: 15 (ref 5–15)
BUN: 75 mg/dL — ABNORMAL HIGH (ref 8–23)
CO2: 20 mmol/L — ABNORMAL LOW (ref 22–32)
Calcium: 8.3 mg/dL — ABNORMAL LOW (ref 8.9–10.3)
Chloride: 97 mmol/L — ABNORMAL LOW (ref 98–111)
Creatinine, Ser: 3.53 mg/dL — ABNORMAL HIGH (ref 0.44–1.00)
GFR calc Af Amer: 13 mL/min — ABNORMAL LOW (ref >60)
GFR calc non Af Amer: 11 mL/min — ABNORMAL LOW (ref >60)
Glucose, Bld: 123 mg/dL — ABNORMAL HIGH (ref 70–99)
Potassium: 4.8 mmol/L (ref 3.5–5.1)
Sodium: 132 mmol/L — ABNORMAL LOW (ref 135–145)
Total Bilirubin: 0.7 mg/dL (ref 0.3–1.2)
Total Protein: 5.6 g/dL — ABNORMAL LOW (ref 6.5–8.1)

## 2019-04-12 LAB — MAGNESIUM: Magnesium: 2.1 mg/dL (ref 1.7–2.4)

## 2019-04-12 LAB — PROCALCITONIN: Procalcitonin: 0.53 ng/mL

## 2019-04-12 LAB — C-REACTIVE PROTEIN: CRP: 13.2 mg/dL — ABNORMAL HIGH (ref <1.0)

## 2019-04-12 LAB — PROTIME-INR
INR: 2 — ABNORMAL HIGH (ref 0.8–1.2)
Prothrombin Time: 22.6 seconds — ABNORMAL HIGH (ref 11.4–15.2)

## 2019-04-12 LAB — C4 COMPLEMENT: Complement C4, Body Fluid: 20 mg/dL (ref 12–38)

## 2019-04-12 LAB — BRAIN NATRIURETIC PEPTIDE: B Natriuretic Peptide: 392.1 pg/mL — ABNORMAL HIGH (ref 0.0–100.0)

## 2019-04-12 LAB — GLUCOSE, CAPILLARY: Glucose-Capillary: 109 mg/dL — ABNORMAL HIGH (ref 70–99)

## 2019-04-12 LAB — C3 COMPLEMENT: C3 Complement: 164 mg/dL (ref 82–167)

## 2019-04-12 LAB — D-DIMER, QUANTITATIVE: D-Dimer, Quant: 2.21 ug/mL-FEU — ABNORMAL HIGH (ref 0.00–0.50)

## 2019-04-12 MED ORDER — FUROSEMIDE 10 MG/ML IJ SOLN
60.0000 mg | Freq: Once | INTRAMUSCULAR | Status: AC
  Administered 2019-04-12: 60 mg via INTRAVENOUS
  Filled 2019-04-12: qty 6

## 2019-04-12 MED ORDER — METHYLPREDNISOLONE SODIUM SUCC 125 MG IJ SOLR
60.0000 mg | Freq: Every day | INTRAMUSCULAR | Status: DC
  Administered 2019-04-12: 60 mg via INTRAVENOUS
  Filled 2019-04-12: qty 2

## 2019-04-12 MED ORDER — APIXABAN 2.5 MG PO TABS
2.5000 mg | ORAL_TABLET | Freq: Two times a day (BID) | ORAL | Status: DC
  Administered 2019-04-12 – 2019-04-14 (×5): 2.5 mg via ORAL
  Filled 2019-04-12 (×6): qty 1

## 2019-04-12 MED ORDER — CEPHALEXIN 500 MG PO CAPS: 500.0000 mg | ORAL_CAPSULE | Freq: Two times a day (BID) | ORAL | Status: DC

## 2019-04-12 MED ORDER — METOLAZONE 2.5 MG PO TABS
2.5000 mg | ORAL_TABLET | Freq: Once | ORAL | Status: AC
  Administered 2019-04-12: 2.5 mg via ORAL
  Filled 2019-04-12: qty 1

## 2019-04-12 MED ORDER — FLECAINIDE ACETATE 50 MG PO TABS
50.0000 mg | ORAL_TABLET | Freq: Two times a day (BID) | ORAL | Status: DC
  Administered 2019-04-12 – 2019-04-20 (×15): 50 mg via ORAL
  Filled 2019-04-12 (×17): qty 1

## 2019-04-12 MED ORDER — CEPHALEXIN 250 MG PO CAPS
250.0000 mg | ORAL_CAPSULE | Freq: Every day | ORAL | Status: AC
  Administered 2019-04-12 – 2019-04-16 (×5): 250 mg via ORAL
  Filled 2019-04-12 (×6): qty 1

## 2019-04-12 NOTE — Consult Note (Addendum)
[facility] Renal Consultation Note  Requesting MD: Lala Lund, MD Indication for Consultation: AKI  Chief complaint: Weakness  HPI: Emily Andrews is a 85 y.o. female with a history of hypertension, diabetes, and anxiety who presented to the hospital with weakness. She she presented to the hospital after having tested positive for Covid COVID-19 about 3 weeks prior to her ED presentation and had been progressively deteriorating at that time.  She had a fall at home.  She apparently denied overt shortness of breath but oxygenation was 70% on arrival per charting and was started on a nonrebreather and 15 L of supplemental oxygen.  Despite shortness of breath with prolonged questioning, she states that she is doing "good" and "ok" but does endorse that breathing is better since admission.  Per her hospitalist, she would not want dialysis.  We discussed this and she confirms that she does not think that she would want dialysis and she states that transportation is one reason why.  She is willing to go ahead and let us put in a Foley catheter.  She states that she does feel some bladder pressure.  Hasn't had much urine output with purewick but states more when she moves around; doesn't think wets the sheets though wears a brief at home. States had recent UTI but doesn't recall what she was given.  Creatinine trends as below.  She was initially felt to be dehydrated and received fluids which were stopped per day team.  Given her significant O2 requirement and concern for overload, the patient received Lasix and metolazone once today.  Work-up is notable for renal ultrasound with no hydro but 7.9 and 8.7 cm kidneys with increased cortical echogenicity.  Note that she also had a supratherapeutic INR of greater than 10 and received vit K.    Due to the nature of this patient's suspected COVID-19 with isolation and in keeping with efforts to prevent the spread of infection and to  conserve personal protective equipment, a physical exam was not personally performed.  Patient's symptoms and exam were discussed in detail with the RN.  A chart review of other providers notes and the patient's lab work as well as review of other pertinent studies was performed.  Exam details from prior documentation were reviewed specifically and confirmed with the bedside nurse.  Location of physician: [facility].  Patient was examined by MD via [facility] with audio and video capabilities.   Creatinine  Date/Time Value Ref Range Status  11/30/2018 03:12 PM 0.89 0.44 - 1.00 mg/dL Final   Creat  Date/Time Value Ref Range Status  12/04/2015 02:45 PM 0.91 0.60 - 0.93 mg/dL Final    Comment:      For patients > or = 83 years of age: The upper reference limit for Creatinine is approximately 13% higher for people identified as African-American.      Creatinine, Ser  Date/Time Value Ref Range Status  04/12/2019 01:15 AM 3.53 (H) 0.44 - 1.00 mg/dL Final  04/11/2019 07:24 AM 3.64 (H) 0.44 - 1.00 mg/dL Final  04/25/2019 04:33 PM 3.09 (H) 0.44 - 1.00 mg/dL Final  03/29/2019 08:07 PM 1.35 (H) 0.44 - 1.00 mg/dL Final  01/11/2019 09:51 AM 0.87 0.44 - 1.00 mg/dL Final  03/23/2018 11:30 AM 0.68 0.44 - 1.00 mg/dL Final  03/15/2016 01:02 AM 0.66 0.44 - 1.00 mg/dL Final  12/14/2015 01:03 PM 0.80 0.44 - 1.00 mg/dL Final  12/14/2015 12:58 PM 0.84 0.44 - 1.00 mg/dL Final  08/18/2014 09:38 AM  0.80 0.40 - 1.20 mg/dL Final  08/06/2014 02:40 PM 0.84 0.40 - 1.20 mg/dL Final  01/28/2014 10:28 AM 0.67 0.50 - 1.10 mg/dL Final  08/09/2013 11:32 AM 0.7 0.4 - 1.2 mg/dL Final  02/13/2013 12:15 PM 0.8 0.4 - 1.2 mg/dL Final  08/31/2012 08:13 AM 0.7 0.4 - 1.2 mg/dL Final  03/02/2012 07:47 AM 0.7 0.4 - 1.2 mg/dL Final  07/04/2011 06:25 AM 0.70 0.50 - 1.10 mg/dL Final  06/28/2011 02:10 AM 0.64 0.50 - 1.10 mg/dL Final  06/27/2011 02:55 PM 0.58 0.50 - 1.10 mg/dL Final  08/18/2010 08:58 AM 0.5 0.4 - 1.2 mg/dL  Final     PMHx:   Past Medical History:  Diagnosis Date  . Anxiety   . Arthritis    a. R>L knees  . Cancer [facility])    Left Breast Cancer  . Diabetes mellitus   . Dysrhythmia   . Environmental allergies   . Hiatal hernia   . Hyperlipidemia    a. statin intolerant - myalgias  . Hypertension   . Paroxysmal atrial fibrillation ([place])    a. Dx in 2004.  Off coumadin x several yrs;  b. Normal Event Monitor 03/2011.    Past Surgical History:  Procedure Laterality Date  . ABDOMINAL HYSTERECTOMY    . BREAST LUMPECTOMY WITH RADIOACTIVE SEED LOCALIZATION Left 01/17/2019   Procedure: LEFT BREAST LUMPECTOMY WITH RADIOACTIVE SEED LOCALIZATION;  Surgeon: Alphonsa Overall, MD;  Location: [place];  Service: General;  Laterality: Left;  . CHOLECYSTECTOMY    . CYSTECTOMY     lower back  . lipoma tumor removed     right shoulder  . ORIF ANKLE FRACTURE Right 03/23/2018   Procedure: OPEN REDUCTION INTERNAL FIXATION (ORIF) ANKLE FRACTURE;  Surgeon: Nicholes Stairs, MD;  Location: [facility];  Service: Orthopedics;  Laterality: Right;    Family Hx:  Family History  Problem Relation Age of Onset  . Liver disease Mother        cirrhosis - died in 91's  . Heart failure Mother   . Stroke Father        died in 32's  . Coronary artery disease Sister        living  . Coronary artery disease Brother        living    Social History:  reports that she has never smoked. She has never used smokeless tobacco. She reports that she does not drink alcohol or use drugs.  Allergies:  Allergies  Allergen Reactions  . Statins Other (See Comments)    Atrophy of leg muscle according to patient.  . Oxycodone-Acetaminophen Other (See Comments)  . Poly Hist Forte [Doxylamine-Phenylephrine]   . Adhesive [Tape] Itching, Rash and Other (See Comments)    Pt allergic to monitor electrodes  . Celebrex [Celecoxib] Other (See Comments)    unknown  . Clindamycin/Lincomycin Rash  . Penicillins Other (See Comments)     Has patient had a PCN reaction causing immediate rash, facial/tongue/throat swelling, SOB or lightheadedness with hypotension: Y Has patient had a PCN reaction causing severe rash involving mucus membranes or skin necrosis: Y Has patient had a PCN reaction that required hospitalization: N Has patient had a PCN reaction occurring within the last 10 years: N If all of the above answers are "NO", then may proceed with Cephalosporin use.   Marland Kitchen Phenergan [Promethazine Hcl] Other (See Comments)    unknown  . Robaxin [Methocarbamol] Other (See Comments)    unknown    Medications: Prior to Admission medications  Medication Sig Start Date End Date Taking? Authorizing Provider  Calcium Carbonate-Vitamin D (CALTRATE 600+D PO) Take 2 tablets by mouth daily.    Yes [provider]  chlorthalidone (HYGROTON) 25 MG tablet Take 1 tablet (25 mg total) by mouth daily. 08/07/14  Yes Larey Dresser, MD  cyanocobalamin 100 MCG tablet Take 100 mcg by mouth daily.    Yes [provider]  ezetimibe (ZETIA) 10 MG tablet Take 10 mg by mouth every evening.  01/24/18  Yes [provider]  flecainide (TAMBOCOR) 50 MG tablet Take 1.5 tablets (75 mg total) by mouth 2 (two) times daily. 12/13/18 12/08/19 Yes Burnell Blanks, MD  fluticasone (FLONASE) 50 MCG/ACT nasal spray Place 2 sprays into both nostrils daily as needed for allergies.  10/01/18  Yes [provider]  losartan (COZAAR) 100 MG tablet Take 100 mg by mouth daily.   Yes [provider]  Magnesium 250 MG TABS Take 250 mg by mouth 2 (two) times daily.  08/12/11  Yes Larey Dresser, MD  metoprolol tartrate (LOPRESSOR) 25 MG tablet Take 25 mg by mouth 2 (two) times daily.  08/12/11  Yes Larey Dresser, MD  Multiple Vitamins-Minerals (CENTRUM PO) Take 1 tablet by mouth daily.     Yes [provider]  Omega-3 Fatty Acids (FISH OIL) 1200 MG CAPS Take 2,400 mg by mouth daily.    Yes [provider]  omeprazole (PRILOSEC) 20 MG capsule Take 1 capsule (20 mg total) by mouth daily. 08/12/11  Yes Larey Dresser, MD  potassium chloride SA (K-DUR) 20 MEQ tablet Take 1 tablet (20 mEq total) by mouth daily. 12/13/18  Yes Burnell Blanks, MD  tamoxifen (NOLVADEX) 20 MG tablet Take 1 tablet (20 mg total) by mouth daily. 02/04/19  Yes Truitt Merle, MD  warfarin (COUMADIN) 5 MG tablet TAKE BY MOUTH AS DIRECTED BY ANTICOAGULATION CLINIC Patient taking differently: Take 2.5-5 mg by mouth. Take 2.5 mg by mouth on Tuesday, Thursday and Saturday, take 5 mg on Monday, Wednesday, Friday and Sunday 12/13/18  Yes Burnell Blanks, MD    I have reviewed the patient's current medications.  Labs:  BMP Latest Ref Rng & Units 04/12/2019 04/11/2019 04/08/2019  Glucose 70 - 99 mg/dL 123(H) 143(H) -  BUN 8 - 23 mg/dL 75(H) 61(H) -  Creatinine 0.44 - 1.00 mg/dL 3.53(H) 3.64(H) -  Sodium 135 - 145 mmol/L 132(L) 132(L) 131(L)  Potassium 3.5 - 5.1 mmol/L 4.8 4.6 4.7  Chloride 98 - 111 mmol/L 97(L) 96(L) -  CO2 22 - 32 mmol/L 20(L) 22 -  Calcium 8.9 - 10.3 mg/dL 8.3(L) 8.1(L) -    Urinalysis    Component Value Date/Time   COLORURINE BROWN (A) 04/11/2019 0734   APPEARANCEUR HAZY (A) 04/11/2019 0734   LABSPEC 1.008 04/11/2019 0734   PHURINE 6.0 04/11/2019 0734   GLUCOSEU NEGATIVE 04/11/2019 0734   HGBUR MODERATE (A) 04/11/2019 0734   BILIRUBINUR NEGATIVE 04/11/2019 [place] 04/11/2019 0734   PROTEINUR 100 (A) 04/11/2019 0734   NITRITE NEGATIVE 04/11/2019 0734   LEUKOCYTESUR NEGATIVE 04/11/2019 0734     ROS:  Pertinent items noted in HPI and remainder of comprehensive ROS otherwise negative.   Physical Exam: Vitals:   04/12/19 1211 04/12/19 1642  BP: (!) 123/59 129/63  Pulse: 97 98  Resp:    Temp: 97.6 F (36.4 C) (!) 97.5 F (36.4 C)  SpO2: (!) 88% (!) 88%     General: elderly female  in NAD at rest; increased work of breathing with moving and with speech HEENT:  NCAT Eyes: EOMI sclera anicteric Heart: tachycardic per vitals above Lungs: crackles earlier per MD; now diminished without crackles per nursing and on 8 liters high flow nasal cannula   Abdomen: Soft and nontender.  Patient reports does have some pressure Extremities: no pitting edema per nursing exam; per FaceTime exam, trace edema Neuro: alert and oriented x 3  Psych -normal mood and affect GU purewick with minimal amount of urine per nursing  Assessment/Plan:  # AKI -Secondary to COVID-19 infection.  Setting of RAAS blockade as well and may have had pre-renal insults with infection which progressed to ATN.  Feels pressure in bladder - could have retention as well - Agree with stopping fluids and diuretics given resp status - Defer additional diuretics tonight given improvement in respiratory status  - She doesn't think that she would want chronic dialysis - citing transportation - Conservative management at this time - Feels bladder pressure - will place foley  # COVID-19 PNA - Therapies per primary team  # Acute hypoxic respiratory failure - Supplemental oxygen per primary team - Secondary at least in part to COVID-19 - s/p lasix and metolazone - If needed urgently, acceptable to redose lasix - oxygen requirement is improving  # Paroxysmal atrial fibrillation - Anticoagulation is off given supratherapeutic INR  # Anemia - normocytic  - mild and no indication for ESA  Claudia Desanctis 04/12/2019, 9:27 PM

## 2019-04-12 NOTE — Evaluation (Signed)
Occupational Therapy Evaluation Patient Details Name: Emily Andrews MRN: [id number] DOB: 26-Feb-1935 Today's Date: 04/12/2019    History of Present Illness 85 y/o female w/ hx of paroxysmal a-fib, HTN, HLD, dysrhythmia, DM, cancer, arthritis, anxiety. Pt had fall and hit head on 12/25 tested + COVID 3 weeks prior (to 1/6) was managing at home but had steady decline in function with coughing, congestion, decreased apetite and ambulation. Admitted with Pneumonia due to severe acute respiratory syndrome coronavirus 2 (SARS-CoV-2).   Clinical Impression   PTA, pt was living with her husband and was performing BADLs; husband performing IADLs. Pt reports with recent fall and illness, she has requires assistance due to fatigue and weakness. Pt currently requiring Mod-Max A for LB ADLs and Min A for functional mobility with RW. Pt SpO2 dropping to 60-70s on 8L O2 (O2 monitored on finger); elevated to 15L O2 and switched to [place] for O2 monitor. Pt continues to maintain SpO2 in 70s and denies significant dyspnea. Notified RN and returned to bed. Pt requiring 15L via HFNC, significant time (~10 minutes), and supine rest break to elevated SpO2 to 80s. Pt would benefit from further acute OT to facilitate safe dc. Pending pt's medical progress, recommend dc to home with [place] for further OT to optimize safety, independence with ADLs, and return to PLOF.       Follow Up Recommendations  Home health OT;Supervision/Assistance - 24 hour(Pending pt progress, may need SNF)    Equipment Recommendations  None recommended by OT    Recommendations for Other Services PT consult     Precautions / Restrictions Precautions Precautions: Fall;Other (comment) Precaution Comments: Watch O2      Mobility Bed Mobility Overal bed mobility: Needs Assistance Bed Mobility: Supine to Sit     Supine to sit: Min assist     General bed mobility comments: Min A for pt to hold OT's hand and pull into  sitting  Transfers Overall transfer level: Needs assistance Equipment used: Rolling walker (2 wheeled) Transfers: Sit to/from Stand Sit to Stand: Min assist;Mod assist         General transfer comment: Min A to power up from EOB and elevated surface. Mod A from [facility]    Balance Overall balance assessment: Needs assistance Sitting-balance support: No upper extremity supported;Feet supported Sitting balance-Leahy Scale: Fair     Standing balance support: During functional activity;Bilateral upper extremity supported Standing balance-Leahy Scale: Poor Standing balance comment: Reliant on UE support                           ADL either performed or assessed with clinical judgement   ADL Overall ADL's : Needs assistance/impaired Eating/Feeding: Set up;Sitting   Grooming: Oral care;Sitting;Minimal assistance Grooming Details (indicate cue type and reason): Pt performing oral care at sink while seated. SpO2 maintaining in 70s on 15L via HFNC. Notified RN. Min A for opening containers Upper Body Bathing: Supervision/ safety;Set up;Sitting   Lower Body Bathing: Moderate assistance;Sit to/from stand Lower Body Bathing Details (indicate cue type and reason): Pt able to clean thighs and front peri area. Assistance for cleaning back peri area. Upper Body Dressing : Set up;Supervision/safety;Sitting   Lower Body Dressing: Maximal assistance;Sit to/from stand   Toilet Transfer: Minimal assistance;Ambulation;BSC;RW Toilet Transfer Details (indicate cue type and reason): Min A for power up Toileting- Clothing Manipulation and Hygiene: Moderate assistance;Sit to/from stand       Functional mobility during ADLs: Minimal assistance;Rolling walker General ADL  Comments: Pt presenting with poor activity tolerance with SpO2 dropping to 70s with movement and requires supine rest break, significant time (~10 minutes) and 15L O2 to return to 80s.      Vision         Perception      Praxis      Pertinent Vitals/Pain Pain Assessment: No/denies pain     Hand Dominance Right   Extremity/Trunk Assessment Upper Extremity Assessment Upper Extremity Assessment: Generalized weakness(decreased fine motor)   Lower Extremity Assessment Lower Extremity Assessment: Defer to PT evaluation   Cervical / Trunk Assessment Cervical / Trunk Assessment: Other exceptions(increased body habitus)   Communication Communication Communication: HOH   Cognition Arousal/Alertness: Awake/alert Behavior During Therapy: WFL for tasks assessed/performed Overall Cognitive Status: Impaired/Different from baseline Area of Impairment: Following commands;Problem solving                       Following Commands: Follows one step commands with increased time     Problem Solving: Slow processing;Requires verbal cues General Comments: Pt requiring increased time and cues. Following cues for performance simple ADLs. Feel she is close to baseline cognition.   General Comments  SpO2 dropping to 70s with movement on 8L via HFNC. elevated to 15L. Continued to maintain at 19s. notified RN. monitored wiht [place]    Exercises Exercises: Other exercises Other Exercises Other Exercises: Flutter valve for 5 reps   Shoulder Instructions      Home Living Family/patient expects to be discharged to:: Private residence Living Arrangements: Spouse/significant other Available Help at Discharge: Family Type of Home: House Home Access: Ramped entrance     Home Layout: One level     Bathroom Shower/Tub: Teacher, early years/pre: Standard Bathroom Accessibility: Yes   Home Equipment: Walker - 2 wheels;Grab bars - toilet;Shower seat;Bedside commode;Cane - single point          Prior Functioning/Environment Level of Independence: Independent        Comments: Prior to getting sick, pt was performing BADLs. HUsband performs IADLs        OT Problem List: Decreased  strength;Decreased range of motion;Decreased activity tolerance;Impaired balance (sitting and/or standing);Decreased knowledge of use of DME or AE;Cardiopulmonary status limiting activity;Decreased knowledge of precautions      OT Treatment/Interventions: Self-care/ADL training;Therapeutic exercise;Energy conservation;DME and/or AE instruction;Therapeutic activities;Patient/family education    OT Goals(Current goals can be found in the care plan section) Acute Rehab OT Goals Patient Stated Goal: to go home OT Goal Formulation: With patient Time For Goal Achievement: 04/26/19 Potential to Achieve Goals: Good  OT Frequency: Min 2X/week   Barriers to D/C:            Co-evaluation              AM-PAC OT "6 Clicks" Daily Activity     Outcome Measure Help from another person eating meals?: None Help from another person taking care of personal grooming?: A Little Help from another person toileting, which includes using toliet, bedpan, or urinal?: A Lot Help from another person bathing (including washing, rinsing, drying)?: A Lot Help from another person to put on and taking off regular upper body clothing?: A Little Help from another person to put on and taking off regular lower body clothing?: A Lot 6 Click Score: 16   End of Session Equipment Utilized During Treatment: Oxygen;Rolling walker Nurse Communication: Mobility status;Other (comment)(SpO2)  Activity Tolerance: Other (comment)(Limited by vitals) Patient left: in bed;with call bell/phone  within reach;with nursing/sitter in room  OT Visit Diagnosis: Unsteadiness on feet (R26.81);Other abnormalities of gait and mobility (R26.89);Muscle weakness (generalized) (M62.81)                Time: 7900-9200 OT Time Calculation (min): 59 min Charges:  OT General Charges $OT Visit: 1 Visit OT Evaluation $OT Eval Moderate Complexity: 1 Mod OT Treatments $Self Care/Home Management : 38-52 mins  Pegi Milazzo MSOT, OTR/L Acute  Rehab Pager: [phone] Office: [place] 04/12/2019, 5:04 PM

## 2019-04-12 NOTE — TOC Initial Note (Signed)
Transition of Care [facility]) - Initial/Assessment Note    Patient Details  Name: Emily Andrews MRN: [id number] Date of Birth: February 09, 1935  [facility] CM/SW Contact:    Shade Flood, LCSW Phone Number: 04/12/2019, 12:08 PM  Clinical Narrative:                  Pt from home with her husband. Prior to admission pt was independent in ADLs. PT recommending HH PT at dc. Awaiting OT eval.  DC timeframe not yet known. Will refer for [facility] and assist with any other dc needs once pt is closer to dc.  Expected Discharge Plan: [place] Barriers to Discharge: Continued Medical Work up   Patient Goals and CMS Choice        Expected Discharge Plan and Services Expected Discharge Plan: [place] In-house Referral: Clinical Social Work     Living arrangements for the past 2 months: [place]                                      Prior Living Arrangements/Services Living arrangements for the past 2 months: Single Family Home Lives with:: Spouse Patient language and need for interpreter reviewed:: Yes Do you feel safe going back to the place where you live?: Yes      Need for Family Participation in Patient Care: Yes (Comment) Care giver support system in place?: Yes (comment)   Criminal Activity/Legal Involvement Pertinent to Current Situation/Hospitalization: No - Comment as needed  Activities of Daily Living Home Assistive Devices/Equipment: None ADL Screening (condition at time of admission) Patient's cognitive ability adequate to safely complete daily activities?: Yes Is the patient deaf or have difficulty hearing?: No Does the patient have difficulty seeing, even when wearing glasses/contacts?: No Does the patient have difficulty concentrating, remembering, or making decisions?: No Patient able to express need for assistance with ADLs?: Yes Does the patient have difficulty dressing or bathing?:  No Independently performs ADLs?: Yes (appropriate for developmental age) Does the patient have difficulty walking or climbing stairs?: Yes Weakness of Legs: Both Weakness of Arms/Hands: None  Permission Sought/Granted                  Emotional Assessment       Orientation: : Oriented to Self, Oriented to Place, Oriented to Situation Alcohol / Substance Use: Not Applicable Psych Involvement: No (comment)  Admission diagnosis:  SOB (shortness of breath) [R06.02] SIRS (systemic inflammatory response syndrome) (HCC) [R65.10] Acute respiratory failure with hypoxia ([place]) [J96.01] AKI (acute kidney injury) ([place]) [N17.9] Supratherapeutic INR [R79.1] Pneumonia due to severe acute respiratory syndrome coronavirus 2 (SARS-CoV-2) [U07.1, J12.82] COVID-19 [U07.1] Patient Active Problem List   Diagnosis Date Noted  . Pneumonia due to severe acute respiratory syndrome coronavirus 2 (SARS-CoV-2) 04/26/2019  . Carcinoma of upper-outer quadrant of left breast in female, estrogen receptor positive ([place]) 11/27/2018  . Closed displaced trimalleolar fracture of right ankle 03/23/2018  . Encounter for therapeutic drug monitoring 05/20/2013  . Long term (current) use of anticoagulants 07/27/2011  . Atrial fibrillation with rapid ventricular response ([place]) 06/27/2011  . Elevated LFTs 02/16/2011  . Hypertension 08/18/2010  . Atrial fibrillation ([place]) 08/18/2010  . Hypercholesterolemia 08/18/2010  . Degenerative arthritis of cervical spine 08/18/2010   PCP:  Orpah Melter, MD Pharmacy:   [facility] [place], [place]  [place] [address] Phone: [phone] Fax: [phone]  [id number] - [place], [place] - [address] [place] [address] Phone: [phone] Fax: [phone]     Social Determinants of Health (SDOH) Interventions     Readmission Risk Interventions Readmission Risk Prevention Plan 04/12/2019  Transportation Screening Complete  Medication Review Press photographer) Complete  Some recent data might be hidden

## 2019-04-12 NOTE — Progress Notes (Signed)
ANTICOAGULATION CONSULT NOTE - Initial Consult  Pharmacy Consult for Eliquis Indication: atrial fibrillation  Allergies  Allergen Reactions  . Statins Other (See Comments)    Atrophy of leg muscle according to patient.  . Oxycodone-Acetaminophen Other (See Comments)  . Poly Hist Forte [Doxylamine-Phenylephrine]   . Adhesive [Tape] Itching, Rash and Other (See Comments)    Pt allergic to monitor electrodes  . Celebrex [Celecoxib] Other (See Comments)    unknown  . Clindamycin/Lincomycin Rash  . Penicillins Other (See Comments)    Has patient had a PCN reaction causing immediate rash, facial/tongue/throat swelling, SOB or lightheadedness with hypotension: Y Has patient had a PCN reaction causing severe rash involving mucus membranes or skin necrosis: Y Has patient had a PCN reaction that required hospitalization: N Has patient had a PCN reaction occurring within the last 10 years: N If all of the above answers are "NO", then may proceed with Cephalosporin use.   Marland Kitchen Phenergan [Promethazine Hcl] Other (See Comments)    unknown  . Robaxin [Methocarbamol] Other (See Comments)    unknown    Patient Measurements: Height: 5\' 5"  (165.1 cm) Weight: 162 lb (73.5 kg) IBW/kg (Calculated) : 57   Vital Signs: Temp: 97.8 F (36.6 C) (01/08 0723) Temp Source: Axillary (01/08 0723) BP: 132/61 (01/08 0723) Pulse Rate: 81 (01/08 0723)  Labs: Recent Labs    04/08/2019 1633 04/07/2019 1645 04/25/2019 1700 04/13/2019 1918 04/11/19 0724 04/12/19 0115 04/12/19 0339  HGB 12.2  --   --  12.2 11.1* 11.2*  --   HCT 36.1  --   --  36.0 33.7* 33.5*  --   PLT 458*  --   --   --  419* 413*  --   APTT  --   --  122*  --   --   --   --   LABPROT  --  >90.0*  --   --  >90.0*  --  22.6*  INR  --  >10.0*  --   --  >10.0*  --  2.0*  CREATININE 3.09*  --   --   --  3.64* 3.53*  --     Estimated Creatinine Clearance: 12.3 mL/min (A) (by C-G formula based on SCr of 3.53 mg/dL (H)).   Medical  History: Past Medical History:  Diagnosis Date  . Anxiety   . Arthritis    a. R>L knees  . Cancer [facility])    Left Breast Cancer  . Diabetes mellitus   . Dysrhythmia   . Environmental allergies   . Hiatal hernia   . Hyperlipidemia    a. statin intolerant - myalgias  . Hypertension   . Paroxysmal atrial fibrillation ([place])    a. Dx in 2004.  Off coumadin x several yrs;  b. Normal Event Monitor 03/2011.    Medications:  Home warfarin dose 5mg  daily except for 2.5mg    Assessment: 85 y/o F with a history of PAF on warfarin admitted with acute COVID-19 pneumonitis. INR > 10 on admission without bleeding. Patient received vitamin K 2.5 mg po 1/6 and 5 mg iv 1/7. Patient started tamoxifen in November which is a major drug interaction/contraindication with warfarin. However, cardiology and oncology were proceeding with warfarin due to patient not being able to afford Xarelto in the past with plans to check INR more frequently. Per oncology, patient will need tamoxifen for 5 year.  After speaking with Dr. Candiss Norse, will convert patient to apixaban.  INR now 2 s/p vitamin K po and  IV. Patient has AKI with SCr of 3.5, SCr ~ 1 at baseline  Per outpatient medication management cardiology RPh, Xarelto attempt was 2013, patient's deductible is $50 for Rx, and copay for Eliquis would be $47 per month.  Plan:  -Initiate apixaban 2.5 mg bid for age > 80 and SCr > 1.5 -F/U SCr and adjust apixaban accordingly -Monitor for s/s of bleeding  Ulice Dash D 04/12/2019,8:09 AM

## 2019-04-12 NOTE — Progress Notes (Signed)
PROGRESS NOTE                                                                                                                                                                                                             Patient Demographics:    Emily Andrews, is a 85 y.o. female, DOB - May 17, 1934, OZY:[id number]  Outpatient Primary MD for the patient is Orpah Melter, MD    LOS - 2  Admit date - 05/03/2019    Chief Complaint  Patient presents with   Shortness of Breath    Covid+       Brief Narrative   85 year old Caucasian female with past medical history significant for proximal atrial fibrillation, [place] vas 2 score of at least 3 on Coumadin, hypertension, hyperlipidemia and diabetes mellitus.  Apparently, patient tested positive for COVID-19 virus infection about 3 weeks ago, she presented with shortness of breath was diagnosed with acute hypoxic respiratory failure due to COVID-19 pneumonia and admitted to hospital.   Subjective:   Patient sitting in recliner denies any headache, no chest or abdominal pain.  Shortness of breath is slightly improved.   Assessment  & Plan :      1. Acute Hypoxic Resp. Failure due to Acute Covid 19 Viral Pneumonitis during the ongoing 2020 Covid 19 Pandemic - she has severe disease, she has been placed on IV steroids along with remdesivir however currently requiring 15 L of oxygen, CRP is elevated, will right away use Actemra.  She and her daughter have consented for the same.  Condition is extremely tenuous.  Post Actemra she has shown some improvement and now down to 8-10 L of oxygen from 15.  We will continue to monitor closely.  Encouraged the patient to sit up in chair in the daytime use I-S and flutter valve for pulmonary toiletry and then prone in bed when at night.    Recent Labs  Lab 04/16/2019 1633 04/07/2019 1700 04/27/2019 1904 04/11/19 0724 04/12/19 0115  CRP  --  21.0*   --  18.9* 13.2*  DDIMER 1.50*  --   --  1.20* 2.21*  FERRITIN  --  640*  --   --   --   BNP  --   --   --  771.0* 392.1*  PROCALCITON 1.10  --   --   --   --  SARSCOV2NAA  --   --  POSITIVE*  --   --     Hepatic Function Latest Ref Rng & Units 04/12/2019 04/11/2019 04/06/2019  Total Protein 6.5 - 8.1 g/dL 5.6(L) 6.0(L) 8.1  Albumin 3.5 - 5.0 g/dL 1.8(L) 2.0(L) 1.7(L)  AST 15 - 41 U/L 96(H) 46(H) 51(H)  ALT 0 - 44 U/L 57(H) 28 30  Alk Phosphatase 38 - 126 U/L 91 85 112  Total Bilirubin 0.3 - 1.2 mg/dL 0.7 1.1 1.3(H)  Bilirubin, Direct 0.0 - 0.3 mg/dL - - -     2.  Acute on chronic diastolic CHF EF 30% on echocardiogram done in 2019-IV Lasix 60 mg on 04/11/2019.  3.  AKI -could be ATN due to recent infection, was also on ARB.  Renal ultrasound nonacute but does show chronic kidney disease.  Last creatinine was under 1.2 in 2019, for now Lasix, creatinine has actually worsened after IV fluid restriction since admission.  Creatinine somewhat improved with Lasix which will be continued, also renal consulted on 04/12/2019.  4.  Paroxysmal atrial fibrillation.  [place] vas 2 score was greater than 3, she was on Coumadin with erratic INR control, when she was admitted her INR was greater than 10, Coumadin has been reversed with vitamin K IV, INR stable will be transitioned to Eliquis now.   5.? UTI.  Oral Keflex.    Condition - Extremely Guarded  Family Communication  :  Daughter 1/7  Code Status :  DNR  Diet :   Diet Order            Diet heart healthy/carb modified Room service appropriate? Yes; Fluid consistency: Thin  Diet effective now               Disposition Plan  :  TBD  Consults  :  None  Procedures  :    Renal US - non acute  PUD Prophylaxis : PPI  DVT Prophylaxis  : Coumadin  Lab Results  Component Value Date   INR 2.0 (H) 04/12/2019   INR >10.0 (HH) 04/11/2019   INR >10.0 (HH) 04/11/2019     Lab Results  Component Value Date   PLT 413 (H) 04/12/2019     Inpatient Medications  Scheduled Meds:  apixaban  2.5 mg Oral BID   vitamin C  500 mg Oral Daily   cephALEXin  250 mg Oral Daily   ezetimibe  10 mg Oral QPM   flecainide  75 mg Oral BID   folic acid  1 mg Oral Daily   Ipratropium-Albuterol  1 puff Inhalation Q6H   multivitamin with minerals  1 tablet Oral Daily   pantoprazole  40 mg Oral BID   tamoxifen  20 mg Oral Daily   thiamine  100 mg Oral Daily   cyanocobalamin  100 mcg Oral Daily   zinc sulfate  220 mg Oral Daily   Continuous Infusions:  remdesivir 100 mg in NS 100 mL     PRN Meds:.fluticasone  Antibiotics  :    Anti-infectives (From admission, onward)   Start     Dose/Rate Route Frequency Ordered Stop   04/12/19 1000  remdesivir 100 mg in sodium chloride 0.9 % 100 mL IVPB  Status:  Discontinued     100 mg 200 mL/hr over 30 Minutes Intravenous Daily 04/11/19 0433 04/11/19 0446   04/12/19 1000  remdesivir 100 mg in sodium chloride 0.9 % 100 mL IVPB     100 mg 200 mL/hr over 30 Minutes  Intravenous Daily 04/11/19 0447 04/16/19 0959   04/12/19 0800  cephALEXin (KEFLEX) capsule 250 mg     250 mg Oral Daily 04/12/19 0753     04/12/19 0745  cephALEXin (KEFLEX) capsule 500 mg  Status:  Discontinued     500 mg Oral Every 12 hours 04/12/19 0736 04/12/19 0751   04/11/19 1000  remdesivir 100 mg in sodium chloride 0.9 % 100 mL IVPB  Status:  Discontinued     100 mg 200 mL/hr over 30 Minutes Intravenous Daily 04/25/2019 2234 04/11/19 0447   04/11/19 0500  azithromycin (ZITHROMAX) 500 mg in sodium chloride 0.9 % 250 mL IVPB  Status:  Discontinued     500 mg 250 mL/hr over 60 Minutes Intravenous Daily 04/11/19 0433 04/11/19 0734   04/11/19 0500  cefTRIAXone (ROCEPHIN) 1 g in sodium chloride 0.9 % 100 mL IVPB  Status:  Discontinued     1 g 200 mL/hr over 30 Minutes Intravenous Daily 04/11/19 0433 04/11/19 0734   04/11/19 0445  remdesivir 200 mg in sodium chloride 0.9% 250 mL IVPB  Status:  Discontinued     200  mg 580 mL/hr over 30 Minutes Intravenous Once 04/11/19 0433 04/11/19 0551   04/12/2019 2245  remdesivir 200 mg in sodium chloride 0.9% 250 mL IVPB     200 mg 580 mL/hr over 30 Minutes Intravenous Once 04/24/2019 2234 04/11/19 0618       Time Spent in minutes  30   Lala Lund M.D on 04/12/2019 at 9:36 AM  To page go to [email or web address] - password [email or web address]  [facility] -  Office  [phone]    See all Orders from today for further details    Objective:   Vitals:   04/11/19 1600 04/11/19 2005 04/12/19 0400 04/12/19 0723  BP: 118/66 131/65 114/60 132/61  Pulse: 86 94 81 81  Resp: '20 20 18 16  ' Temp: 97.6 F (36.4 C) 98 F (36.7 C) 97.9 F (36.6 C) 97.8 F (36.6 C)  TempSrc: Oral Oral Oral Axillary  SpO2:  90% 92% (!) 89%  Weight:      Height:        Wt Readings from Last 3 Encounters:  04/26/2019 73.5 kg  03/29/19 73.5 kg  02/04/19 75.1 kg     Intake/Output Summary (Last 24 hours) at 04/12/2019 0936 Last data filed at 04/11/2019 1800 Gross per 24 hour  Intake --  Output 350 ml  Net -350 ml     Physical Exam  Awake Alert, No new F.N deficits, Normal affect [place].AT,PERRAL Supple Neck,No JVD, No cervical lymphadenopathy appriciated.  Symmetrical Chest wall movement, Good air movement bilaterally, diffuse bibasilar Rales RRR,No Gallops, Rubs or new Murmurs, No Parasternal Heave +ve B.Sounds, Abd Soft, No tenderness, No organomegaly appriciated, No rebound - guarding or rigidity. No Cyanosis, Clubbing or edema, No new Rash or bruise     Data Review:    CBC Recent Labs  Lab 04/28/2019 1633 04/29/2019 1918 04/11/19 0724 04/12/19 0115  WBC 18.0*  --  13.3* 10.7*  HGB 12.2 12.2 11.1* 11.2*  HCT 36.1 36.0 33.7* 33.5*  PLT 458*  --  419* 413*  MCV 92.3  --  91.3 90.3  MCH 31.2  --  30.1 30.2  MCHC 33.8  --  32.9 33.4  RDW 14.0  --  13.9 13.9  LYMPHSABS 1.5  --  1.2 1.1  MONOABS 1.0  --  0.6 0.5  EOSABS 0.0  --  0.0 0.0  BASOSABS 0.0  --  0.0 0.0     Chemistries  Recent Labs  Lab 04/12/2019 1633 04/05/2019 1918 04/11/19 0724 04/12/19 0115  NA 132* 131* 132* 132*  K 4.9 4.7 4.6 4.8  CL 98  --  96* 97*  CO2 24  --  22 20*  GLUCOSE 133*  --  143* 123*  BUN 46*  --  61* 75*  CREATININE 3.09*  --  3.64* 3.53*  CALCIUM 8.4*  --  8.1* 8.3*  MG 1.8  --  1.9 2.1  AST 51*  --  46* 96*  ALT 30  --  28 57*  ALKPHOS 112  --  85 91  BILITOT 1.3*  --  1.1 0.7   ------------------------------------------------------------------------------------------------------------------ Recent Labs    05/04/2019 1639  TRIG 96    Lab Results  Component Value Date   HGBA1C 5.5 03/23/2018   ------------------------------------------------------------------------------------------------------------------ No results for input(s): TSH, T4TOTAL, T3FREE, THYROIDAB in the last 72 hours.  Invalid input(s): FREET3  Cardiac Enzymes No results for input(s): CKMB, TROPONINI, MYOGLOBIN in the last 168 hours.  Invalid input(s): CK ------------------------------------------------------------------------------------------------------------------    Component Value Date/Time   BNP 392.1 (H) 04/12/2019 0115    Micro Results Recent Results (from the past 240 hour(s))  Blood Culture (routine x 2)     Status: None (Preliminary result)   Collection Time: 04/18/2019  5:13 PM   Specimen: BLOOD RIGHT WRIST  Result Value Ref Range Status   Specimen Description BLOOD RIGHT WRIST  Final   Special Requests   Final    BOTTLES DRAWN AEROBIC AND ANAEROBIC Blood Culture results may not be optimal due to an inadequate volume of blood received in culture bottles   Culture   Final    NO GROWTH 2 DAYS Performed at [facility], [place] [address], [place], [place] [address]    Report Status PENDING  Incomplete  Blood Culture (routine x 2)     Status: None (Preliminary result)   Collection Time: 04/14/2019  5:13 PM   Specimen: BLOOD RIGHT ARM  Result Value Ref Range  Status   Specimen Description BLOOD RIGHT ARM  Final   Special Requests AEROBIC BOTTLE ONLY Blood Culture adequate volume  Final   Culture   Final    NO GROWTH 2 DAYS Performed at [facility], [place] [address] [place], [place] [address]    Report Status PENDING  Incomplete  SARS CORONAVIRUS 2 (TAT 6-24 HRS) Nasopharyngeal Nasopharyngeal Swab     Status: Abnormal   Collection Time: 04/25/2019  7:04 PM   Specimen: Nasopharyngeal Swab  Result Value Ref Range Status   SARS Coronavirus 2 POSITIVE (A) NEGATIVE Final    Comment: RESULT CALLED TO, READ BACK BY AND VERIFIED WITH: B.BECK,RN [id number] 04/11/19 G.MCADOO (NOTE) SARS-CoV-2 target nucleic acids are DETECTED. The SARS-CoV-2 RNA is generally detectable in upper and lower respiratory specimens during the acute phase of infection. Positive results are indicative of the presence of SARS-CoV-2 RNA. Clinical correlation with patient history and other diagnostic information is  necessary to determine patient infection status. Positive results do not rule out bacterial infection or co-infection with other viruses.  The expected result is Negative. Fact Sheet for Patients: SugarRoll.be Fact Sheet for Healthcare Providers: https://www.woods-mathews.com/ This test is not yet approved or cleared by the [place] FDA and  has been authorized for detection and/or diagnosis of SARS-CoV-2 by FDA under an Emergency Use Authorization (EUA). This EUA will remain  in effect (meaning this test can be  used) for the dura tion of the COVID-19 declaration under Section 564(b)(1) of the Act, 21 U.S.C. section 360bbb-3(b)(1), unless the authorization is terminated or revoked sooner. Performed at [facility], [place] [address] [place], [place] [address]     Radiology Reports CT Head Wo Contrast  Result Date: 03/29/2019 CLINICAL DATA:  Poly trauma, fell this evening striking head on floor; past history of  breast cancer, hypertension, diabetes mellitus, paroxysmal atrial fibrillation EXAM: CT HEAD WITHOUT CONTRAST CT CERVICAL SPINE WITHOUT CONTRAST TECHNIQUE: Multidetector CT imaging of the head and cervical spine was performed following the standard protocol without intravenous contrast. Multiplanar CT image reconstructions of the cervical spine were also generated. COMPARISON:  CT head 03/09/2018, MRI cervical spine 09/10/2007 FINDINGS: CT HEAD FINDINGS Brain: Generalized atrophy. Normal ventricular morphology. No midline shift or mass effect. Mild small vessel chronic ischemic changes of deep cerebral white matter. Streak artifacts at temporal lobes due to motion. Low-attenuation at LEFT occipital lobe, by sagittal images likely related to same artifacts. No intracranial hemorrhage, mass lesion or definite acute infarction. No extra-axial fluid collections. Vascular: Atherosclerotic calcifications of internal carotid arteries at skull base. Skull: Calvaria intact.  Scattered motion artifacts noted. Sinuses/Orbits: Clear Other: N/A CT CERVICAL SPINE FINDINGS Alignment: Anterolisthesis at C3-C4, unchanged. Remaining alignments normal. Skull base and vertebrae: Osseous demineralization. Skull base intact. Multilevel facet degenerative changes. Multilevel degenerative disc disease changes. Vertebral body heights maintained without fracture or bone destruction. Ankylosis of C4-C5 facet joints. Encroachment upon C3-C4 and C5-C6 neural foramina bilaterally as well as RIGHT C6-C7. Soft tissues and spinal canal: Prevertebral soft tissues normal thickness. Atherosclerotic calcifications within the carotid systems bilaterally. BILATERAL carotid bifurcations extend retropharyngeal. Additional atherosclerotic calcifications at aortic arch. Disc levels:  No additional abnormalities Upper chest: Scarring at RIGHT apex. Other: N/A IMPRESSION: Atrophy with small vessel chronic ischemic changes of deep cerebral white matter. No  acute intracranial abnormalities identified on exam limited by mild patient motion artifacts. Degenerative disc and facet disease changes of the cervical spine. No acute cervical spine abnormalities. Electronically Signed   By: Lavonia Dana M.D.   On: 03/29/2019 21:05   CT Cervical Spine Wo Contrast  Result Date: 03/29/2019 CLINICAL DATA:  Poly trauma, fell this evening striking head on floor; past history of breast cancer, hypertension, diabetes mellitus, paroxysmal atrial fibrillation EXAM: CT HEAD WITHOUT CONTRAST CT CERVICAL SPINE WITHOUT CONTRAST TECHNIQUE: Multidetector CT imaging of the head and cervical spine was performed following the standard protocol without intravenous contrast. Multiplanar CT image reconstructions of the cervical spine were also generated. COMPARISON:  CT head 03/09/2018, MRI cervical spine 09/10/2007 FINDINGS: CT HEAD FINDINGS Brain: Generalized atrophy. Normal ventricular morphology. No midline shift or mass effect. Mild small vessel chronic ischemic changes of deep cerebral white matter. Streak artifacts at temporal lobes due to motion. Low-attenuation at LEFT occipital lobe, by sagittal images likely related to same artifacts. No intracranial hemorrhage, mass lesion or definite acute infarction. No extra-axial fluid collections. Vascular: Atherosclerotic calcifications of internal carotid arteries at skull base. Skull: Calvaria intact.  Scattered motion artifacts noted. Sinuses/Orbits: Clear Other: N/A CT CERVICAL SPINE FINDINGS Alignment: Anterolisthesis at C3-C4, unchanged. Remaining alignments normal. Skull base and vertebrae: Osseous demineralization. Skull base intact. Multilevel facet degenerative changes. Multilevel degenerative disc disease changes. Vertebral body heights maintained without fracture or bone destruction. Ankylosis of C4-C5 facet joints. Encroachment upon C3-C4 and C5-C6 neural foramina bilaterally as well as RIGHT C6-C7. Soft tissues and spinal canal:  Prevertebral soft tissues normal thickness. Atherosclerotic  calcifications within the carotid systems bilaterally. BILATERAL carotid bifurcations extend retropharyngeal. Additional atherosclerotic calcifications at aortic arch. Disc levels:  No additional abnormalities Upper chest: Scarring at RIGHT apex. Other: N/A IMPRESSION: Atrophy with small vessel chronic ischemic changes of deep cerebral white matter. No acute intracranial abnormalities identified on exam limited by mild patient motion artifacts. Degenerative disc and facet disease changes of the cervical spine. No acute cervical spine abnormalities. Electronically Signed   By: Lavonia Dana M.D.   On: 03/29/2019 21:05   US Renal  Result Date: 04/19/2019 CLINICAL DATA:  Acute kidney injury EXAM: RENAL / URINARY TRACT ULTRASOUND COMPLETE COMPARISON:  None. FINDINGS: Right Kidney: Renal measurements: 7.9 x 4.2 x 3.9 cm = volume: 65.8 mL. There is increased cortical echogenicity. There is no hydronephrosis. Left Kidney: Renal measurements: 8.7 x 4.7 x 3.6 cm = volume: 77 mL. There is increased cortical echogenicity. There is no hydronephrosis. Bladder: Appears normal for degree of bladder distention. Other: None. IMPRESSION: 1. No acute abnormality. 2. Echogenic kidneys bilaterally which can be seen in patients with medical renal disease. Electronically Signed   By: Constance Holster M.D.   On: 04/16/2019 23:41   DG Chest Portable 1 View  Result Date: 04/30/2019 CLINICAL DATA:  Hypoxia. EXAM: PORTABLE CHEST 1 VIEW COMPARISON:  01/01/2019 FINDINGS: There are diffuse bilateral hazy ground-glass airspace opacities, greatest within the left lung zone. The heart size is borderline enlarged. Aortic calcifications are noted. There is no pneumothorax or large pleural effusion. There is no acute osseous abnormality. IMPRESSION: Diffuse bilateral ground-glass airspace opacities consistent with the patient's history of viral pneumonia. Electronically Signed   By:  Constance Holster M.D.   On: 04/08/2019 16:26

## 2019-04-12 NOTE — Consult Note (Signed)
04/12/19-- Cephalexin renally adjusted based on CrCl 12.22ml/min to 250mg  PO Daily.  Kathleene Hazel, [place], Person Staff Pharmacist

## 2019-04-13 LAB — D-DIMER, QUANTITATIVE: D-Dimer, Quant: >20 ug/mL-FEU — ABNORMAL HIGH (ref 0.00–0.50)

## 2019-04-13 LAB — COMPREHENSIVE METABOLIC PANEL
ALT: 48 U/L — ABNORMAL HIGH (ref 0–44)
AST: 50 U/L — ABNORMAL HIGH (ref 15–41)
Albumin: 2 g/dL — ABNORMAL LOW (ref 3.5–5.0)
Alkaline Phosphatase: 101 U/L (ref 38–126)
Anion gap: 14 (ref 5–15)
BUN: 89 mg/dL — ABNORMAL HIGH (ref 8–23)
CO2: 23 mmol/L (ref 22–32)
Calcium: 8.4 mg/dL — ABNORMAL LOW (ref 8.9–10.3)
Chloride: 98 mmol/L (ref 98–111)
Creatinine, Ser: 3.05 mg/dL — ABNORMAL HIGH (ref 0.44–1.00)
GFR calc Af Amer: 16 mL/min — ABNORMAL LOW (ref >60)
GFR calc non Af Amer: 14 mL/min — ABNORMAL LOW (ref >60)
Glucose, Bld: 124 mg/dL — ABNORMAL HIGH (ref 70–99)
Potassium: 4.8 mmol/L (ref 3.5–5.1)
Sodium: 135 mmol/L (ref 135–145)
Total Bilirubin: 0.7 mg/dL (ref 0.3–1.2)
Total Protein: 5.9 g/dL — ABNORMAL LOW (ref 6.5–8.1)

## 2019-04-13 LAB — CBC WITH DIFFERENTIAL/PLATELET
Abs Immature Granulocytes: 0.08 10*3/uL — ABNORMAL HIGH (ref 0.00–0.07)
Basophils Absolute: 0 10*3/uL (ref 0.0–0.1)
Basophils Relative: 0 %
Eosinophils Absolute: 0 10*3/uL (ref 0.0–0.5)
Eosinophils Relative: 0 %
HCT: 37.5 % (ref 36.0–46.0)
Hemoglobin: 12.8 g/dL (ref 12.0–15.0)
Immature Granulocytes: 1 %
Lymphocytes Relative: 10 %
Lymphs Abs: 1.1 10*3/uL (ref 0.7–4.0)
MCH: 30.4 pg (ref 26.0–34.0)
MCHC: 34.1 g/dL (ref 30.0–36.0)
MCV: 89.1 fL (ref 80.0–100.0)
Monocytes Absolute: 0.8 10*3/uL (ref 0.1–1.0)
Monocytes Relative: 7 %
Neutro Abs: 9.5 10*3/uL — ABNORMAL HIGH (ref 1.7–7.7)
Neutrophils Relative %: 82 %
Platelets: 301 10*3/uL (ref 150–400)
RBC: 4.21 MIL/uL (ref 3.87–5.11)
RDW: 14 % (ref 11.5–15.5)
WBC: 11.5 10*3/uL — ABNORMAL HIGH (ref 4.0–10.5)
nRBC: 0 % (ref 0.0–0.2)

## 2019-04-13 LAB — PROCALCITONIN: Procalcitonin: 0.39 ng/mL

## 2019-04-13 LAB — BRAIN NATRIURETIC PEPTIDE: B Natriuretic Peptide: 356.5 pg/mL — ABNORMAL HIGH (ref 0.0–100.0)

## 2019-04-13 LAB — MAGNESIUM: Magnesium: 2.1 mg/dL (ref 1.7–2.4)

## 2019-04-13 LAB — C-REACTIVE PROTEIN: CRP: 7.7 mg/dL — ABNORMAL HIGH (ref <1.0)

## 2019-04-13 MED ORDER — FUROSEMIDE 10 MG/ML IJ SOLN
80.0000 mg | Freq: Two times a day (BID) | INTRAMUSCULAR | Status: AC
  Administered 2019-04-13 (×2): 80 mg via INTRAVENOUS
  Filled 2019-04-13 (×2): qty 8

## 2019-04-13 MED ORDER — METHYLPREDNISOLONE SODIUM SUCC 40 MG IJ SOLR
40.0000 mg | Freq: Every day | INTRAMUSCULAR | Status: DC
  Filled 2019-04-13: qty 1

## 2019-04-13 MED ORDER — METHYLPREDNISOLONE SODIUM SUCC 40 MG IJ SOLR
30.0000 mg | Freq: Every day | INTRAMUSCULAR | Status: DC
  Administered 2019-04-13 – 2019-04-14 (×2): 30 mg via INTRAVENOUS
  Filled 2019-04-13: qty 1

## 2019-04-13 MED ORDER — METOLAZONE 2.5 MG PO TABS
2.5000 mg | ORAL_TABLET | Freq: Once | ORAL | Status: AC
  Administered 2019-04-13: 2.5 mg via ORAL
  Filled 2019-04-13: qty 1

## 2019-04-13 NOTE — Progress Notes (Signed)
PROGRESS NOTE                                                                                                                                                                                                             Patient Demographics:    Emily Andrews, is a 85 y.o. female, DOB - 1934/12/21, VVK:[id number]  Outpatient Primary MD for the patient is Orpah Melter, MD    LOS - 3  Admit date - 05/02/2019    Chief Complaint  Patient presents with  . Shortness of Breath    Covid+       Brief Narrative   85 year old Caucasian female with past medical history significant for proximal atrial fibrillation, [place] vas 2 score of at least 3 on Coumadin, hypertension, hyperlipidemia and diabetes mellitus.  Apparently, patient tested positive for COVID-19 virus infection about 3 weeks ago, she presented with shortness of breath was diagnosed with acute hypoxic respiratory failure due to COVID-19 pneumonia and admitted to hospital.   Subjective:   Patient in bed, appears comfortable, denies any headache, no fever, no chest pain or pressure, improved shortness of breath , no abdominal pain. No focal weakness.   Assessment  & Plan :      1. Acute Hypoxic Resp. Failure due to Acute Covid 19 Viral Pneumonitis during the ongoing 2020 Covid 19 Pandemic - she has severe disease, she has been placed on IV steroids along with remdesivir however currently requiring 15 L of oxygen, CRP is elevated, will right away use Actemra.  She and her daughter have consented for the same.  Condition is extremely tenuous.  Post Actemra she has shown some improvement and now down to 5 L of oxygen from 15.  We will continue to monitor closely.  Encouraged the patient to sit up in chair in the daytime use I-S and flutter valve for pulmonary toiletry and then prone in bed when at night.   SpO2: 94 % O2 Flow Rate (L/min): 5 L/min   Recent Labs  Lab  04/06/2019 1633 05/02/2019 1700 05/02/2019 1904 04/11/19 0724 04/12/19 0115 04/13/19 0404  CRP  --  21.0*  --  18.9* 13.2* 7.7*  DDIMER 1.50*  --   --  1.20* 2.21* >20.00*  FERRITIN  --  640*  --   --   --   --  BNP  --   --   --  771.0* 392.1* 356.5*  PROCALCITON 1.10  --   --   --  0.53 0.39  SARSCOV2NAA  --   --  POSITIVE*  --   --   --     Hepatic Function Latest Ref Rng & Units 04/13/2019 04/12/2019 04/11/2019  Total Protein 6.5 - 8.1 g/dL 5.9(L) 5.6(L) 6.0(L)  Albumin 3.5 - 5.0 g/dL 2.0(L) 1.8(L) 2.0(L)  AST 15 - 41 U/L 50(H) 96(H) 46(H)  ALT 0 - 44 U/L 48(H) 57(H) 28  Alk Phosphatase 38 - 126 U/L 101 91 85  Total Bilirubin 0.3 - 1.2 mg/dL 0.7 0.7 1.1  Bilirubin, Direct 0.0 - 0.3 mg/dL - - -     2.  Acute on chronic diastolic CHF EF 03% on echocardiogram done in 2019-IV Lasix 60 mg on 04/11/2019.  3.  AKI -could be ATN due to recent infection, was also on ARB.  Renal ultrasound nonacute but does show chronic kidney disease.  Last creatinine was under 1.2 in 2019, me clinically she appears to be in fluid overload and she is responding well to diuresis with IV Lasix and Zaroxolyn which will be continued.  Continue to monitor renal function.  4.  Paroxysmal atrial fibrillation.  [place] vas 2 score was greater than 3, she was on Coumadin with erratic INR control, when she was admitted her INR was greater than 10, Coumadin has been reversed with vitamin K IV, INR stable will be transitioned to Eliquis now.   5.? UTI.  Oral Keflex for a total of 3 days.    Condition - Fair  Family Communication  :  Daughter 1/7, 1/9  Code Status :  DNR  Diet :   Diet Order            Diet heart healthy/carb modified Room service appropriate? Yes; Fluid consistency: Thin  Diet effective now               Disposition Plan  :  TBD  Consults  :  None  Procedures  :    Renal US - non acute  PUD Prophylaxis : PPI  DVT Prophylaxis  : Coumadin  Lab Results  Component Value Date   INR 2.0  (H) 04/12/2019   INR >10.0 (HH) 04/11/2019   INR >10.0 (HH) 05/01/2019     Lab Results  Component Value Date   PLT 301 04/13/2019    Inpatient Medications  Scheduled Meds: . apixaban  2.5 mg Oral BID  . vitamin C  500 mg Oral Daily  . cephALEXin  250 mg Oral Daily  . ezetimibe  10 mg Oral QPM  . flecainide  50 mg Oral BID  . folic acid  1 mg Oral Daily  . furosemide  80 mg Intravenous BID  . Ipratropium-Albuterol  1 puff Inhalation Q6H  . methylPREDNISolone (SOLU-MEDROL) injection  40 mg Intravenous Daily  . metolazone  2.5 mg Oral Once  . multivitamin with minerals  1 tablet Oral Daily  . pantoprazole  40 mg Oral BID  . tamoxifen  20 mg Oral Daily  . thiamine  100 mg Oral Daily  . cyanocobalamin  100 mcg Oral Daily  . zinc sulfate  220 mg Oral Daily   Continuous Infusions: . remdesivir 100 mg in NS 100 mL 100 mg (04/12/19 0957)   PRN Meds:.fluticasone  Antibiotics  :    Anti-infectives (From admission, onward)   Start     Dose/Rate Route  Frequency Ordered Stop   04/12/19 1000  remdesivir 100 mg in sodium chloride 0.9 % 100 mL IVPB  Status:  Discontinued     100 mg 200 mL/hr over 30 Minutes Intravenous Daily 04/11/19 0433 04/11/19 0446   04/12/19 1000  remdesivir 100 mg in sodium chloride 0.9 % 100 mL IVPB     100 mg 200 mL/hr over 30 Minutes Intravenous Daily 04/11/19 0447 04/16/19 0959   04/12/19 0800  cephALEXin (KEFLEX) capsule 250 mg     250 mg Oral Daily 04/12/19 0753     04/12/19 0745  cephALEXin (KEFLEX) capsule 500 mg  Status:  Discontinued     500 mg Oral Every 12 hours 04/12/19 0736 04/12/19 0751   04/11/19 1000  remdesivir 100 mg in sodium chloride 0.9 % 100 mL IVPB  Status:  Discontinued     100 mg 200 mL/hr over 30 Minutes Intravenous Daily 05/01/2019 2234 04/11/19 0447   04/11/19 0500  azithromycin (ZITHROMAX) 500 mg in sodium chloride 0.9 % 250 mL IVPB  Status:  Discontinued     500 mg 250 mL/hr over 60 Minutes Intravenous Daily 04/11/19 0433  04/11/19 0734   04/11/19 0500  cefTRIAXone (ROCEPHIN) 1 g in sodium chloride 0.9 % 100 mL IVPB  Status:  Discontinued     1 g 200 mL/hr over 30 Minutes Intravenous Daily 04/11/19 0433 04/11/19 0734   04/11/19 0445  remdesivir 200 mg in sodium chloride 0.9% 250 mL IVPB  Status:  Discontinued     200 mg 580 mL/hr over 30 Minutes Intravenous Once 04/11/19 0433 04/11/19 0551   04/07/2019 2245  remdesivir 200 mg in sodium chloride 0.9% 250 mL IVPB     200 mg 580 mL/hr over 30 Minutes Intravenous Once 05/02/2019 2234 04/11/19 0618       Time Spent in minutes  30   Lala Lund M.D on 04/13/2019 at 10:18 AM  To page go to [email or web address] - password [facility]  [facility] -  Office  [phone]    See all Orders from today for further details    Objective:   Vitals:   04/13/19 0000 04/13/19 0400 04/13/19 0700 04/13/19 0900  BP: (!) 116/59 (!) 126/55 (!) 145/71   Pulse: 96 91 87   Resp: 20     Temp: 97.9 F (36.6 C) 97.9 F (36.6 C) (!) 97.5 F (36.4 C)   TempSrc: Oral Oral Axillary   SpO2: (!) 88% 90% 95% 94%  Weight:      Height:        Wt Readings from Last 3 Encounters:  04/20/2019 73.5 kg  03/29/19 73.5 kg  02/04/19 75.1 kg     Intake/Output Summary (Last 24 hours) at 04/13/2019 1018 Last data filed at 04/13/2019 0425 Gross per 24 hour  Intake 240 ml  Output 600 ml  Net -360 ml     Physical Exam  Awake Alert,  No new F.N deficits, Normal affect [place].AT,PERRAL Supple Neck,No JVD, No cervical lymphadenopathy appriciated.  Symmetrical Chest wall movement, Good air movement bilaterally, CTAB RRR,No Gallops, Rubs or new Murmurs, No Parasternal Heave +ve B.Sounds, Abd Soft, No tenderness, No organomegaly appriciated, No rebound - guarding or rigidity. No Cyanosis, Clubbing or edema, No new Rash or bruise    Data Review:    CBC Recent Labs  Lab 04/24/2019 1633 04/13/2019 1918 04/11/19 0724 04/12/19 0115 04/13/19 0404  WBC 18.0*  --  13.3* 10.7* 11.5*  HGB 12.2  12.2 11.1* 11.2* 12.8  HCT 36.1  36.0 33.7* 33.5* 37.5  PLT 458*  --  419* 413* 301  MCV 92.3  --  91.3 90.3 89.1  MCH 31.2  --  30.1 30.2 30.4  MCHC 33.8  --  32.9 33.4 34.1  RDW 14.0  --  13.9 13.9 14.0  LYMPHSABS 1.5  --  1.2 1.1 1.1  MONOABS 1.0  --  0.6 0.5 0.8  EOSABS 0.0  --  0.0 0.0 0.0  BASOSABS 0.0  --  0.0 0.0 0.0    Chemistries  Recent Labs  Lab 04/28/2019 1633 04/30/2019 1918 04/11/19 0724 04/12/19 0115 04/13/19 0404  NA 132* 131* 132* 132* 135  K 4.9 4.7 4.6 4.8 4.8  CL 98  --  96* 97* 98  CO2 24  --  22 20* 23  GLUCOSE 133*  --  143* 123* 124*  BUN 46*  --  61* 75* 89*  CREATININE 3.09*  --  3.64* 3.53* 3.05*  CALCIUM 8.4*  --  8.1* 8.3* 8.4*  MG 1.8  --  1.9 2.1 2.1  AST 51*  --  46* 96* 50*  ALT 30  --  28 57* 48*  ALKPHOS 112  --  85 91 101  BILITOT 1.3*  --  1.1 0.7 0.7   ------------------------------------------------------------------------------------------------------------------ Recent Labs    05/05/2019 1639  TRIG 96    Lab Results  Component Value Date   HGBA1C 5.5 03/23/2018   ------------------------------------------------------------------------------------------------------------------ No results for input(s): TSH, T4TOTAL, T3FREE, THYROIDAB in the last 72 hours.  Invalid input(s): FREET3  Cardiac Enzymes No results for input(s): CKMB, TROPONINI, MYOGLOBIN in the last 168 hours.  Invalid input(s): CK ------------------------------------------------------------------------------------------------------------------    Component Value Date/Time   BNP 356.5 (H) 04/13/2019 0404    Micro Results Recent Results (from the past 240 hour(s))  Blood Culture (routine x 2)     Status: None (Preliminary result)   Collection Time: 04/14/2019  5:13 PM   Specimen: BLOOD RIGHT WRIST  Result Value Ref Range Status   Specimen Description BLOOD RIGHT WRIST  Final   Special Requests   Final    BOTTLES DRAWN AEROBIC AND ANAEROBIC Blood Culture  results may not be optimal due to an inadequate volume of blood received in culture bottles   Culture   Final    NO GROWTH 3 DAYS Performed at [facility], [place] [address], [place], [place] [address]    Report Status PENDING  Incomplete  Blood Culture (routine x 2)     Status: None (Preliminary result)   Collection Time: 04/07/2019  5:13 PM   Specimen: BLOOD RIGHT ARM  Result Value Ref Range Status   Specimen Description BLOOD RIGHT ARM  Final   Special Requests AEROBIC BOTTLE ONLY Blood Culture adequate volume  Final   Culture   Final    NO GROWTH 3 DAYS Performed at [facility], [place] [address], [place], [place] [address]    Report Status PENDING  Incomplete  SARS CORONAVIRUS 2 (TAT 6-24 HRS) Nasopharyngeal Nasopharyngeal Swab     Status: Abnormal   Collection Time: 04/28/2019  7:04 PM   Specimen: Nasopharyngeal Swab  Result Value Ref Range Status   SARS Coronavirus 2 POSITIVE (A) NEGATIVE Final    Comment: RESULT CALLED TO, READ BACK BY AND VERIFIED WITH: B.BECK,RN [id number] 04/11/19 G.MCADOO (NOTE) SARS-CoV-2 target nucleic acids are DETECTED. The SARS-CoV-2 RNA is generally detectable in upper and lower respiratory specimens during the acute phase of infection. Positive results are indicative  of the presence of SARS-CoV-2 RNA. Clinical correlation with patient history and other diagnostic information is  necessary to determine patient infection status. Positive results do not rule out bacterial infection or co-infection with other viruses.  The expected result is Negative. Fact Sheet for Patients: SugarRoll.be Fact Sheet for Healthcare Providers: https://www.woods-mathews.com/ This test is not yet approved or cleared by the [place] FDA and  has been authorized for detection and/or diagnosis of SARS-CoV-2 by FDA under an Emergency Use Authorization (EUA). This EUA will remain  in effect (meaning this test can be used) for the  dura tion of the COVID-19 declaration under Section 564(b)(1) of the Act, 21 U.S.C. section 360bbb-3(b)(1), unless the authorization is terminated or revoked sooner. Performed at [facility], [place] [address], [place], [place] [address]     Radiology Reports CT Head Wo Contrast  Result Date: 03/29/2019 CLINICAL DATA:  Poly trauma, fell this evening striking head on floor; past history of breast cancer, hypertension, diabetes mellitus, paroxysmal atrial fibrillation EXAM: CT HEAD WITHOUT CONTRAST CT CERVICAL SPINE WITHOUT CONTRAST TECHNIQUE: Multidetector CT imaging of the head and cervical spine was performed following the standard protocol without intravenous contrast. Multiplanar CT image reconstructions of the cervical spine were also generated. COMPARISON:  CT head 03/09/2018, MRI cervical spine 09/10/2007 FINDINGS: CT HEAD FINDINGS Brain: Generalized atrophy. Normal ventricular morphology. No midline shift or mass effect. Mild small vessel chronic ischemic changes of deep cerebral white matter. Streak artifacts at temporal lobes due to motion. Low-attenuation at LEFT occipital lobe, by sagittal images likely related to same artifacts. No intracranial hemorrhage, mass lesion or definite acute infarction. No extra-axial fluid collections. Vascular: Atherosclerotic calcifications of internal carotid arteries at skull base. Skull: Calvaria intact.  Scattered motion artifacts noted. Sinuses/Orbits: Clear Other: N/A CT CERVICAL SPINE FINDINGS Alignment: Anterolisthesis at C3-C4, unchanged. Remaining alignments normal. Skull base and vertebrae: Osseous demineralization. Skull base intact. Multilevel facet degenerative changes. Multilevel degenerative disc disease changes. Vertebral body heights maintained without fracture or bone destruction. Ankylosis of C4-C5 facet joints. Encroachment upon C3-C4 and C5-C6 neural foramina bilaterally as well as RIGHT C6-C7. Soft tissues and spinal canal:  Prevertebral soft tissues normal thickness. Atherosclerotic calcifications within the carotid systems bilaterally. BILATERAL carotid bifurcations extend retropharyngeal. Additional atherosclerotic calcifications at aortic arch. Disc levels:  No additional abnormalities Upper chest: Scarring at RIGHT apex. Other: N/A IMPRESSION: Atrophy with small vessel chronic ischemic changes of deep cerebral white matter. No acute intracranial abnormalities identified on exam limited by mild patient motion artifacts. Degenerative disc and facet disease changes of the cervical spine. No acute cervical spine abnormalities. Electronically Signed   By: Lavonia Dana M.D.   On: 03/29/2019 21:05   CT Cervical Spine Wo Contrast  Result Date: 03/29/2019 CLINICAL DATA:  Poly trauma, fell this evening striking head on floor; past history of breast cancer, hypertension, diabetes mellitus, paroxysmal atrial fibrillation EXAM: CT HEAD WITHOUT CONTRAST CT CERVICAL SPINE WITHOUT CONTRAST TECHNIQUE: Multidetector CT imaging of the head and cervical spine was performed following the standard protocol without intravenous contrast. Multiplanar CT image reconstructions of the cervical spine were also generated. COMPARISON:  CT head 03/09/2018, MRI cervical spine 09/10/2007 FINDINGS: CT HEAD FINDINGS Brain: Generalized atrophy. Normal ventricular morphology. No midline shift or mass effect. Mild small vessel chronic ischemic changes of deep cerebral white matter. Streak artifacts at temporal lobes due to motion. Low-attenuation at LEFT occipital lobe, by sagittal images likely related to same artifacts. No intracranial hemorrhage, mass lesion or definite  acute infarction. No extra-axial fluid collections. Vascular: Atherosclerotic calcifications of internal carotid arteries at skull base. Skull: Calvaria intact.  Scattered motion artifacts noted. Sinuses/Orbits: Clear Other: N/A CT CERVICAL SPINE FINDINGS Alignment: Anterolisthesis at C3-C4,  unchanged. Remaining alignments normal. Skull base and vertebrae: Osseous demineralization. Skull base intact. Multilevel facet degenerative changes. Multilevel degenerative disc disease changes. Vertebral body heights maintained without fracture or bone destruction. Ankylosis of C4-C5 facet joints. Encroachment upon C3-C4 and C5-C6 neural foramina bilaterally as well as RIGHT C6-C7. Soft tissues and spinal canal: Prevertebral soft tissues normal thickness. Atherosclerotic calcifications within the carotid systems bilaterally. BILATERAL carotid bifurcations extend retropharyngeal. Additional atherosclerotic calcifications at aortic arch. Disc levels:  No additional abnormalities Upper chest: Scarring at RIGHT apex. Other: N/A IMPRESSION: Atrophy with small vessel chronic ischemic changes of deep cerebral white matter. No acute intracranial abnormalities identified on exam limited by mild patient motion artifacts. Degenerative disc and facet disease changes of the cervical spine. No acute cervical spine abnormalities. Electronically Signed   By: Lavonia Dana M.D.   On: 03/29/2019 21:05   US Renal  Result Date: 04/25/2019 CLINICAL DATA:  Acute kidney injury EXAM: RENAL / URINARY TRACT ULTRASOUND COMPLETE COMPARISON:  None. FINDINGS: Right Kidney: Renal measurements: 7.9 x 4.2 x 3.9 cm = volume: 65.8 mL. There is increased cortical echogenicity. There is no hydronephrosis. Left Kidney: Renal measurements: 8.7 x 4.7 x 3.6 cm = volume: 77 mL. There is increased cortical echogenicity. There is no hydronephrosis. Bladder: Appears normal for degree of bladder distention. Other: None. IMPRESSION: 1. No acute abnormality. 2. Echogenic kidneys bilaterally which can be seen in patients with medical renal disease. Electronically Signed   By: Constance Holster M.D.   On: 04/12/2019 23:41   DG Chest Portable 1 View  Result Date: 04/20/2019 CLINICAL DATA:  Hypoxia. EXAM: PORTABLE CHEST 1 VIEW COMPARISON:  01/01/2019 FINDINGS:  There are diffuse bilateral hazy ground-glass airspace opacities, greatest within the left lung zone. The heart size is borderline enlarged. Aortic calcifications are noted. There is no pneumothorax or large pleural effusion. There is no acute osseous abnormality. IMPRESSION: Diffuse bilateral ground-glass airspace opacities consistent with the patient's history of viral pneumonia. Electronically Signed   By: Constance Holster M.D.   On: 04/07/2019 16:26

## 2019-04-13 NOTE — Progress Notes (Signed)
[facility] Progress Note   85 y/o woman HTN DM p/w weakness tested COVID+ 3 weeks ago, O2 sat 70% upon arrival. She does not want dialysis chronically. Felt bladder pressure but not able to place foley 1/8 with <100 ml on bladder scan. She rx Lasix and Metolazone on 1/8. U/S neg for hydro but small kidneys w/ incr echogen.  Assessment/ Plan:   1)  AKI -Secondary to COVID-19 infection.  Setting of RAAS blockade as well and may have had pre-renal insults with infection which progressed to ATN.  Feels pressure in bladder - could have retention as well but difficulty trying to place foley. Will rely on qshift bladder scan -> qdaily  - She doesn't think that she would want chronic dialysis - citing transportation - Conservative management at this time - Renal function may be stabilizing; still has e/o volume overload and will continue Lasix 80mg  BID +  low dose Metolazone  2)  COVID-19 PNA - Therapies per primary team  3)  Acute hypoxic respiratory failure - Supplemental oxygen per primary team - Secondary at least in part to COVID-19 - s/p lasix and metolazone  4) Paroxysmal atrial fibrillation - Anticoagulation is off given supratherapeutic INR  5) Anemia - normocytic  - mild and no indication for ESA  Subjective:   She feels that the breathing is improved c/w yesterday, very weak. Denies f/c/n/v/ myalgias   Objective:   BP (!) 145/71 (BP Location: Right Arm)   Pulse 87   Temp (!) 97.5 F (36.4 C) (Axillary)   Resp 20   Ht 5\' 5"  (1.651 m)   Wt 73.5 kg   SpO2 95%   BMI 26.96 kg/m   Intake/Output Summary (Last 24 hours) at 04/13/2019 1011 Last data filed at 04/13/2019 0425 Gross per 24 hour  Intake 240 ml  Output 600 ml  Net -360 ml   Weight change:   Physical Exam: General: elderly female in NAD at rest; frail, able to speak in full sentences Heart: 80-100's Lungs: crackles with decr BS on nasal cannula   Abdomen: Soft and nontender.    Extremities: trace edema Neuro: alert and oriented x 3  Psych: normal mood and affect GU purewick   Imaging: No results found.  Labs: BMET Recent Labs  Lab 04/30/2019 1633 04/07/2019 1918 04/11/19 0724 04/12/19 0115 04/13/19 0404  NA 132* 131* 132* 132* 135  K 4.9 4.7 4.6 4.8 4.8  CL 98  --  96* 97* 98  CO2 24  --  22 20* 23  GLUCOSE 133*  --  143* 123* 124*  BUN 46*  --  61* 75* 89*  CREATININE 3.09*  --  3.64* 3.53* 3.05*  CALCIUM 8.4*  --  8.1* 8.3* 8.4*  PHOS  --   --  5.3*  --   --    CBC Recent Labs  Lab 04/28/2019 1633 04/09/2019 1918 04/11/19 0724 04/12/19 0115 04/13/19 0404  WBC 18.0*  --  13.3* 10.7* 11.5*  NEUTROABS 15.3*  --  11.3* 9.0* 9.5*  HGB 12.2 12.2 11.1* 11.2* 12.8  HCT 36.1 36.0 33.7* 33.5* 37.5  MCV 92.3  --  91.3 90.3 89.1  PLT 458*  --  419* 413* 301    Medications:    . apixaban  2.5 mg Oral BID  . vitamin C  500 mg Oral Daily  . cephALEXin  250 mg Oral Daily  . ezetimibe  10 mg Oral QPM  . flecainide  50 mg Oral BID  .  folic acid  1 mg Oral Daily  . furosemide  80 mg Intravenous BID  . Ipratropium-Albuterol  1 puff Inhalation Q6H  . methylPREDNISolone (SOLU-MEDROL) injection  40 mg Intravenous Daily  . metolazone  2.5 mg Oral Once  . multivitamin with minerals  1 tablet Oral Daily  . pantoprazole  40 mg Oral BID  . tamoxifen  20 mg Oral Daily  . thiamine  100 mg Oral Daily  . cyanocobalamin  100 mcg Oral Daily  . zinc sulfate  220 mg Oral Daily      Otelia Santee, MD 04/13/2019, 10:11 AM

## 2019-04-13 NOTE — Progress Notes (Signed)
Order for foley catheter given.  4 RNs attempted to insert the foley without success.  Patient had over 400cc of output in her purwick this shift.  Charge RN, Gerald Stabs and [place], notified.  Advised to bladder scan patient.  Bladder scan showed 88cc of urine.  Printout placed in paper chart.  Contacted on call MD but no new orders.  Will pass on to day shift nurse to follow up with nephrology about insertion of foley.  Contacted patient's daughter and updated her on plan and consult with Dr. Royce Macadamia, nephrologist prior to attempting to insert foley.

## 2019-04-14 ENCOUNTER — Inpatient Hospital Stay (HOSPITAL_COMMUNITY): Payer: Medicare Other

## 2019-04-14 LAB — CBC WITH DIFFERENTIAL/PLATELET
Abs Immature Granulocytes: 0.06 10*3/uL (ref 0.00–0.07)
Basophils Absolute: 0 10*3/uL (ref 0.0–0.1)
Basophils Relative: 0 %
Eosinophils Absolute: 0.1 10*3/uL (ref 0.0–0.5)
Eosinophils Relative: 1 %
HCT: 40.9 % (ref 36.0–46.0)
Hemoglobin: 13.6 g/dL (ref 12.0–15.0)
Immature Granulocytes: 1 %
Lymphocytes Relative: 16 %
Lymphs Abs: 1.4 10*3/uL (ref 0.7–4.0)
MCH: 30.2 pg (ref 26.0–34.0)
MCHC: 33.3 g/dL (ref 30.0–36.0)
MCV: 90.9 fL (ref 80.0–100.0)
Monocytes Absolute: 0.4 10*3/uL (ref 0.1–1.0)
Monocytes Relative: 5 %
Neutro Abs: 6.9 10*3/uL (ref 1.7–7.7)
Neutrophils Relative %: 77 %
Platelets: 295 10*3/uL (ref 150–400)
RBC: 4.5 MIL/uL (ref 3.87–5.11)
RDW: 14.6 % (ref 11.5–15.5)
WBC: 8.8 10*3/uL (ref 4.0–10.5)
nRBC: 0 % (ref 0.0–0.2)

## 2019-04-14 LAB — C-REACTIVE PROTEIN: CRP: 4.4 mg/dL — ABNORMAL HIGH (ref <1.0)

## 2019-04-14 LAB — COMPREHENSIVE METABOLIC PANEL
ALT: 39 U/L (ref 0–44)
AST: 44 U/L — ABNORMAL HIGH (ref 15–41)
Albumin: 2.2 g/dL — ABNORMAL LOW (ref 3.5–5.0)
Alkaline Phosphatase: 111 U/L (ref 38–126)
Anion gap: 14 (ref 5–15)
BUN: 97 mg/dL — ABNORMAL HIGH (ref 8–23)
CO2: 27 mmol/L (ref 22–32)
Calcium: 8.7 mg/dL — ABNORMAL LOW (ref 8.9–10.3)
Chloride: 96 mmol/L — ABNORMAL LOW (ref 98–111)
Creatinine, Ser: 2.77 mg/dL — ABNORMAL HIGH (ref 0.44–1.00)
GFR calc Af Amer: 18 mL/min — ABNORMAL LOW (ref >60)
GFR calc non Af Amer: 15 mL/min — ABNORMAL LOW (ref >60)
Glucose, Bld: 104 mg/dL — ABNORMAL HIGH (ref 70–99)
Potassium: 4.6 mmol/L (ref 3.5–5.1)
Sodium: 137 mmol/L (ref 135–145)
Total Bilirubin: 1.1 mg/dL (ref 0.3–1.2)
Total Protein: 5.9 g/dL — ABNORMAL LOW (ref 6.5–8.1)

## 2019-04-14 LAB — PROCALCITONIN: Procalcitonin: 0.34 ng/mL

## 2019-04-14 LAB — BRAIN NATRIURETIC PEPTIDE: B Natriuretic Peptide: 162.4 pg/mL — ABNORMAL HIGH (ref 0.0–100.0)

## 2019-04-14 LAB — D-DIMER, QUANTITATIVE: D-Dimer, Quant: 17.22 ug/mL-FEU — ABNORMAL HIGH (ref 0.00–0.50)

## 2019-04-14 LAB — MAGNESIUM: Magnesium: 2 mg/dL (ref 1.7–2.4)

## 2019-04-14 MED ORDER — QUETIAPINE FUMARATE 25 MG PO TABS
25.0000 mg | ORAL_TABLET | Freq: Two times a day (BID) | ORAL | Status: DC
  Administered 2019-04-14 – 2019-04-20 (×12): 25 mg via ORAL
  Filled 2019-04-14 (×13): qty 1

## 2019-04-14 MED ORDER — APIXABAN 2.5 MG PO TABS: 2.5000 mg | ORAL_TABLET | Freq: Two times a day (BID) | ORAL | Status: DC

## 2019-04-14 MED ORDER — METOLAZONE 2.5 MG PO TABS
2.5000 mg | ORAL_TABLET | Freq: Once | ORAL | Status: AC
  Administered 2019-04-14: 2.5 mg via ORAL
  Filled 2019-04-14: qty 1

## 2019-04-14 MED ORDER — FUROSEMIDE 10 MG/ML IJ SOLN
80.0000 mg | Freq: Two times a day (BID) | INTRAMUSCULAR | Status: AC
  Administered 2019-04-14: 80 mg via INTRAVENOUS
  Filled 2019-04-14 (×2): qty 8

## 2019-04-14 MED ORDER — ENOXAPARIN SODIUM 80 MG/0.8ML ~~LOC~~ SOLN
1.0000 mg/kg | SUBCUTANEOUS | Status: DC
  Administered 2019-04-14 – 2019-04-16 (×3): 75 mg via SUBCUTANEOUS
  Filled 2019-04-14 (×4): qty 0.8

## 2019-04-14 MED ORDER — DEXAMETHASONE SODIUM PHOSPHATE 10 MG/ML IJ SOLN
10.0000 mg | INTRAMUSCULAR | Status: DC
  Administered 2019-04-14 – 2019-04-20 (×7): 10 mg via INTRAVENOUS
  Filled 2019-04-14 (×7): qty 1

## 2019-04-14 MED ORDER — ONDANSETRON HCL 4 MG/2ML IJ SOLN
4.0000 mg | Freq: Four times a day (QID) | INTRAMUSCULAR | Status: DC | PRN
  Administered 2019-04-14 – 2019-04-20 (×2): 4 mg via INTRAVENOUS
  Filled 2019-04-14 (×2): qty 2

## 2019-04-14 NOTE — Progress Notes (Signed)
Pt now showing MEWS score RED. Pt on 15L NRB mask and 10L HFNC. Pt has been on this level of Oyxgen for most of day with no acute distress. Pt does desat very quickly when oxygen is removed for eating or drinking but does not appear in distress and does rebound with rest. Unable to ambulate or get OOB to chair today due to O2 needs. MD aware of changes in resp status this morning.

## 2019-04-14 NOTE — Progress Notes (Signed)
[facility] Progress Note   85 y/o woman HTN DM p/w weakness tested COVID+ 3 weeks ago, O2 sat 70% upon arrival. She does not want dialysis chronically. Felt bladder pressure but not able to place foley 1/8 with <100 ml on bladder scan. She rx Lasix and Metolazone on 1/8. U/S neg for hydro but small kidneys w/ incr echogen.  Assessment/ Plan:   1) AKI -Secondary to COVID-19 infection. Setting of RAAS blockade as well and may have had pre-renal insults with infection which progressed to ATN. Feels pressure in bladder - could have retention as well but difficulty trying to place foley. Will rely on qshift bladder scan -> qdaily. Don't see recordings; will request q24hrs for now.  - She doesn't  wantchronicdialysis - citing transportation - Conservative managementat this time - Renal function slowly improving still has e/o volume overload and will continue Lasix 80mg  BID +  low dose Metolazone  - Signing off at this time; please reconsult as needed.  2) COVID-19PNA -Therapies per primary team  3) Acute hypoxic respiratory failure -Supplemental oxygen per primary team -Secondary at least in part to COVID-19 -  lasix and metolazone  4)Paroxysmal atrial fibrillation -Anticoagulation is off given supratherapeutic INR  5) Anemia - normocytic  - mild and no indication for ESA  Subjective:   She feels that the breathing is modestly improved c/w yesterday, very weak. Denies f/c/n/v/ myalgias; non productive cough   Objective:   BP 128/81 (BP Location: Right Arm)   Pulse (!) 109   Temp 97.8 F (36.6 C) (Oral)   Resp (!) 25   Ht 5\' 5"  (1.651 m)   Wt 73.5 kg   SpO2 (!) 87%   BMI 26.96 kg/m   Intake/Output Summary (Last 24 hours) at 04/14/2019 0945 Last data filed at 04/14/2019 0400 Gross per 24 hour  Intake 600 ml  Output 1700 ml  Net -1100 ml   Weight change:   Physical Exam: General:elderly female in NAD at rest; frail, not able to speak  in full sentences today Heart:80-100's Lungs:crackles with decr BS on nasal cannula Abdomen:Soft and nontender.  Extremities:trace edema Neuro:alert and oriented x 3  Psych: normal mood and affect GU purewick   Imaging: No results found.  Labs: BMET Recent Labs  Lab 04/17/2019 1633 04/11/2019 1918 04/11/19 0724 04/12/19 0115 04/13/19 0404 04/14/19 0420  NA 132* 131* 132* 132* 135 137  K 4.9 4.7 4.6 4.8 4.8 4.6  CL 98  --  96* 97* 98 96*  CO2 24  --  22 20* 23 27  GLUCOSE 133*  --  143* 123* 124* 104*  BUN 46*  --  61* 75* 89* 97*  CREATININE 3.09*  --  3.64* 3.53* 3.05* 2.77*  CALCIUM 8.4*  --  8.1* 8.3* 8.4* 8.7*  PHOS  --   --  5.3*  --   --   --    CBC Recent Labs  Lab 04/11/19 0724 04/12/19 0115 04/13/19 0404 04/14/19 0420  WBC 13.3* 10.7* 11.5* 8.8  NEUTROABS 11.3* 9.0* 9.5* 6.9  HGB 11.1* 11.2* 12.8 13.6  HCT 33.7* 33.5* 37.5 40.9  MCV 91.3 90.3 89.1 90.9  PLT 419* 413* 301 295    Medications:    . apixaban  2.5 mg Oral BID  . vitamin C  500 mg Oral Daily  . cephALEXin  250 mg Oral Daily  . ezetimibe  10 mg Oral QPM  . flecainide  50 mg Oral BID  . folic acid  1 mg Oral Daily  . Ipratropium-Albuterol  1 puff Inhalation Q6H  . methylPREDNISolone (SOLU-MEDROL) injection  30 mg Intravenous Daily  . multivitamin with minerals  1 tablet Oral Daily  . pantoprazole  40 mg Oral BID  . QUEtiapine  25 mg Oral BID  . tamoxifen  20 mg Oral Daily  . thiamine  100 mg Oral Daily  . cyanocobalamin  100 mcg Oral Daily  . zinc sulfate  220 mg Oral Daily      Otelia Santee, MD 04/14/2019, 9:45 AM

## 2019-04-14 NOTE — Progress Notes (Addendum)
PROGRESS NOTE                                                                                                                                                                                                             Patient Demographics:    Emily Andrews, is a 85 y.o. female, DOB - [birth year], UDJ:[id number]  Outpatient Primary MD for the patient is Orpah Melter, MD    LOS - 4  Admit date - 04/08/2019    Chief Complaint  Patient presents with   Shortness of Breath    Covid+       Brief Narrative   85 year old Caucasian female with past medical history significant for proximal atrial fibrillation, [place] vas 2 score of at least 3 on Coumadin, hypertension, hyperlipidemia and diabetes mellitus.  Apparently, patient tested positive for COVID-19 virus infection about 3 weeks ago, she presented with shortness of breath was diagnosed with acute hypoxic respiratory failure due to COVID-19 pneumonia and admitted to hospital.   Subjective:   Patient in bed, appears comfortable, denies any headache, no fever, no chest pain or pressure, +ve shortness of breath , no abdominal pain. No focal weakness.    Assessment  & Plan :      1. Acute Hypoxic Resp. Failure due to Acute Covid 19 Viral Pneumonitis during the ongoing 2020 Covid 19 Pandemic - she has severe disease, she has been placed on IV steroids along with remdesivir however currently requiring 15 L of oxygen, CRP is elevated, will right away use Actemra.  She and her daughter have consented for the same.  Condition is extremely tenuous.  Post Actemra patient had improved on 04/13/2019 however on 04/13/2018 when she is more hypoxic, D-dimer had shot up on 04/12/2018 after her INR was reversed, I am questioning if she threw a PE.  Will challenge her with full dose Lovenox for a few days and monitor her clinical course, also continue IV Lasix for fluid overload and CHF.  Overall  condition is tenuous, explained in detail to patient's daughter and patient.  They both understand.  She is DNR.  Encouraged the patient to sit up in chair  in the daytime use I-S and flutter valve for pulmonary toiletry and then prone in bed when at night.   SpO2: 99 % O2 Flow Rate (L/min): 15 L/min   Recent Labs  Lab 04/09/2019 1633 04/13/2019 1700 04/07/2019 1904 04/11/19 0724 04/12/19 0115 04/13/19 0404 04/14/19 0420  CRP  --  21.0*  --  18.9* 13.2* 7.7* 4.4*  DDIMER 1.50*  --   --  1.20* 2.21* >20.00* 17.22*  FERRITIN  --  640*  --   --   --   --   --   BNP  --   --   --  771.0* 392.1* 356.5* 162.4*  PROCALCITON 1.10  --   --   --  0.53 0.39 0.34  SARSCOV2NAA  --   --  POSITIVE*  --   --   --   --     Hepatic Function Latest Ref Rng & Units 04/14/2019 04/13/2019 04/12/2019  Total Protein 6.5 - 8.1 g/dL 5.9(L) 5.9(L) 5.6(L)  Albumin 3.5 - 5.0 g/dL 2.2(L) 2.0(L) 1.8(L)  AST 15 - 41 U/L 44(H) 50(H) 96(H)  ALT 0 - 44 U/L 39 48(H) 57(H)  Alk Phosphatase 38 - 126 U/L 111 101 91  Total Bilirubin 0.3 - 1.2 mg/dL 1.1 0.7 0.7  Bilirubin, Direct 0.0 - 0.3 mg/dL - - -     2.  Acute on chronic diastolic CHF EF 28% on echocardiogram done in 2019- continue IV Lasix and Zaroxolyn.  Continue to monitor.  3.  AKI -could be ATN due to recent infection, was also on ARB.  Renal ultrasound nonacute but does show chronic kidney disease.  Last creatinine was under 1.2 in 2019, clinically she looks to be in fluid overload and she is responding well to ongoing diuresis with IV Lasix and Zaroxolyn which will be continued, renal following with a distance.  4.  Paroxysmal atrial fibrillation.  [place] vas 2 score was greater than 3, she was on Coumadin with erratic INR control, when she was admitted her INR was greater than 10, Coumadin has been reversed with vitamin K IV, INR stable will be transitioned to Eliquis now.   5.? UTI.  Oral Keflex for a total of 3 days.  6.  Spike in D-dimer.  This happened after  her vitamin K was reversed, question if she has thrown a clot, although she is on Eliquis will challenge her with a few days of Lovenox starting on 04/14/2019, she has also become quite short of breath after improvement on 04/14/2019.  Will monitor closely.    Condition - Fair  Family Communication  :  Daughter 1/7, 1/9, 1/10  Code Status :  DNR  Diet :   Diet Order            Diet heart healthy/carb modified Room service appropriate? Yes; Fluid consistency: Thin  Diet effective now               Disposition Plan  :  TBD  Consults  :  None  Procedures  :    Renal US - non acute  PUD Prophylaxis : PPI  DVT Prophylaxis  : Coumadin/Eliquis/Lovenox  Lab Results  Component Value Date   INR 2.0 (H) 04/12/2019   INR >10.0 (HH) 04/11/2019   INR >10.0 (HH) 05/03/2019     Lab Results  Component Value Date   PLT 295 04/14/2019    Inpatient Medications  Scheduled Meds:  apixaban  2.5 mg Oral BID   vitamin C  500 mg Oral Daily   cephALEXin  250 mg Oral Daily   dexamethasone (DECADRON) injection  10 mg Intravenous Q24H   ezetimibe  10 mg Oral QPM   flecainide  50 mg Oral BID   folic acid  1 mg Oral Daily   furosemide  80 mg Intravenous BID   Ipratropium-Albuterol  1 puff Inhalation Q6H   metolazone  2.5 mg Oral Once   multivitamin with minerals  1 tablet Oral Daily   pantoprazole  40 mg Oral BID   QUEtiapine  25 mg Oral BID   tamoxifen  20 mg Oral Daily   thiamine  100 mg Oral Daily   cyanocobalamin  100 mcg Oral Daily   zinc sulfate  220 mg Oral Daily   Continuous Infusions:  remdesivir 100 mg in NS 100 mL 100 mg (04/14/19 0957)   PRN Meds:.fluticasone, ondansetron (ZOFRAN) IV  Antibiotics  :    Anti-infectives (From admission, onward)   Start     Dose/Rate Route Frequency Ordered Stop   04/12/19 1000  remdesivir 100 mg in sodium chloride 0.9 % 100 mL IVPB  Status:  Discontinued     100 mg 200 mL/hr over 30 Minutes Intravenous Daily  04/11/19 0433 04/11/19 0446   04/12/19 1000  remdesivir 100 mg in sodium chloride 0.9 % 100 mL IVPB     100 mg 200 mL/hr over 30 Minutes Intravenous Daily 04/11/19 0447 04/16/19 0959   04/12/19 0800  cephALEXin (KEFLEX) capsule 250 mg     250 mg Oral Daily 04/12/19 0753 04/16/19 1019   04/12/19 0745  cephALEXin (KEFLEX) capsule 500 mg  Status:  Discontinued     500 mg Oral Every 12 hours 04/12/19 0736 04/12/19 0751   04/11/19 1000  remdesivir 100 mg in sodium chloride 0.9 % 100 mL IVPB  Status:  Discontinued     100 mg 200 mL/hr over 30 Minutes Intravenous Daily 04/26/2019 2234 04/11/19 0447   04/11/19 0500  azithromycin (ZITHROMAX) 500 mg in sodium chloride 0.9 % 250 mL IVPB  Status:  Discontinued     500 mg 250 mL/hr over 60 Minutes Intravenous Daily 04/11/19 0433 04/11/19 0734   04/11/19 0500  cefTRIAXone (ROCEPHIN) 1 g in sodium chloride 0.9 % 100 mL IVPB  Status:  Discontinued     1 g 200 mL/hr over 30 Minutes Intravenous Daily 04/11/19 0433 04/11/19 0734   04/11/19 0445  remdesivir 200 mg in sodium chloride 0.9% 250 mL IVPB  Status:  Discontinued     200 mg 580 mL/hr over 30 Minutes Intravenous Once 04/11/19 0433 04/11/19 0551   04/14/2019 2245  remdesivir 200 mg in sodium chloride 0.9% 250 mL IVPB     200 mg 580 mL/hr over 30 Minutes Intravenous Once 04/07/2019 2234 04/11/19 0618       Time Spent in minutes  30   Lala Lund M.D on 04/14/2019 at 12:12 PM  To page go to [email or web address] - password [facility]  [facility] -  Office  [phone]    See all Orders from today for further details    Objective:   Vitals:   04/14/19 0800 04/14/19 0900 04/14/19 0930 04/14/19 1111  BP: 128/81   127/68  Pulse: (!) 103 (!) 114 (!) 109 (!) 109  Resp:  (!) 38 (!) 25 20  Temp: 97.8 F (36.6 C)   98 F (36.7 C)  TempSrc: Oral   Oral  SpO2: (!) 87% (!) 72% (!) 87% 99%  Weight:  Height:        Wt Readings from Last 3 Encounters:  04/14/2019 73.5 kg  03/29/19 73.5 kg    02/04/19 75.1 kg     Intake/Output Summary (Last 24 hours) at 04/14/2019 1212 Last data filed at 04/14/2019 0957 Gross per 24 hour  Intake 700 ml  Output 1700 ml  Net -1000 ml     Physical Exam  Awake Alert,  No new F.N deficits, Normal affect Marie.AT,PERRAL Supple Neck,No JVD, No cervical lymphadenopathy appriciated.  Symmetrical Chest wall movement, Good air movement bilaterally, ++ rales RRR,No Gallops, Rubs or new Murmurs, No Parasternal Heave +ve B.Sounds, Abd Soft, No tenderness, No organomegaly appriciated, No rebound - guarding or rigidity. No Cyanosis, Clubbing or edema, No new Rash or bruise    Data Review:    CBC Recent Labs  Lab 04/16/2019 1633 04/08/2019 1918 04/11/19 0724 04/12/19 0115 04/13/19 0404 04/14/19 0420  WBC 18.0*  --  13.3* 10.7* 11.5* 8.8  HGB 12.2 12.2 11.1* 11.2* 12.8 13.6  HCT 36.1 36.0 33.7* 33.5* 37.5 40.9  PLT 458*  --  419* 413* 301 295  MCV 92.3  --  91.3 90.3 89.1 90.9  MCH 31.2  --  30.1 30.2 30.4 30.2  MCHC 33.8  --  32.9 33.4 34.1 33.3  RDW 14.0  --  13.9 13.9 14.0 14.6  LYMPHSABS 1.5  --  1.2 1.1 1.1 1.4  MONOABS 1.0  --  0.6 0.5 0.8 0.4  EOSABS 0.0  --  0.0 0.0 0.0 0.1  BASOSABS 0.0  --  0.0 0.0 0.0 0.0    Chemistries  Recent Labs  Lab 04/25/2019 1633 04/19/2019 1918 04/11/19 0724 04/12/19 0115 04/13/19 0404 04/14/19 0420  NA 132* 131* 132* 132* 135 137  K 4.9 4.7 4.6 4.8 4.8 4.6  CL 98  --  96* 97* 98 96*  CO2 24  --  22 20* 23 27  GLUCOSE 133*  --  143* 123* 124* 104*  BUN 46*  --  61* 75* 89* 97*  CREATININE 3.09*  --  3.64* 3.53* 3.05* 2.77*  CALCIUM 8.4*  --  8.1* 8.3* 8.4* 8.7*  MG 1.8  --  1.9 2.1 2.1 2.0  AST 51*  --  46* 96* 50* 44*  ALT 30  --  28 57* 48* 39  ALKPHOS 112  --  85 91 101 111  BILITOT 1.3*  --  1.1 0.7 0.7 1.1   ------------------------------------------------------------------------------------------------------------------ No results for input(s): CHOL, HDL, LDLCALC, TRIG, CHOLHDL,  LDLDIRECT in the last 72 hours.  Lab Results  Component Value Date   HGBA1C 5.5 03/23/2018   ------------------------------------------------------------------------------------------------------------------ No results for input(s): TSH, T4TOTAL, T3FREE, THYROIDAB in the last 72 hours.  Invalid input(s): FREET3  Cardiac Enzymes No results for input(s): CKMB, TROPONINI, MYOGLOBIN in the last 168 hours.  Invalid input(s): CK ------------------------------------------------------------------------------------------------------------------    Component Value Date/Time   BNP 162.4 (H) 04/14/2019 0420    Micro Results Recent Results (from the past 240 hour(s))  Blood Culture (routine x 2)     Status: None (Preliminary result)   Collection Time: 04/27/2019  5:13 PM   Specimen: BLOOD RIGHT WRIST  Result Value Ref Range Status   Specimen Description BLOOD RIGHT WRIST  Final   Special Requests   Final    BOTTLES DRAWN AEROBIC AND ANAEROBIC Blood Culture results may not be optimal due to an inadequate volume of blood received in culture bottles   Culture   Final  NO GROWTH 4 DAYS Performed at [facility], [place] [address] [place], [place] [address]    Report Status PENDING  Incomplete  Blood Culture (routine x 2)     Status: None (Preliminary result)   Collection Time: 04/16/2019  5:13 PM   Specimen: BLOOD RIGHT ARM  Result Value Ref Range Status   Specimen Description BLOOD RIGHT ARM  Final   Special Requests AEROBIC BOTTLE ONLY Blood Culture adequate volume  Final   Culture   Final    NO GROWTH 4 DAYS Performed at [facility], [place] [address], [place], [place] [address]    Report Status PENDING  Incomplete  SARS CORONAVIRUS 2 (TAT 6-24 HRS) Nasopharyngeal Nasopharyngeal Swab     Status: Abnormal   Collection Time: 04/25/2019  7:04 PM   Specimen: Nasopharyngeal Swab  Result Value Ref Range Status   SARS Coronavirus 2 POSITIVE (A) NEGATIVE Final    Comment: RESULT  CALLED TO, READ BACK BY AND VERIFIED WITH: B.BECK,RN [id number] 04/11/19 G.MCADOO (NOTE) SARS-CoV-2 target nucleic acids are DETECTED. The SARS-CoV-2 RNA is generally detectable in upper and lower respiratory specimens during the acute phase of infection. Positive results are indicative of the presence of SARS-CoV-2 RNA. Clinical correlation with patient history and other diagnostic information is  necessary to determine patient infection status. Positive results do not rule out bacterial infection or co-infection with other viruses.  The expected result is Negative. Fact Sheet for Patients: SugarRoll.be Fact Sheet for Healthcare Providers: https://www.woods-mathews.com/ This test is not yet approved or cleared by the [place] FDA and  has been authorized for detection and/or diagnosis of SARS-CoV-2 by FDA under an Emergency Use Authorization (EUA). This EUA will remain  in effect (meaning this test can be used) for the dura tion of the COVID-19 declaration under Section 564(b)(1) of the Act, 21 U.S.C. section 360bbb-3(b)(1), unless the authorization is terminated or revoked sooner. Performed at [facility], [place] [address], [place], [place] [address]     Radiology Reports CT Head Wo Contrast  Result Date: 03/29/2019 CLINICAL DATA:  Poly trauma, fell this evening striking head on floor; past history of breast cancer, hypertension, diabetes mellitus, paroxysmal atrial fibrillation EXAM: CT HEAD WITHOUT CONTRAST CT CERVICAL SPINE WITHOUT CONTRAST TECHNIQUE: Multidetector CT imaging of the head and cervical spine was performed following the standard protocol without intravenous contrast. Multiplanar CT image reconstructions of the cervical spine were also generated. COMPARISON:  CT head 03/09/2018, MRI cervical spine 09/10/2007 FINDINGS: CT HEAD FINDINGS Brain: Generalized atrophy. Normal ventricular morphology. No midline shift or mass effect.  Mild small vessel chronic ischemic changes of deep cerebral white matter. Streak artifacts at temporal lobes due to motion. Low-attenuation at LEFT occipital lobe, by sagittal images likely related to same artifacts. No intracranial hemorrhage, mass lesion or definite acute infarction. No extra-axial fluid collections. Vascular: Atherosclerotic calcifications of internal carotid arteries at skull base. Skull: Calvaria intact.  Scattered motion artifacts noted. Sinuses/Orbits: Clear Other: N/A CT CERVICAL SPINE FINDINGS Alignment: Anterolisthesis at C3-C4, unchanged. Remaining alignments normal. Skull base and vertebrae: Osseous demineralization. Skull base intact. Multilevel facet degenerative changes. Multilevel degenerative disc disease changes. Vertebral body heights maintained without fracture or bone destruction. Ankylosis of C4-C5 facet joints. Encroachment upon C3-C4 and C5-C6 neural foramina bilaterally as well as RIGHT C6-C7. Soft tissues and spinal canal: Prevertebral soft tissues normal thickness. Atherosclerotic calcifications within the carotid systems bilaterally. BILATERAL carotid bifurcations extend retropharyngeal. Additional atherosclerotic calcifications at aortic arch. Disc levels:  No additional abnormalities Upper chest: Scarring at RIGHT apex. Other: N/A IMPRESSION: Atrophy with small vessel chronic ischemic changes of deep cerebral white matter. No acute intracranial abnormalities identified on exam limited by mild patient motion artifacts. Degenerative disc and facet disease changes of the cervical spine. No acute cervical spine abnormalities. Electronically Signed   By: Lavonia Dana M.D.   On: 03/29/2019 21:05   CT Cervical Spine Wo Contrast  Result Date: 03/29/2019 CLINICAL DATA:  Poly trauma, fell this evening striking head on floor; past history of breast cancer, hypertension, diabetes mellitus, paroxysmal atrial fibrillation EXAM: CT HEAD WITHOUT CONTRAST CT CERVICAL SPINE WITHOUT  CONTRAST TECHNIQUE: Multidetector CT imaging of the head and cervical spine was performed following the standard protocol without intravenous contrast. Multiplanar CT image reconstructions of the cervical spine were also generated. COMPARISON:  CT head 03/09/2018, MRI cervical spine 09/10/2007 FINDINGS: CT HEAD FINDINGS Brain: Generalized atrophy. Normal ventricular morphology. No midline shift or mass effect. Mild small vessel chronic ischemic changes of deep cerebral white matter. Streak artifacts at temporal lobes due to motion. Low-attenuation at LEFT occipital lobe, by sagittal images likely related to same artifacts. No intracranial hemorrhage, mass lesion or definite acute infarction. No extra-axial fluid collections. Vascular: Atherosclerotic calcifications of internal carotid arteries at skull base. Skull: Calvaria intact.  Scattered motion artifacts noted. Sinuses/Orbits: Clear Other: N/A CT CERVICAL SPINE FINDINGS Alignment: Anterolisthesis at C3-C4, unchanged. Remaining alignments normal. Skull base and vertebrae: Osseous demineralization. Skull base intact. Multilevel facet degenerative changes. Multilevel degenerative disc disease changes. Vertebral body heights maintained without fracture or bone destruction. Ankylosis of C4-C5 facet joints. Encroachment upon C3-C4 and C5-C6 neural foramina bilaterally as well as RIGHT C6-C7. Soft tissues and spinal canal: Prevertebral soft tissues normal thickness. Atherosclerotic calcifications within the carotid systems bilaterally. BILATERAL carotid bifurcations extend retropharyngeal. Additional atherosclerotic calcifications at aortic arch. Disc levels:  No additional abnormalities Upper chest: Scarring at RIGHT apex. Other: N/A IMPRESSION: Atrophy with small vessel chronic ischemic changes of deep cerebral white matter. No acute intracranial abnormalities identified on exam limited by mild patient motion artifacts. Degenerative disc and facet disease changes of  the cervical spine. No acute cervical spine abnormalities. Electronically Signed   By: Lavonia Dana M.D.   On: 03/29/2019 21:05   US Renal  Result Date: 04/28/2019 CLINICAL DATA:  Acute kidney injury EXAM: RENAL / URINARY TRACT ULTRASOUND COMPLETE COMPARISON:  None. FINDINGS: Right Kidney: Renal measurements: 7.9 x 4.2 x 3.9 cm = volume: 65.8 mL. There is increased cortical echogenicity. There is no hydronephrosis. Left Kidney: Renal measurements: 8.7 x 4.7 x 3.6 cm = volume: 77 mL. There is increased cortical echogenicity. There is no hydronephrosis. Bladder: Appears normal for degree of bladder distention. Other: None. IMPRESSION: 1. No acute abnormality. 2. Echogenic kidneys bilaterally which can be seen in patients with medical renal disease. Electronically Signed   By: Constance Holster M.D.   On: 04/23/2019 23:41   DG Chest Port 1 View  Result Date: 04/14/2019 CLINICAL DATA:  COVID-19 positive, dyspnea EXAM: PORTABLE CHEST 1 VIEW COMPARISON:  05/03/2019 chest radiograph. FINDINGS: Stable cardiomediastinal silhouette with normal heart size. No pneumothorax. No pleural effusion. Extensive patchy opacities throughout both lungs appear worsened. IMPRESSION: Worsening extensive patchy opacities throughout both lungs, compatible with COVID-19 pneumonia. Electronically Signed   By: Ilona Sorrel M.D.   On: 04/14/2019 11:44   DG Chest Portable 1 View  Result Date: 04/28/2019 CLINICAL DATA:  Hypoxia. EXAM: PORTABLE CHEST 1 VIEW COMPARISON:  01/01/2019 FINDINGS:  There are diffuse bilateral hazy ground-glass airspace opacities, greatest within the left lung zone. The heart size is borderline enlarged. Aortic calcifications are noted. There is no pneumothorax or large pleural effusion. There is no acute osseous abnormality. IMPRESSION: Diffuse bilateral ground-glass airspace opacities consistent with the patient's history of viral pneumonia. Electronically Signed   By: Constance Holster M.D.   On: 05/04/2019  16:26

## 2019-04-14 NOTE — Progress Notes (Signed)
Patient has been restless this evening.  Calling out often and desats more when staff is in room.  Changed patient to a NRB from the HFNC.  Patient sats go to low 70s on 15L NRB while staff is in room.  Charge nurse recommended requesting something for anxiety before adding the HFNC with the NRB. Dr. Vanita Ingles paged and awaiting orders, if given.

## 2019-04-15 ENCOUNTER — Inpatient Hospital Stay (HOSPITAL_COMMUNITY): Payer: Medicare Other

## 2019-04-15 LAB — C-REACTIVE PROTEIN: CRP: 2.6 mg/dL — ABNORMAL HIGH (ref <1.0)

## 2019-04-15 LAB — COMPREHENSIVE METABOLIC PANEL
ALT: 36 U/L (ref 0–44)
AST: 46 U/L — ABNORMAL HIGH (ref 15–41)
Albumin: 2.4 g/dL — ABNORMAL LOW (ref 3.5–5.0)
Alkaline Phosphatase: 89 U/L (ref 38–126)
Anion gap: 14 (ref 5–15)
BUN: 104 mg/dL — ABNORMAL HIGH (ref 8–23)
CO2: 29 mmol/L (ref 22–32)
Calcium: 8.8 mg/dL — ABNORMAL LOW (ref 8.9–10.3)
Chloride: 99 mmol/L (ref 98–111)
Creatinine, Ser: 2.54 mg/dL — ABNORMAL HIGH (ref 0.44–1.00)
GFR calc Af Amer: 20 mL/min — ABNORMAL LOW (ref >60)
GFR calc non Af Amer: 17 mL/min — ABNORMAL LOW (ref >60)
Glucose, Bld: 153 mg/dL — ABNORMAL HIGH (ref 70–99)
Potassium: 4.4 mmol/L (ref 3.5–5.1)
Sodium: 142 mmol/L (ref 135–145)
Total Bilirubin: 0.9 mg/dL (ref 0.3–1.2)
Total Protein: 5.9 g/dL — ABNORMAL LOW (ref 6.5–8.1)

## 2019-04-15 LAB — PROTEIN ELECTROPHORESIS, SERUM
A/G Ratio: 0.6 — ABNORMAL LOW (ref 0.7–1.7)
Albumin ELP: 2.1 g/dL — ABNORMAL LOW (ref 2.9–4.4)
Alpha-1-Globulin: 0.6 g/dL — ABNORMAL HIGH (ref 0.0–0.4)
Alpha-2-Globulin: 1 g/dL (ref 0.4–1.0)
Beta Globulin: 1 g/dL (ref 0.7–1.3)
Gamma Globulin: 0.9 g/dL (ref 0.4–1.8)
Globulin, Total: 3.6 g/dL (ref 2.2–3.9)
M-Spike, %: 0.3 g/dL — ABNORMAL HIGH
Total Protein ELP: 5.7 g/dL — ABNORMAL LOW (ref 6.0–8.5)

## 2019-04-15 LAB — CBC WITH DIFFERENTIAL/PLATELET
Abs Immature Granulocytes: 0.07 10*3/uL (ref 0.00–0.07)
Basophils Absolute: 0 10*3/uL (ref 0.0–0.1)
Basophils Relative: 0 %
Eosinophils Absolute: 0 10*3/uL (ref 0.0–0.5)
Eosinophils Relative: 0 %
HCT: 40.8 % (ref 36.0–46.0)
Hemoglobin: 13.4 g/dL (ref 12.0–15.0)
Immature Granulocytes: 1 %
Lymphocytes Relative: 12 %
Lymphs Abs: 1.2 10*3/uL (ref 0.7–4.0)
MCH: 30.5 pg (ref 26.0–34.0)
MCHC: 32.8 g/dL (ref 30.0–36.0)
MCV: 92.7 fL (ref 80.0–100.0)
Monocytes Absolute: 0.4 10*3/uL (ref 0.1–1.0)
Monocytes Relative: 5 %
Neutro Abs: 8.1 10*3/uL — ABNORMAL HIGH (ref 1.7–7.7)
Neutrophils Relative %: 82 %
Platelets: 279 10*3/uL (ref 150–400)
RBC: 4.4 MIL/uL (ref 3.87–5.11)
RDW: 14.9 % (ref 11.5–15.5)
WBC: 9.8 10*3/uL (ref 4.0–10.5)
nRBC: 0 % (ref 0.0–0.2)

## 2019-04-15 LAB — CULTURE, BLOOD (ROUTINE X 2)
Culture: NO GROWTH
Culture: NO GROWTH
Special Requests: ADEQUATE

## 2019-04-15 LAB — MAGNESIUM: Magnesium: 2.2 mg/dL (ref 1.7–2.4)

## 2019-04-15 LAB — PROCALCITONIN: Procalcitonin: 0.28 ng/mL

## 2019-04-15 LAB — GLUCOSE, CAPILLARY: Glucose-Capillary: 182 mg/dL — ABNORMAL HIGH (ref 70–99)

## 2019-04-15 LAB — BRAIN NATRIURETIC PEPTIDE: B Natriuretic Peptide: 232.6 pg/mL — ABNORMAL HIGH (ref 0.0–100.0)

## 2019-04-15 LAB — D-DIMER, QUANTITATIVE: D-Dimer, Quant: 13.53 ug/mL-FEU — ABNORMAL HIGH (ref 0.00–0.50)

## 2019-04-15 MED ORDER — SPIRONOLACTONE 25 MG PO TABS
25.0000 mg | ORAL_TABLET | Freq: Once | ORAL | Status: AC
  Administered 2019-04-15: 25 mg via ORAL
  Filled 2019-04-15 (×2): qty 1

## 2019-04-15 MED ORDER — FUROSEMIDE 10 MG/ML IJ SOLN
80.0000 mg | Freq: Two times a day (BID) | INTRAMUSCULAR | Status: DC
  Administered 2019-04-15 – 2019-04-19 (×8): 80 mg via INTRAVENOUS
  Filled 2019-04-15 (×8): qty 8

## 2019-04-15 MED ORDER — METOLAZONE 2.5 MG PO TABS
2.5000 mg | ORAL_TABLET | Freq: Once | ORAL | Status: AC
  Administered 2019-04-15: 2.5 mg via ORAL
  Filled 2019-04-15: qty 1

## 2019-04-15 MED ORDER — SALINE SPRAY 0.65 % NA SOLN
1.0000 | NASAL | Status: DC | PRN
  Administered 2019-04-16 – 2019-04-18 (×4): 1 via NASAL
  Filled 2019-04-15: qty 44

## 2019-04-15 MED ORDER — FUROSEMIDE 10 MG/ML IJ SOLN: 80.0000 mg | Freq: Two times a day (BID) | INTRAMUSCULAR | Status: DC

## 2019-04-15 MED ORDER — TOCILIZUMAB 400 MG/20ML IV SOLN
600.0000 mg | Freq: Once | INTRAVENOUS | Status: AC
  Administered 2019-04-15: 600 mg via INTRAVENOUS
  Filled 2019-04-15: qty 20

## 2019-04-15 NOTE — Care Plan (Signed)
Paged for worsening hypoxia patient on 15L [place] O2/100% nonrebreather sats in the 70s.  She received 80 mg of Lasix around 1800 hrs, reported >101ml, patient transferred for [place].  Currently once patient was switched to [facility] she became unresponsive and sats dropped to <20, rapid response called.  Patient has since improved to sats in the 80s.  CXR reviewed at bedside consistent with SARS-CoV-2 pneumonia.  Will administer an additional dose of Lasix.

## 2019-04-15 NOTE — Progress Notes (Signed)
PT placed on HHFNC 45L 100% and NRB.  Sp02 84/88%.

## 2019-04-15 NOTE — Progress Notes (Signed)
PROGRESS NOTE                                                                                                                                                                                                             Patient Demographics:    Emily Andrews, is a 85 y.o. female, DOB - 11/11/1934, XMD:[id number]  Outpatient Primary MD for the patient is Orpah Melter, MD    LOS - 5  Admit date - 05/01/2019    Chief Complaint  Patient presents with  . Shortness of Breath    Covid+       Brief Narrative   85 year old Caucasian female with past medical history significant for proximal atrial fibrillation, [place] vas 2 score of at least 3 on Coumadin, hypertension, hyperlipidemia and diabetes mellitus.  Apparently, patient tested positive for COVID-19 virus infection about 3 weeks ago, she presented with shortness of breath was diagnosed with acute hypoxic respiratory failure due to COVID-19 pneumonia and admitted to hospital.   Subjective:   Patient in bed, appears comfortable, denies any headache, no fever, no chest pain or pressure, ++ shortness of breath , no abdominal pain. No focal weakness.   Assessment  & Plan :     1. Acute Hypoxic Resp. Failure due to Acute Covid 19 Viral Pneumonitis during the ongoing 2020 Covid 19 Pandemic - she has severe disease, she has been placed on IV steroids, remdesivir and received Actemra dose upon arrival to [facility], she also had a lot of fluid overload and CHF and was diuresed with IV Lasix.  She improved for a day or 2 however subsequently started to get more and more hypoxic, Actemra dose will be repeated on 04/15/2019.  Also she is currently on Lovenox as her D-dimer was creeping up.    She is extremely tenuous.  Had detailed discussion with patient's daughter, she remains DNR.  If any further decline full comfort measures.   Encouraged the patient to sit up in chair in the daytime use I-S  and flutter valve for pulmonary toiletry and then prone in bed when at night.   SpO2: (!) 86 % O2 Flow Rate (L/min): 45 L/min FiO2 (%): 100 %   Recent Labs  Lab 04/08/2019 1633 04/11/2019 1700 05/01/2019 1904 04/11/19 0724 04/12/19 0115 04/13/19 0404 04/14/19 0420 04/15/19 0547  CRP  --  21.0*  --  18.9* 13.2* 7.7* 4.4* 2.6*  DDIMER 1.50*  --   --  1.20* 2.21* >20.00* 17.22* 13.53*  FERRITIN  --  640*  --   --   --   --   --   --   BNP  --   --   --  771.0* 392.1* 356.5* 162.4* 232.6*  PROCALCITON 1.10  --   --   --  0.53 0.39 0.34 0.28  SARSCOV2NAA  --   --  POSITIVE*  --   --   --   --   --     Hepatic Function Latest Ref Rng & Units 04/15/2019 04/14/2019 04/13/2019  Total Protein 6.5 - 8.1 g/dL 5.9(L) 5.9(L) 5.9(L)  Albumin 3.5 - 5.0 g/dL 2.4(L) 2.2(L) 2.0(L)  AST 15 - 41 U/L 46(H) 44(H) 50(H)  ALT 0 - 44 U/L 36 39 48(H)  Alk Phosphatase 38 - 126 U/L 89 111 101  Total Bilirubin 0.3 - 1.2 mg/dL 0.9 1.1 0.7  Bilirubin, Direct 0.0 - 0.3 mg/dL - - -     2.  Acute on chronic diastolic CHF EF 53% on echocardiogram done in 2019- continue IV Lasix and Zaroxolyn.  Continue to monitor.  3.  AKI -could be ATN due to recent infection, was also on ARB.  Renal ultrasound nonacute but does show chronic kidney disease.  Last creatinine was under 1.2 in 2019, clinically she looks to be in fluid overload and she is responding well to ongoing diuresis with IV Lasix and Zaroxolyn which will be continued, renal following with a distance.  4.  Paroxysmal atrial fibrillation.  [place] vas 2 score was greater than 3, she was on Coumadin with erratic INR control, when she was admitted her INR was greater than 10, Coumadin has been reversed with vitamin K IV, INR stable will be transitioned to Lovenox  now.   5.? UTI.  Oral Keflex for a total of 3 days.  6.  Spike in D-dimer.  This happened after her vitamin K was reversed, question if she has thrown a clot, although she is on Eliquis will challenge her with  a few days of Lovenox starting on 04/14/2019, she has also become quite short of breath after improvement on 04/14/2019.  Will monitor closely.    Condition - Fair  Family Communication  :  Daughter 1/7, 1/9, 1/10, 1/11  Code Status :  DNR  Diet :   Diet Order            Diet heart healthy/carb modified Room service appropriate? Yes; Fluid consistency: Thin  Diet effective now               Disposition Plan  :  TBD  Consults  :  None  Procedures  :    Renal US - non acute  PUD Prophylaxis : PPI  DVT Prophylaxis  : Coumadin/Eliquis/Lovenox  Lab Results  Component Value Date   INR 2.0 (H) 04/12/2019   INR >10.0 (HH) 04/11/2019   INR >10.0 (HH) 04/08/2019     Lab Results  Component Value Date   PLT 279 04/15/2019    Inpatient Medications  Scheduled Meds: . [START ON 04/18/2019] apixaban  2.5 mg Oral BID  . vitamin C  500 mg Oral Daily  . cephALEXin  250 mg Oral Daily  . dexamethasone (DECADRON) injection  10 mg Intravenous Q24H  . enoxaparin (LOVENOX) injection  1 mg/kg Subcutaneous Q24H  . ezetimibe  10 mg Oral QPM  .  flecainide  50 mg Oral BID  . folic acid  1 mg Oral Daily  . furosemide  80 mg Intravenous BID  . furosemide  80 mg Intravenous BID  . Ipratropium-Albuterol  1 puff Inhalation Q6H  . multivitamin with minerals  1 tablet Oral Daily  . pantoprazole  40 mg Oral BID  . QUEtiapine  25 mg Oral BID  . spironolactone  25 mg Oral Once  . tamoxifen  20 mg Oral Daily  . thiamine  100 mg Oral Daily  . cyanocobalamin  100 mcg Oral Daily  . zinc sulfate  220 mg Oral Daily   Continuous Infusions: . tocilizumab (ACTEMRA) - non-COVID treatment 600 mg (04/15/19 1107)   PRN Meds:.fluticasone, ondansetron (ZOFRAN) IV  Antibiotics  :    Anti-infectives (From admission, onward)   Start     Dose/Rate Route Frequency Ordered Stop   04/12/19 1000  remdesivir 100 mg in sodium chloride 0.9 % 100 mL IVPB  Status:  Discontinued     100 mg 200 mL/hr over 30  Minutes Intravenous Daily 04/11/19 0433 04/11/19 0446   04/12/19 1000  remdesivir 100 mg in sodium chloride 0.9 % 100 mL IVPB     100 mg 200 mL/hr over 30 Minutes Intravenous Daily 04/11/19 0447 04/15/19 0928   04/12/19 0800  cephALEXin (KEFLEX) capsule 250 mg     250 mg Oral Daily 04/12/19 0753 04/16/19 1019   04/12/19 0745  cephALEXin (KEFLEX) capsule 500 mg  Status:  Discontinued     500 mg Oral Every 12 hours 04/12/19 0736 04/12/19 0751   04/11/19 1000  remdesivir 100 mg in sodium chloride 0.9 % 100 mL IVPB  Status:  Discontinued     100 mg 200 mL/hr over 30 Minutes Intravenous Daily 04/24/2019 2234 04/11/19 0447   04/11/19 0500  azithromycin (ZITHROMAX) 500 mg in sodium chloride 0.9 % 250 mL IVPB  Status:  Discontinued     500 mg 250 mL/hr over 60 Minutes Intravenous Daily 04/11/19 0433 04/11/19 0734   04/11/19 0500  cefTRIAXone (ROCEPHIN) 1 g in sodium chloride 0.9 % 100 mL IVPB  Status:  Discontinued     1 g 200 mL/hr over 30 Minutes Intravenous Daily 04/11/19 0433 04/11/19 0734   04/11/19 0445  remdesivir 200 mg in sodium chloride 0.9% 250 mL IVPB  Status:  Discontinued     200 mg 580 mL/hr over 30 Minutes Intravenous Once 04/11/19 0433 04/11/19 0551   04/13/2019 2245  remdesivir 200 mg in sodium chloride 0.9% 250 mL IVPB     200 mg 580 mL/hr over 30 Minutes Intravenous Once 04/14/2019 2234 04/11/19 0618       Time Spent in minutes  30   Lala Lund M.D on 04/15/2019 at 11:38 AM  To page go to [email or web address] - password [facility]  [facility] -  Office  [phone]    See all Orders from today for further details    Objective:   Vitals:   04/15/19 0900 04/15/19 0920 04/15/19 1000 04/15/19 1100  BP: 114/60  (!) 108/54 (!) 107/59  Pulse: 80 84 85 83  Resp: (!) 31 (!) 32 (!) 26 (!) 23  Temp:      TempSrc:      SpO2: (!) 85% (!) 85% (!) 84% (!) 86%  Weight:      Height:        Wt Readings from Last 3 Encounters:  05/03/2019 73.5 kg  03/29/19 73.5 kg  02/04/19  75.1 kg  Intake/Output Summary (Last 24 hours) at 04/15/2019 1138 Last data filed at 04/15/2019 0500 Gross per 24 hour  Intake 240 ml  Output 1500 ml  Net -1260 ml     Physical Exam  Awake Alert , No new F.N deficits, Normal affect [place].AT,PERRAL Supple Neck,No JVD, No cervical lymphadenopathy appriciated.  Symmetrical Chest wall movement, Good air movement bilaterally, ++ rales RRR,No Gallops, Rubs or new Murmurs, No Parasternal Heave +ve B.Sounds, Abd Soft, No tenderness, No organomegaly appriciated, No rebound - guarding or rigidity. No Cyanosis, Clubbing or edema, No new Rash or bruise     Data Review:    CBC Recent Labs  Lab 04/11/19 0724 04/12/19 0115 04/13/19 0404 04/14/19 0420 04/15/19 0547  WBC 13.3* 10.7* 11.5* 8.8 9.8  HGB 11.1* 11.2* 12.8 13.6 13.4  HCT 33.7* 33.5* 37.5 40.9 40.8  PLT 419* 413* 301 295 279  MCV 91.3 90.3 89.1 90.9 92.7  MCH 30.1 30.2 30.4 30.2 30.5  MCHC 32.9 33.4 34.1 33.3 32.8  RDW 13.9 13.9 14.0 14.6 14.9  LYMPHSABS 1.2 1.1 1.1 1.4 1.2  MONOABS 0.6 0.5 0.8 0.4 0.4  EOSABS 0.0 0.0 0.0 0.1 0.0  BASOSABS 0.0 0.0 0.0 0.0 0.0    Chemistries  Recent Labs  Lab 04/11/19 0724 04/12/19 0115 04/13/19 0404 04/14/19 0420 04/15/19 0547  NA 132* 132* 135 137 142  K 4.6 4.8 4.8 4.6 4.4  CL 96* 97* 98 96* 99  CO2 22 20* '23 27 29  ' GLUCOSE 143* 123* 124* 104* 153*  BUN 61* 75* 89* 97* 104*  CREATININE 3.64* 3.53* 3.05* 2.77* 2.54*  CALCIUM 8.1* 8.3* 8.4* 8.7* 8.8*  MG 1.9 2.1 2.1 2.0 2.2  AST 46* 96* 50* 44* 46*  ALT 28 57* 48* 39 36  ALKPHOS 85 91 101 111 89  BILITOT 1.1 0.7 0.7 1.1 0.9   ------------------------------------------------------------------------------------------------------------------ No results for input(s): CHOL, HDL, LDLCALC, TRIG, CHOLHDL, LDLDIRECT in the last 72 hours.  Lab Results  Component Value Date   HGBA1C 5.5 03/23/2018    ------------------------------------------------------------------------------------------------------------------ No results for input(s): TSH, T4TOTAL, T3FREE, THYROIDAB in the last 72 hours.  Invalid input(s): FREET3  Cardiac Enzymes No results for input(s): CKMB, TROPONINI, MYOGLOBIN in the last 168 hours.  Invalid input(s): CK ------------------------------------------------------------------------------------------------------------------    Component Value Date/Time   BNP 232.6 (H) 04/15/2019 0547    Micro Results Recent Results (from the past 240 hour(s))  Blood Culture (routine x 2)     Status: None   Collection Time: 04/05/2019  5:13 PM   Specimen: BLOOD RIGHT WRIST  Result Value Ref Range Status   Specimen Description BLOOD RIGHT WRIST  Final   Special Requests   Final    BOTTLES DRAWN AEROBIC AND ANAEROBIC Blood Culture results may not be optimal due to an inadequate volume of blood received in culture bottles   Culture   Final    NO GROWTH 5 DAYS Performed at [facility], [place] [address] [place], [place] [address]    Report Status 04/15/2019 FINAL  Final  Blood Culture (routine x 2)     Status: None   Collection Time: 04/18/2019  5:13 PM   Specimen: BLOOD RIGHT ARM  Result Value Ref Range Status   Specimen Description BLOOD RIGHT ARM  Final   Special Requests AEROBIC BOTTLE ONLY Blood Culture adequate volume  Final   Culture   Final    NO GROWTH 5 DAYS Performed at [facility], [place],  [place] [address]    Report Status 04/15/2019 FINAL  Final  SARS CORONAVIRUS 2 (TAT 6-24 HRS) Nasopharyngeal Nasopharyngeal Swab     Status: Abnormal   Collection Time: 05/05/2019  7:04 PM   Specimen: Nasopharyngeal Swab  Result Value Ref Range Status   SARS Coronavirus 2 POSITIVE (A) NEGATIVE Final    Comment: RESULT CALLED TO, READ BACK BY AND VERIFIED WITH: B.BECK,RN [id number] 04/11/19 G.MCADOO (NOTE) SARS-CoV-2 target nucleic acids are  DETECTED. The SARS-CoV-2 RNA is generally detectable in upper and lower respiratory specimens during the acute phase of infection. Positive results are indicative of the presence of SARS-CoV-2 RNA. Clinical correlation with patient history and other diagnostic information is  necessary to determine patient infection status. Positive results do not rule out bacterial infection or co-infection with other viruses.  The expected result is Negative. Fact Sheet for Patients: SugarRoll.be Fact Sheet for Healthcare Providers: https://www.woods-mathews.com/ This test is not yet approved or cleared by the [place] FDA and  has been authorized for detection and/or diagnosis of SARS-CoV-2 by FDA under an Emergency Use Authorization (EUA). This EUA will remain  in effect (meaning this test can be used) for the dura tion of the COVID-19 declaration under Section 564(b)(1) of the Act, 21 U.S.C. section 360bbb-3(b)(1), unless the authorization is terminated or revoked sooner. Performed at [facility], [place] [address] [place], [place] [address]     Radiology Reports CT Head Wo Contrast  Result Date: 03/29/2019 CLINICAL DATA:  Poly trauma, fell this evening striking head on floor; past history of breast cancer, hypertension, diabetes mellitus, paroxysmal atrial fibrillation EXAM: CT HEAD WITHOUT CONTRAST CT CERVICAL SPINE WITHOUT CONTRAST TECHNIQUE: Multidetector CT imaging of the head and cervical spine was performed following the standard protocol without intravenous contrast. Multiplanar CT image reconstructions of the cervical spine were also generated. COMPARISON:  CT head 03/09/2018, MRI cervical spine 09/10/2007 FINDINGS: CT HEAD FINDINGS Brain: Generalized atrophy. Normal ventricular morphology. No midline shift or mass effect. Mild small vessel chronic ischemic changes of deep cerebral white matter. Streak artifacts at temporal lobes due to  motion. Low-attenuation at LEFT occipital lobe, by sagittal images likely related to same artifacts. No intracranial hemorrhage, mass lesion or definite acute infarction. No extra-axial fluid collections. Vascular: Atherosclerotic calcifications of internal carotid arteries at skull base. Skull: Calvaria intact.  Scattered motion artifacts noted. Sinuses/Orbits: Clear Other: N/A CT CERVICAL SPINE FINDINGS Alignment: Anterolisthesis at C3-C4, unchanged. Remaining alignments normal. Skull base and vertebrae: Osseous demineralization. Skull base intact. Multilevel facet degenerative changes. Multilevel degenerative disc disease changes. Vertebral body heights maintained without fracture or bone destruction. Ankylosis of C4-C5 facet joints. Encroachment upon C3-C4 and C5-C6 neural foramina bilaterally as well as RIGHT C6-C7. Soft tissues and spinal canal: Prevertebral soft tissues normal thickness. Atherosclerotic calcifications within the carotid systems bilaterally. BILATERAL carotid bifurcations extend retropharyngeal. Additional atherosclerotic calcifications at aortic arch. Disc levels:  No additional abnormalities Upper chest: Scarring at RIGHT apex. Other: N/A IMPRESSION: Atrophy with small vessel chronic ischemic changes of deep cerebral white matter. No acute intracranial abnormalities identified on exam limited by mild patient motion artifacts. Degenerative disc and facet disease changes of the cervical spine. No acute cervical spine abnormalities. Electronically Signed   By: Lavonia Dana M.D.   On: 03/29/2019 21:05   CT Cervical Spine Wo Contrast  Result Date: 03/29/2019 CLINICAL DATA:  Poly trauma, fell this evening striking head on floor; past history of breast cancer, hypertension, diabetes mellitus, paroxysmal atrial fibrillation EXAM: CT HEAD  WITHOUT CONTRAST CT CERVICAL SPINE WITHOUT CONTRAST TECHNIQUE: Multidetector CT imaging of the head and cervical spine was performed following the standard  protocol without intravenous contrast. Multiplanar CT image reconstructions of the cervical spine were also generated. COMPARISON:  CT head 03/09/2018, MRI cervical spine 09/10/2007 FINDINGS: CT HEAD FINDINGS Brain: Generalized atrophy. Normal ventricular morphology. No midline shift or mass effect. Mild small vessel chronic ischemic changes of deep cerebral white matter. Streak artifacts at temporal lobes due to motion. Low-attenuation at LEFT occipital lobe, by sagittal images likely related to same artifacts. No intracranial hemorrhage, mass lesion or definite acute infarction. No extra-axial fluid collections. Vascular: Atherosclerotic calcifications of internal carotid arteries at skull base. Skull: Calvaria intact.  Scattered motion artifacts noted. Sinuses/Orbits: Clear Other: N/A CT CERVICAL SPINE FINDINGS Alignment: Anterolisthesis at C3-C4, unchanged. Remaining alignments normal. Skull base and vertebrae: Osseous demineralization. Skull base intact. Multilevel facet degenerative changes. Multilevel degenerative disc disease changes. Vertebral body heights maintained without fracture or bone destruction. Ankylosis of C4-C5 facet joints. Encroachment upon C3-C4 and C5-C6 neural foramina bilaterally as well as RIGHT C6-C7. Soft tissues and spinal canal: Prevertebral soft tissues normal thickness. Atherosclerotic calcifications within the carotid systems bilaterally. BILATERAL carotid bifurcations extend retropharyngeal. Additional atherosclerotic calcifications at aortic arch. Disc levels:  No additional abnormalities Upper chest: Scarring at RIGHT apex. Other: N/A IMPRESSION: Atrophy with small vessel chronic ischemic changes of deep cerebral white matter. No acute intracranial abnormalities identified on exam limited by mild patient motion artifacts. Degenerative disc and facet disease changes of the cervical spine. No acute cervical spine abnormalities. Electronically Signed   By: Lavonia Dana M.D.   On:  03/29/2019 21:05   US Renal  Result Date: 04/12/2019 CLINICAL DATA:  Acute kidney injury EXAM: RENAL / URINARY TRACT ULTRASOUND COMPLETE COMPARISON:  None. FINDINGS: Right Kidney: Renal measurements: 7.9 x 4.2 x 3.9 cm = volume: 65.8 mL. There is increased cortical echogenicity. There is no hydronephrosis. Left Kidney: Renal measurements: 8.7 x 4.7 x 3.6 cm = volume: 77 mL. There is increased cortical echogenicity. There is no hydronephrosis. Bladder: Appears normal for degree of bladder distention. Other: None. IMPRESSION: 1. No acute abnormality. 2. Echogenic kidneys bilaterally which can be seen in patients with medical renal disease. Electronically Signed   By: Constance Holster M.D.   On: 04/29/2019 23:41   DG CHEST PORT 1 VIEW  Result Date: 04/15/2019 CLINICAL DATA:  Oxygen desaturation. ARDS. COVID-19 virus infection. EXAM: PORTABLE CHEST 1 VIEW COMPARISON:  04/14/2019 FINDINGS: Heart size is stable. Diffuse mixed interstitial and airspace opacity throughout both lungs shows no significant change. No pneumothorax or pleural effusion visualized. IMPRESSION: No significant change in diffuse mixed interstitial and airspace opacity throughout both lungs. Electronically Signed   By: Marlaine Hind M.D.   On: 04/15/2019 08:06   DG Chest Port 1 View  Result Date: 04/14/2019 CLINICAL DATA:  COVID-19 positive, dyspnea EXAM: PORTABLE CHEST 1 VIEW COMPARISON:  04/09/2019 chest radiograph. FINDINGS: Stable cardiomediastinal silhouette with normal heart size. No pneumothorax. No pleural effusion. Extensive patchy opacities throughout both lungs appear worsened. IMPRESSION: Worsening extensive patchy opacities throughout both lungs, compatible with COVID-19 pneumonia. Electronically Signed   By: Ilona Sorrel M.D.   On: 04/14/2019 11:44   DG Chest Portable 1 View  Result Date: 04/21/2019 CLINICAL DATA:  Hypoxia. EXAM: PORTABLE CHEST 1 VIEW COMPARISON:  01/01/2019 FINDINGS: There are diffuse bilateral hazy  ground-glass airspace opacities, greatest within the left lung zone. The heart size is borderline enlarged. Aortic calcifications  are noted. There is no pneumothorax or large pleural effusion. There is no acute osseous abnormality. IMPRESSION: Diffuse bilateral ground-glass airspace opacities consistent with the patient's history of viral pneumonia. Electronically Signed   By: Constance Holster M.D.   On: 05/01/2019 16:26

## 2019-04-15 NOTE — Progress Notes (Signed)
Physical Therapy Treatment Patient Details Name: Emily Andrews MRN: [id number] DOB: [birth year] Today's Date: 04/15/2019    History of Present Illness 85 y/o female w/ hx of paroxysmal a-fib, HTN, HLD, dysrhythmia, DM, cancer, arthritis, anxiety. Pt had fall and hit head on 12/25 tested + COVID 3 weeks prior (to 1/6) was managing at home but had steady decline in function with coughing, congestion, decreased apetite and ambulation. Admitted with Pneumonia due to severe acute respiratory syndrome coronavirus 2 (SARS-CoV-2).    PT Comments    Pt with decline in respiratory status and tolerating very little functional activity.    Follow Up Recommendations  SNF     Equipment Recommendations  None recommended by PT    Recommendations for Other Services       Precautions / Restrictions Precautions Precautions: Fall;Other (comment) Precaution Comments: Watch O2    Mobility  Bed Mobility Overal bed mobility: Needs Assistance Bed Mobility: Supine to Sit;Sit to Supine;Rolling Rolling: Min guard   Supine to sit: Min assist Sit to supine: Min assist   General bed mobility comments: Assist to elevate trunk into sitting and bring hips to EOB. Assist to bring legs back into bed on return to supine.  Transfers                 General transfer comment: Did not attempt due to decr activity tolerance  Ambulation/Gait                 Stairs             Wheelchair Mobility    Modified Rankin (Stroke Patients Only)       Balance Overall balance assessment: Needs assistance Sitting-balance support: No upper extremity supported;Feet supported Sitting balance-Leahy Scale: Fair                                      Cognition Arousal/Alertness: Awake/alert Behavior During Therapy: WFL for tasks assessed/performed Overall Cognitive Status: Impaired/Different from baseline Area of Impairment: Following commands;Problem solving                        Following Commands: Follows one step commands with increased time     Problem Solving: Slow processing;Requires verbal cues        Exercises General Exercises - Lower Extremity Long Arc Quad: AROM;Both;15 reps;Seated    General Comments General comments (skin integrity, edema, etc.): Pt on 45L HFNC and 15L NRB. Pt with SpO2 dropping to mid 80''s with activity      Pertinent Vitals/Pain      Home Living                      Prior Function            PT Goals (current goals can now be found in the care plan section) Acute Rehab PT Goals Patient Stated Goal: to go home Progress towards PT goals: Not progressing toward goals - comment(respiratory decline)    Frequency    Min 2X/week      PT Plan Discharge plan needs to be updated;Frequency needs to be updated    Co-evaluation              AM-PAC PT "6 Clicks" Mobility   Outcome Measure  Help needed turning from your back to your side while in a flat bed without using bedrails?: A  Little Help needed moving from lying on your back to sitting on the side of a flat bed without using bedrails?: A Little Help needed moving to and from a bed to a chair (including a wheelchair)?: Total Help needed standing up from a chair using your arms (e.g., wheelchair or bedside chair)?: Total Help needed to walk in hospital room?: Total Help needed climbing 3-5 steps with a railing? : Total 6 Click Score: 10    End of Session Equipment Utilized During Treatment: Oxygen Activity Tolerance: Patient limited by fatigue;Treatment limited secondary to medical complications (Comment) Patient left: in bed;with call bell/phone within reach   PT Visit Diagnosis: Other abnormalities of gait and mobility (R26.89);Muscle weakness (generalized) (M62.81)     Time: 1146-4314 PT Time Calculation (min) (ACUTE ONLY): 21 min  Charges:  $Therapeutic Activity: 8-22 mins                     [place] Pager [phone] Office [place] 04/15/2019, 4:42 PM

## 2019-04-16 ENCOUNTER — Inpatient Hospital Stay
Admission: AD | Admit: 2019-04-16 | Payer: Medicare Other | Source: Other Acute Inpatient Hospital | Admitting: Internal Medicine

## 2019-04-16 ENCOUNTER — Inpatient Hospital Stay (HOSPITAL_COMMUNITY): Payer: Medicare Other

## 2019-04-16 LAB — CBC
HCT: 42 % (ref 36.0–46.0)
Hemoglobin: 13.7 g/dL (ref 12.0–15.0)
MCH: 30.2 pg (ref 26.0–34.0)
MCHC: 32.6 g/dL (ref 30.0–36.0)
MCV: 92.5 fL (ref 80.0–100.0)
Platelets: 300 10*3/uL (ref 150–400)
RBC: 4.54 MIL/uL (ref 3.87–5.11)
RDW: 14.7 % (ref 11.5–15.5)
WBC: 8.6 10*3/uL (ref 4.0–10.5)
nRBC: 0 % (ref 0.0–0.2)

## 2019-04-16 LAB — BASIC METABOLIC PANEL
Anion gap: 16 — ABNORMAL HIGH (ref 5–15)
BUN: 99 mg/dL — ABNORMAL HIGH (ref 8–23)
CO2: 29 mmol/L (ref 22–32)
Calcium: 9 mg/dL (ref 8.9–10.3)
Chloride: 98 mmol/L (ref 98–111)
Creatinine, Ser: 2.21 mg/dL — ABNORMAL HIGH (ref 0.44–1.00)
GFR calc Af Amer: 23 mL/min — ABNORMAL LOW (ref >60)
GFR calc non Af Amer: 20 mL/min — ABNORMAL LOW (ref >60)
Glucose, Bld: 147 mg/dL — ABNORMAL HIGH (ref 70–99)
Potassium: 3.8 mmol/L (ref 3.5–5.1)
Sodium: 143 mmol/L (ref 135–145)

## 2019-04-16 LAB — BRAIN NATRIURETIC PEPTIDE: B Natriuretic Peptide: 102.8 pg/mL — ABNORMAL HIGH (ref 0.0–100.0)

## 2019-04-16 LAB — MAGNESIUM: Magnesium: 2 mg/dL (ref 1.7–2.4)

## 2019-04-16 LAB — PROCALCITONIN: Procalcitonin: <0.1 ng/mL

## 2019-04-16 LAB — C-REACTIVE PROTEIN: CRP: 1.4 mg/dL — ABNORMAL HIGH (ref <1.0)

## 2019-04-16 LAB — D-DIMER, QUANTITATIVE: D-Dimer, Quant: 10.94 ug/mL-FEU — ABNORMAL HIGH (ref 0.00–0.50)

## 2019-04-16 MED ORDER — METOLAZONE 2.5 MG PO TABS
2.5000 mg | ORAL_TABLET | Freq: Once | ORAL | Status: AC
  Administered 2019-04-16: 2.5 mg via ORAL
  Filled 2019-04-16: qty 1

## 2019-04-16 MED ORDER — ALPRAZOLAM 0.5 MG PO TABS
0.5000 mg | ORAL_TABLET | Freq: Three times a day (TID) | ORAL | Status: DC | PRN
  Administered 2019-04-16 – 2019-04-20 (×6): 0.5 mg via ORAL
  Filled 2019-04-16 (×6): qty 1

## 2019-04-16 NOTE — Progress Notes (Signed)
PROGRESS NOTE                                                                                                                                                                                                             Patient Demographics:    Emily Andrews, is a 85 y.o. female, DOB - 04/13/1934, ZHG:[id number]  Outpatient Primary MD for the patient is Orpah Melter, MD    LOS - 6  Admit date - 04/27/2019    Chief Complaint  Patient presents with  . Shortness of Breath    Covid+       Brief Narrative   85 year old Caucasian female with past medical history significant for proximal atrial fibrillation, [place] vas 2 score of at least 3 on Coumadin, hypertension, hyperlipidemia and diabetes mellitus.  Apparently, patient tested positive for COVID-19 virus infection about 3 weeks ago, she presented with shortness of breath was diagnosed with acute hypoxic respiratory failure due to COVID-19 pneumonia and admitted to hospital.   Subjective:   Patient in bed, appears fatigued, denies any headache, no fever, no chest pain or pressure, +ve shortness of breath , no abdominal pain. No focal weakness.    Assessment  & Plan :     1. Acute Hypoxic Resp. Failure due to Acute Covid 19 Viral Pneumonitis during the ongoing 2020 Covid 19 Pandemic - she has severe disease, she has been placed on IV steroids, remdesivir and received Actemra dose upon arrival to [facility], she also had a lot of fluid overload and CHF and was diuresed with IV Lasix.  She improved for a day or 2 however subsequently started to get more and more hypoxic, Actemra dose will be repeated on 04/15/2019.  Also she is currently on Lovenox as her D-dimer was creeping up.    She is extremely tenuous.  Had detailed discussion with patient's daughter again on 04/16/19, she remains DNR.  If any further decline full comfort measures.   Encouraged the patient to sit up in chair in the  daytime use I-S and flutter valve for pulmonary toiletry and then prone in bed when at night.   SpO2: 94 % O2 Flow Rate (L/min): 45 L/min FiO2 (%): 100 %   Recent Labs  Lab 04/23/2019 1633 04/30/2019 1700 04/09/2019 1904 04/11/19 0724 04/12/19 0115 04/13/19 0404 04/14/19 0420 04/15/19 0547 04/16/19 0800  04/16/19 0840  CRP   < > 21.0*  --  18.9* 13.2* 7.7* 4.4* 2.6*  --  1.4*  DDIMER   < >  --   --  1.20* 2.21* >20.00* 17.22* 13.53*  --  10.94*  FERRITIN  --  640*  --   --   --   --   --   --   --   --   BNP  --   --   --  771.0* 392.1* 356.5* 162.4* 232.6*  --   --   PROCALCITON  --   --   --   --  0.53 0.39 0.34 0.28 <0.10  --   SARSCOV2NAA  --   --  POSITIVE*  --   --   --   --   --   --   --    < > = values in this interval not displayed.    Hepatic Function Latest Ref Rng & Units 04/15/2019 04/14/2019 04/13/2019  Total Protein 6.5 - 8.1 g/dL 5.9(L) 5.9(L) 5.9(L)  Albumin 3.5 - 5.0 g/dL 2.4(L) 2.2(L) 2.0(L)  AST 15 - 41 U/L 46(H) 44(H) 50(H)  ALT 0 - 44 U/L 36 39 48(H)  Alk Phosphatase 38 - 126 U/L 89 111 101  Total Bilirubin 0.3 - 1.2 mg/dL 0.9 1.1 0.7  Bilirubin, Direct 0.0 - 0.3 mg/dL - - -     2.  Acute on chronic diastolic CHF EF 29% on echocardiogram done in 2019- continue IV Lasix and Zaroxolyn.  Continue to monitor.  3.  AKI -could be ATN due to recent infection, was also on ARB.  Renal ultrasound nonacute but does show chronic kidney disease.  Last creatinine was under 1.2 in 2019, clinically she looks to be in fluid overload and she is responding well to ongoing diuresis with IV Lasix and Zaroxolyn which will be continued, renal following with a distance.  4.  Paroxysmal atrial fibrillation.  [place] vas 2 score was greater than 3, she was on Coumadin with erratic INR control, when she was admitted her INR was greater than 10, Coumadin has been reversed with vitamin K IV, INR stable will be transitioned to Lovenox  now.   5.? UTI. Finished Keflex for a total of 3  days.  6.  Spike in D-dimer.  This happened after her vitamin K was reversed, question if she has thrown a clot, although she is on Eliquis will challenge her with a few days of Lovenox starting on 04/14/2019, she has also become quite short of breath after improvement on 04/14/2019.  Will monitor closely.    Condition - Guarded +++  Family Communication  :  Daughter 1/7, 1/9, 1/10, 1/11, 1/12  Code Status :  DNR  Diet :   Diet Order            Diet heart healthy/carb modified Room service appropriate? Yes; Fluid consistency: Thin  Diet effective now               Disposition Plan  :  TBD  Consults  :  None  Procedures  :    Renal US - non acute  PUD Prophylaxis : PPI  DVT Prophylaxis  : Coumadin/Eliquis/Lovenox  Lab Results  Component Value Date   INR 2.0 (H) 04/12/2019   INR >10.0 (HH) 04/11/2019   INR >10.0 (HH) 04/30/2019     Lab Results  Component Value Date   PLT 300 04/16/2019    Inpatient Medications  Scheduled Meds: . [  START ON 04/18/2019] apixaban  2.5 mg Oral BID  . vitamin C  500 mg Oral Daily  . dexamethasone (DECADRON) injection  10 mg Intravenous Q24H  . enoxaparin (LOVENOX) injection  1 mg/kg Subcutaneous Q24H  . ezetimibe  10 mg Oral QPM  . flecainide  50 mg Oral BID  . folic acid  1 mg Oral Daily  . furosemide  80 mg Intravenous BID  . Ipratropium-Albuterol  1 puff Inhalation Q6H  . multivitamin with minerals  1 tablet Oral Daily  . pantoprazole  40 mg Oral BID  . QUEtiapine  25 mg Oral BID  . tamoxifen  20 mg Oral Daily  . thiamine  100 mg Oral Daily  . cyanocobalamin  100 mcg Oral Daily  . zinc sulfate  220 mg Oral Daily   Continuous Infusions:  PRN Meds:.fluticasone, ondansetron (ZOFRAN) IV, sodium chloride  Antibiotics  :    Anti-infectives (From admission, onward)   Start     Dose/Rate Route Frequency Ordered Stop   04/12/19 1000  remdesivir 100 mg in sodium chloride 0.9 % 100 mL IVPB  Status:  Discontinued     100  mg 200 mL/hr over 30 Minutes Intravenous Daily 04/11/19 0433 04/11/19 0446   04/12/19 1000  remdesivir 100 mg in sodium chloride 0.9 % 100 mL IVPB     100 mg 200 mL/hr over 30 Minutes Intravenous Daily 04/11/19 0447 04/15/19 1936   04/12/19 0800  cephALEXin (KEFLEX) capsule 250 mg     250 mg Oral Daily 04/12/19 0753 04/16/19 0802   04/12/19 0745  cephALEXin (KEFLEX) capsule 500 mg  Status:  Discontinued     500 mg Oral Every 12 hours 04/12/19 0736 04/12/19 0751   04/11/19 1000  remdesivir 100 mg in sodium chloride 0.9 % 100 mL IVPB  Status:  Discontinued     100 mg 200 mL/hr over 30 Minutes Intravenous Daily 04/12/2019 2234 04/11/19 0447   04/11/19 0500  azithromycin (ZITHROMAX) 500 mg in sodium chloride 0.9 % 250 mL IVPB  Status:  Discontinued     500 mg 250 mL/hr over 60 Minutes Intravenous Daily 04/11/19 0433 04/11/19 0734   04/11/19 0500  cefTRIAXone (ROCEPHIN) 1 g in sodium chloride 0.9 % 100 mL IVPB  Status:  Discontinued     1 g 200 mL/hr over 30 Minutes Intravenous Daily 04/11/19 0433 04/11/19 0734   04/11/19 0445  remdesivir 200 mg in sodium chloride 0.9% 250 mL IVPB  Status:  Discontinued     200 mg 580 mL/hr over 30 Minutes Intravenous Once 04/11/19 0433 04/11/19 0551   04/26/2019 2245  remdesivir 200 mg in sodium chloride 0.9% 250 mL IVPB     200 mg 580 mL/hr over 30 Minutes Intravenous Once 04/09/2019 2234 04/11/19 0618       Time Spent in minutes  30   Lala Lund M.D on 04/16/2019 at 11:45 AM  To page go to [email or web address] - password [facility]  [facility] -  Office  [phone]    See all Orders from today for further details    Objective:   Vitals:   04/16/19 0800 04/16/19 0900 04/16/19 1000 04/16/19 1139  BP: 121/79   120/66  Pulse: 86 86  88  Resp: (!) 21 (!) 22 (!) 23 (!) 25  Temp: 98.7 F (37.1 C)   98.6 F (37 C)  TempSrc: Axillary   Axillary  SpO2: 94% 94%    Weight:      Height:  Wt Readings from Last 3 Encounters:  04/19/2019 73.5 kg   03/29/19 73.5 kg  02/04/19 75.1 kg     Intake/Output Summary (Last 24 hours) at 04/16/2019 1145 Last data filed at 04/16/2019 1029 Gross per 24 hour  Intake 810 ml  Output 2400 ml  Net -1590 ml     Physical Exam  Awake , appears slightly fatigued, No new F.N deficits, Normal affect [place].AT,PERRAL Supple Neck,No JVD, No cervical lymphadenopathy appriciated.  Symmetrical Chest wall movement, Good air movement bilaterally, + rales RRR,No Gallops, Rubs or new Murmurs, No Parasternal Heave +ve B.Sounds, Abd Soft, No tenderness, No organomegaly appriciated, No rebound - guarding or rigidity. No Cyanosis, Clubbing or edema, No new Rash or bruise    Data Review:    CBC Recent Labs  Lab 04/11/19 0724 04/12/19 0115 04/13/19 0404 04/14/19 0420 04/15/19 0547 04/16/19 0840  WBC 13.3* 10.7* 11.5* 8.8 9.8 8.6  HGB 11.1* 11.2* 12.8 13.6 13.4 13.7  HCT 33.7* 33.5* 37.5 40.9 40.8 42.0  PLT 419* 413* 301 295 279 300  MCV 91.3 90.3 89.1 90.9 92.7 92.5  MCH 30.1 30.2 30.4 30.2 30.5 30.2  MCHC 32.9 33.4 34.1 33.3 32.8 32.6  RDW 13.9 13.9 14.0 14.6 14.9 14.7  LYMPHSABS 1.2 1.1 1.1 1.4 1.2  --   MONOABS 0.6 0.5 0.8 0.4 0.4  --   EOSABS 0.0 0.0 0.0 0.1 0.0  --   BASOSABS 0.0 0.0 0.0 0.0 0.0  --     Chemistries  Recent Labs  Lab 04/11/19 0724 04/12/19 0115 04/13/19 0404 04/14/19 0420 04/15/19 0547 04/16/19 0840  NA 132* 132* 135 137 142 143  K 4.6 4.8 4.8 4.6 4.4 3.8  CL 96* 97* 98 96* 99 98  CO2 22 20* _0 GLUCOSE 143* 123* 124* 104* 153* 147*  BUN 61* 75* 89* 97* 104* 99*  CREATININE 3.64* 3.53* 3.05* 2.77* 2.54* 2.21*  CALCIUM 8.1* 8.3* 8.4* 8.7* 8.8* 9.0  MG 1.9 2.1 2.1 2.0 2.2 2.0  AST 46* 96* 50* 44* 46*  --   ALT 28 57* 48* 39 36  --   ALKPHOS 85 91 101 111 89  --   BILITOT 1.1 0.7 0.7 1.1 0.9  --    ------------------------------------------------------------------------------------------------------------------ No results for input(s): CHOL, HDL,  LDLCALC, TRIG, CHOLHDL, LDLDIRECT in the last 72 hours.  Lab Results  Component Value Date   HGBA1C 5.5 03/23/2018   ------------------------------------------------------------------------------------------------------------------ No results for input(s): TSH, T4TOTAL, T3FREE, THYROIDAB in the last 72 hours.  Invalid input(s): FREET3  Cardiac Enzymes No results for input(s): CKMB, TROPONINI, MYOGLOBIN in the last 168 hours.  Invalid input(s): CK ------------------------------------------------------------------------------------------------------------------    Component Value Date/Time   BNP 232.6 (H) 04/15/2019 0547    Micro Results Recent Results (from the past 240 hour(s))  Blood Culture (routine x 2)     Status: None   Collection Time: 04/07/2019  5:13 PM   Specimen: BLOOD RIGHT WRIST  Result Value Ref Range Status   Specimen Description BLOOD RIGHT WRIST  Final   Special Requests   Final    BOTTLES DRAWN AEROBIC AND ANAEROBIC Blood Culture results may not be optimal due to an inadequate volume of blood received in culture bottles   Culture   Final    NO GROWTH 5 DAYS Performed at [facility], [place] [address] [place], [place] [address]    Report Status 04/15/2019 FINAL  Final  Blood Culture (routine x 2)  Status: None   Collection Time: 04/25/2019  5:13 PM   Specimen: BLOOD RIGHT ARM  Result Value Ref Range Status   Specimen Description BLOOD RIGHT ARM  Final   Special Requests AEROBIC BOTTLE ONLY Blood Culture adequate volume  Final   Culture   Final    NO GROWTH 5 DAYS Performed at [facility], [address], [place], [place] [address]    Report Status 04/15/2019 FINAL  Final  SARS CORONAVIRUS 2 (TAT 6-24 HRS) Nasopharyngeal Nasopharyngeal Swab     Status: Abnormal   Collection Time: 04/11/2019  7:04 PM   Specimen: Nasopharyngeal Swab  Result Value Ref Range Status   SARS Coronavirus 2 POSITIVE (A) NEGATIVE Final    Comment: RESULT CALLED TO,  READ BACK BY AND VERIFIED WITH: B.BECK,RN [id number] 04/11/19 G.MCADOO (NOTE) SARS-CoV-2 target nucleic acids are DETECTED. The SARS-CoV-2 RNA is generally detectable in upper and lower respiratory specimens during the acute phase of infection. Positive results are indicative of the presence of SARS-CoV-2 RNA. Clinical correlation with patient history and other diagnostic information is  necessary to determine patient infection status. Positive results do not rule out bacterial infection or co-infection with other viruses.  The expected result is Negative. Fact Sheet for Patients: SugarRoll.be Fact Sheet for Healthcare Providers: https://www.woods-mathews.com/ This test is not yet approved or cleared by the [place] FDA and  has been authorized for detection and/or diagnosis of SARS-CoV-2 by FDA under an Emergency Use Authorization (EUA). This EUA will remain  in effect (meaning this test can be used) for the dura tion of the COVID-19 declaration under Section 564(b)(1) of the Act, 21 U.S.C. section 360bbb-3(b)(1), unless the authorization is terminated or revoked sooner. Performed at [facility], [place] [address] [place], [place] [address]     Radiology Reports CT Head Wo Contrast  Result Date: 03/29/2019 CLINICAL DATA:  Poly trauma, fell this evening striking head on floor; past history of breast cancer, hypertension, diabetes mellitus, paroxysmal atrial fibrillation EXAM: CT HEAD WITHOUT CONTRAST CT CERVICAL SPINE WITHOUT CONTRAST TECHNIQUE: Multidetector CT imaging of the head and cervical spine was performed following the standard protocol without intravenous contrast. Multiplanar CT image reconstructions of the cervical spine were also generated. COMPARISON:  CT head 03/09/2018, MRI cervical spine 09/10/2007 FINDINGS: CT HEAD FINDINGS Brain: Generalized atrophy. Normal ventricular morphology. No midline shift or mass effect. Mild small  vessel chronic ischemic changes of deep cerebral white matter. Streak artifacts at temporal lobes due to motion. Low-attenuation at LEFT occipital lobe, by sagittal images likely related to same artifacts. No intracranial hemorrhage, mass lesion or definite acute infarction. No extra-axial fluid collections. Vascular: Atherosclerotic calcifications of internal carotid arteries at skull base. Skull: Calvaria intact.  Scattered motion artifacts noted. Sinuses/Orbits: Clear Other: N/A CT CERVICAL SPINE FINDINGS Alignment: Anterolisthesis at C3-C4, unchanged. Remaining alignments normal. Skull base and vertebrae: Osseous demineralization. Skull base intact. Multilevel facet degenerative changes. Multilevel degenerative disc disease changes. Vertebral body heights maintained without fracture or bone destruction. Ankylosis of C4-C5 facet joints. Encroachment upon C3-C4 and C5-C6 neural foramina bilaterally as well as RIGHT C6-C7. Soft tissues and spinal canal: Prevertebral soft tissues normal thickness. Atherosclerotic calcifications within the carotid systems bilaterally. BILATERAL carotid bifurcations extend retropharyngeal. Additional atherosclerotic calcifications at aortic arch. Disc levels:  No additional abnormalities Upper chest: Scarring at RIGHT apex. Other: N/A IMPRESSION: Atrophy with small vessel chronic ischemic changes of deep cerebral white matter. No acute intracranial abnormalities identified on exam limited by mild patient  motion artifacts. Degenerative disc and facet disease changes of the cervical spine. No acute cervical spine abnormalities. Electronically Signed   By: Lavonia Dana M.D.   On: 03/29/2019 21:05   CT Cervical Spine Wo Contrast  Result Date: 03/29/2019 CLINICAL DATA:  Poly trauma, fell this evening striking head on floor; past history of breast cancer, hypertension, diabetes mellitus, paroxysmal atrial fibrillation EXAM: CT HEAD WITHOUT CONTRAST CT CERVICAL SPINE WITHOUT CONTRAST  TECHNIQUE: Multidetector CT imaging of the head and cervical spine was performed following the standard protocol without intravenous contrast. Multiplanar CT image reconstructions of the cervical spine were also generated. COMPARISON:  CT head 03/09/2018, MRI cervical spine 09/10/2007 FINDINGS: CT HEAD FINDINGS Brain: Generalized atrophy. Normal ventricular morphology. No midline shift or mass effect. Mild small vessel chronic ischemic changes of deep cerebral white matter. Streak artifacts at temporal lobes due to motion. Low-attenuation at LEFT occipital lobe, by sagittal images likely related to same artifacts. No intracranial hemorrhage, mass lesion or definite acute infarction. No extra-axial fluid collections. Vascular: Atherosclerotic calcifications of internal carotid arteries at skull base. Skull: Calvaria intact.  Scattered motion artifacts noted. Sinuses/Orbits: Clear Other: N/A CT CERVICAL SPINE FINDINGS Alignment: Anterolisthesis at C3-C4, unchanged. Remaining alignments normal. Skull base and vertebrae: Osseous demineralization. Skull base intact. Multilevel facet degenerative changes. Multilevel degenerative disc disease changes. Vertebral body heights maintained without fracture or bone destruction. Ankylosis of C4-C5 facet joints. Encroachment upon C3-C4 and C5-C6 neural foramina bilaterally as well as RIGHT C6-C7. Soft tissues and spinal canal: Prevertebral soft tissues normal thickness. Atherosclerotic calcifications within the carotid systems bilaterally. BILATERAL carotid bifurcations extend retropharyngeal. Additional atherosclerotic calcifications at aortic arch. Disc levels:  No additional abnormalities Upper chest: Scarring at RIGHT apex. Other: N/A IMPRESSION: Atrophy with small vessel chronic ischemic changes of deep cerebral white matter. No acute intracranial abnormalities identified on exam limited by mild patient motion artifacts. Degenerative disc and facet disease changes of the  cervical spine. No acute cervical spine abnormalities. Electronically Signed   By: Lavonia Dana M.D.   On: 03/29/2019 21:05   US Renal  Result Date: 04/11/2019 CLINICAL DATA:  Acute kidney injury EXAM: RENAL / URINARY TRACT ULTRASOUND COMPLETE COMPARISON:  None. FINDINGS: Right Kidney: Renal measurements: 7.9 x 4.2 x 3.9 cm = volume: 65.8 mL. There is increased cortical echogenicity. There is no hydronephrosis. Left Kidney: Renal measurements: 8.7 x 4.7 x 3.6 cm = volume: 77 mL. There is increased cortical echogenicity. There is no hydronephrosis. Bladder: Appears normal for degree of bladder distention. Other: None. IMPRESSION: 1. No acute abnormality. 2. Echogenic kidneys bilaterally which can be seen in patients with medical renal disease. Electronically Signed   By: Constance Holster M.D.   On: 04/29/2019 23:41   DG Chest Port 1 View  Result Date: 04/16/2019 CLINICAL DATA:  Shortness of breath COVID-19 pneumonia. EXAM: PORTABLE CHEST 1 VIEW COMPARISON:  One-view chest x-ray 04/14/2019 FINDINGS: Heart size is stable. Atherosclerotic calcifications present at the aortic arch. Progressive interstitial and airspace opacities are present in both lungs, consistent with worsening pneumonia. There is new gaseous distention of the stomach. IMPRESSION: 1. Progressive interstitial and airspace disease in both lungs compatible with worsening pneumonia. 2. New gaseous distention of the stomach. 3. Aortic atherosclerosis. Electronically Signed   By: San Morelle M.D.   On: 04/16/2019 08:45   DG CHEST PORT 1 VIEW  Result Date: 04/15/2019 CLINICAL DATA:  Oxygen desaturation. ARDS. COVID-19 virus infection. EXAM: PORTABLE CHEST 1 VIEW COMPARISON:  04/14/2019 FINDINGS: Heart size  is stable. Diffuse mixed interstitial and airspace opacity throughout both lungs shows no significant change. No pneumothorax or pleural effusion visualized. IMPRESSION: No significant change in diffuse mixed interstitial and airspace  opacity throughout both lungs. Electronically Signed   By: Marlaine Hind M.D.   On: 04/15/2019 08:06   DG Chest Port 1 View  Result Date: 04/14/2019 CLINICAL DATA:  COVID-19 positive, dyspnea EXAM: PORTABLE CHEST 1 VIEW COMPARISON:  04/19/2019 chest radiograph. FINDINGS: Stable cardiomediastinal silhouette with normal heart size. No pneumothorax. No pleural effusion. Extensive patchy opacities throughout both lungs appear worsened. IMPRESSION: Worsening extensive patchy opacities throughout both lungs, compatible with COVID-19 pneumonia. Electronically Signed   By: Ilona Sorrel M.D.   On: 04/14/2019 11:44   DG Chest Portable 1 View  Result Date: 05/02/2019 CLINICAL DATA:  Hypoxia. EXAM: PORTABLE CHEST 1 VIEW COMPARISON:  01/01/2019 FINDINGS: There are diffuse bilateral hazy ground-glass airspace opacities, greatest within the left lung zone. The heart size is borderline enlarged. Aortic calcifications are noted. There is no pneumothorax or large pleural effusion. There is no acute osseous abnormality. IMPRESSION: Diffuse bilateral ground-glass airspace opacities consistent with the patient's history of viral pneumonia. Electronically Signed   By: Constance Holster M.D.   On: 04/09/2019 16:26

## 2019-04-17 ENCOUNTER — Other Ambulatory Visit: Payer: Medicare Other

## 2019-04-17 LAB — BRAIN NATRIURETIC PEPTIDE: B Natriuretic Peptide: 70.9 pg/mL (ref 0.0–100.0)

## 2019-04-17 LAB — CBC WITH DIFFERENTIAL/PLATELET
Abs Immature Granulocytes: 0.07 10*3/uL (ref 0.00–0.07)
Basophils Absolute: 0 10*3/uL (ref 0.0–0.1)
Basophils Relative: 0 %
Eosinophils Absolute: 0.1 10*3/uL (ref 0.0–0.5)
Eosinophils Relative: 2 %
HCT: 43.6 % (ref 36.0–46.0)
Hemoglobin: 14.1 g/dL (ref 12.0–15.0)
Immature Granulocytes: 1 %
Lymphocytes Relative: 16 %
Lymphs Abs: 1.3 10*3/uL (ref 0.7–4.0)
MCH: 30 pg (ref 26.0–34.0)
MCHC: 32.3 g/dL (ref 30.0–36.0)
MCV: 92.8 fL (ref 80.0–100.0)
Monocytes Absolute: 0.3 10*3/uL (ref 0.1–1.0)
Monocytes Relative: 4 %
Neutro Abs: 6.4 10*3/uL (ref 1.7–7.7)
Neutrophils Relative %: 77 %
Platelets: 321 10*3/uL (ref 150–400)
RBC: 4.7 MIL/uL (ref 3.87–5.11)
RDW: 14.9 % (ref 11.5–15.5)
WBC: 8.2 10*3/uL (ref 4.0–10.5)
nRBC: 0 % (ref 0.0–0.2)

## 2019-04-17 LAB — C-REACTIVE PROTEIN: CRP: 0.9 mg/dL (ref <1.0)

## 2019-04-17 LAB — COMPREHENSIVE METABOLIC PANEL
ALT: 26 U/L (ref 0–44)
AST: 35 U/L (ref 15–41)
Albumin: 2.9 g/dL — ABNORMAL LOW (ref 3.5–5.0)
Alkaline Phosphatase: 83 U/L (ref 38–126)
Anion gap: 17 — ABNORMAL HIGH (ref 5–15)
BUN: 108 mg/dL — ABNORMAL HIGH (ref 8–23)
CO2: 33 mmol/L — ABNORMAL HIGH (ref 22–32)
Calcium: 9.1 mg/dL (ref 8.9–10.3)
Chloride: 95 mmol/L — ABNORMAL LOW (ref 98–111)
Creatinine, Ser: 2.08 mg/dL — ABNORMAL HIGH (ref 0.44–1.00)
GFR calc Af Amer: 25 mL/min — ABNORMAL LOW (ref >60)
GFR calc non Af Amer: 22 mL/min — ABNORMAL LOW (ref >60)
Glucose, Bld: 165 mg/dL — ABNORMAL HIGH (ref 70–99)
Potassium: 3.9 mmol/L (ref 3.5–5.1)
Sodium: 145 mmol/L (ref 135–145)
Total Bilirubin: 1.7 mg/dL — ABNORMAL HIGH (ref 0.3–1.2)
Total Protein: 6.2 g/dL — ABNORMAL LOW (ref 6.5–8.1)

## 2019-04-17 LAB — GLUCOSE, CAPILLARY: Glucose-Capillary: 166 mg/dL — ABNORMAL HIGH (ref 70–99)

## 2019-04-17 LAB — MAGNESIUM: Magnesium: 2.1 mg/dL (ref 1.7–2.4)

## 2019-04-17 LAB — D-DIMER, QUANTITATIVE: D-Dimer, Quant: 9.36 ug/mL-FEU — ABNORMAL HIGH (ref 0.00–0.50)

## 2019-04-17 MED ORDER — SENNOSIDES-DOCUSATE SODIUM 8.6-50 MG PO TABS: 2.0000 | ORAL_TABLET | Freq: Two times a day (BID) | ORAL | Status: DC

## 2019-04-17 MED ORDER — ENOXAPARIN SODIUM 80 MG/0.8ML ~~LOC~~ SOLN
1.0000 mg/kg | SUBCUTANEOUS | Status: DC
  Administered 2019-04-17 – 2019-04-19 (×3): 75 mg via SUBCUTANEOUS
  Filled 2019-04-17 (×3): qty 0.8

## 2019-04-17 MED ORDER — BISACODYL 5 MG PO TBEC
5.0000 mg | DELAYED_RELEASE_TABLET | Freq: Every day | ORAL | Status: DC | PRN
  Administered 2019-04-20: 5 mg via ORAL
  Filled 2019-04-17 (×2): qty 1

## 2019-04-17 NOTE — Progress Notes (Signed)
Pt had difficulty taking pills this morning.  Previous nurse said she had been taking them whole without difficulty.  Will crush pills in apple sauce from now on.  Pt did not really want lunch but did want apple sauce.  After eating about half, she stated she was having difficulty swallowing.  Shortly after that, she desatted to 80%.  Turned HHFNC up to 100% 40L from 100% 30L to get patient to 91.  Contacted MD.  SLP consulted.  Will continue to monitor.

## 2019-04-17 NOTE — Progress Notes (Signed)
PROGRESS NOTE                                                                                                                                                                                                             Patient Demographics:    Emily Andrews, is a 85 y.o. female, DOB - [birth year], ZOX:[id number]  Outpatient Primary MD for the patient is Orpah Melter, MD    LOS - 7  Admit date - 04/16/2019    Chief Complaint  Patient presents with  . Shortness of Breath    Covid+       Brief Narrative   85 year old Caucasian female with past medical history significant for proximal atrial fibrillation, [place] vas 2 score of at least 3 on Coumadin, hypertension, hyperlipidemia and diabetes mellitus.  Apparently, patient tested positive for COVID-19 virus infection about 3 weeks ago, she presented with shortness of breath was diagnosed with acute hypoxic respiratory failure due to COVID-19 pneumonia and admitted to hospital.   Subjective:   Patient in bed, appears fatigued, denies any headache, no fever, no chest pain or pressure, she did shock earlier this morning when she took her meds  Assessment  & Plan :     1. Acute Hypoxic Resp. Failure due to Acute Covid 19 Viral Pneumonitis during the ongoing 2020 Covid 19 Pandemic - she has severe disease, she has been placed on IV steroids, remdesivir and received Actemra dose upon arrival to [facility], she also had a lot of fluid overload and CHF and was diuresed with IV Lasix.  She improved for a day or 2 however subsequently started to get more and more hypoxic, she received Actemra x2, Also she is currently on Lovenox as her D-dimer was creeping up.  He is with increased oxygen requirement, this morning she is on 40 L / 100% heated high flow. Patient is extremely frail, tenuous, and extremely at high risk for decompensation She is extremely tenuous.  Previous MD had detailed discussion  with patient's daughter again on 04/16/19, she remains DNR.  If any further decline full comfort measures.   Encouraged the patient to sit up in chair in the daytime use I-S and flutter valve for pulmonary toiletry and then prone in bed when at night.   SpO2: 91 % O2 Flow Rate (L/min): 40 L/min FiO2 (%): 100 %  Recent Labs  Lab 04/15/2019 1700 05/01/2019 1904 04/12/19 0115 04/13/19 0404 04/14/19 0420 04/15/19 0547 04/16/19 0800 04/16/19 0840 04/17/19 0355  CRP 21.0*  --  13.2* 7.7* 4.4* 2.6*  --  1.4* 0.9  DDIMER  --   --  2.21* >20.00* 17.22* 13.53*  --  10.94* 9.36*  FERRITIN 640*  --   --   --   --   --   --   --   --   BNP  --   --  392.1* 356.5* 162.4* 232.6*  --  102.8* 70.9  PROCALCITON  --   --  0.53 0.39 0.34 0.28 <0.10  --   --   SARSCOV2NAA  --  POSITIVE*  --   --   --   --   --   --   --     Hepatic Function Latest Ref Rng & Units 04/17/2019 04/15/2019 04/14/2019  Total Protein 6.5 - 8.1 g/dL 6.2(L) 5.9(L) 5.9(L)  Albumin 3.5 - 5.0 g/dL 2.9(L) 2.4(L) 2.2(L)  AST 15 - 41 U/L 35 46(H) 44(H)  ALT 0 - 44 U/L 26 36 39  Alk Phosphatase 38 - 126 U/L 83 89 111  Total Bilirubin 0.3 - 1.2 mg/dL 1.7(H) 0.9 1.1  Bilirubin, Direct 0.0 - 0.3 mg/dL - - -     2.  Acute on chronic diastolic CHF EF 03% on echocardiogram done in 2019- continue IV Lasix and Zaroxolyn.  Continue to monitor.  3.  AKI -could be ATN due to recent infection, was also on ARB.  Renal ultrasound nonacute but does show chronic kidney disease.  Last creatinine was under 1.2 in 2019, clinically she looks to be in fluid overload and she is responding well to ongoing diuresis with IV Lasix and Zaroxolyn which will be continued, renal following with a distance.  Continue with current diuresis especially with stable renal function with a creatinine around 2.  4.  Paroxysmal atrial fibrillation.  [place] vas 2 score was greater than 3, she was on Coumadin with erratic INR control, when she was admitted her INR was greater  than 10, Coumadin has been reversed with vitamin K IV, INR stable will be transitioned to Lovenox  now.   5.? UTI. Finished Keflex for a total of 3 days.  6.  Spike in D-dimer.  This happened after her vitamin K was reversed, question if she has thrown a clot, although she is on Eliquis will challenge her with a few days of Lovenox starting on 04/14/2019, she has also become quite short of breath after improvement on 04/14/2019.  Will monitor closely.    Condition - Guarded +++  Family Communication  :  Daughter 1/7, 1/9, 1/10, 1/11, 1/12  Code Status :  DNR  Diet :   Diet Order            Diet heart healthy/carb modified Room service appropriate? Yes; Fluid consistency: Thin  Diet effective now               Disposition Plan  :  TBD  Consults  :  None  Procedures  :    Renal US - non acute  PUD Prophylaxis : PPI  DVT Prophylaxis  : Coumadin/Eliquis/Lovenox  Lab Results  Component Value Date   INR 2.0 (H) 04/12/2019   INR >10.0 (HH) 04/11/2019   INR >10.0 (HH) 04/12/2019     Lab Results  Component Value Date   PLT 321 04/17/2019    Inpatient Medications  Scheduled Meds: . vitamin C  500 mg Oral Daily  . dexamethasone (DECADRON) injection  10 mg Intravenous Q24H  . enoxaparin (LOVENOX) injection  1 mg/kg Subcutaneous Q24H  . ezetimibe  10 mg Oral QPM  . flecainide  50 mg Oral BID  . folic acid  1 mg Oral Daily  . furosemide  80 mg Intravenous BID  . Ipratropium-Albuterol  1 puff Inhalation Q6H  . multivitamin with minerals  1 tablet Oral Daily  . pantoprazole  40 mg Oral BID  . QUEtiapine  25 mg Oral BID  . tamoxifen  20 mg Oral Daily  . thiamine  100 mg Oral Daily  . cyanocobalamin  100 mcg Oral Daily  . zinc sulfate  220 mg Oral Daily   Continuous Infusions:  PRN Meds:.ALPRAZolam, bisacodyl, fluticasone, ondansetron (ZOFRAN) IV, sodium chloride  Antibiotics  :    Anti-infectives (From admission, onward)   Start     Dose/Rate Route Frequency  Ordered Stop   04/12/19 1000  remdesivir 100 mg in sodium chloride 0.9 % 100 mL IVPB  Status:  Discontinued     100 mg 200 mL/hr over 30 Minutes Intravenous Daily 04/11/19 0433 04/11/19 0446   04/12/19 1000  remdesivir 100 mg in sodium chloride 0.9 % 100 mL IVPB     100 mg 200 mL/hr over 30 Minutes Intravenous Daily 04/11/19 0447 04/15/19 1936   04/12/19 0800  cephALEXin (KEFLEX) capsule 250 mg     250 mg Oral Daily 04/12/19 0753 04/16/19 0802   04/12/19 0745  cephALEXin (KEFLEX) capsule 500 mg  Status:  Discontinued     500 mg Oral Every 12 hours 04/12/19 0736 04/12/19 0751   04/11/19 1000  remdesivir 100 mg in sodium chloride 0.9 % 100 mL IVPB  Status:  Discontinued     100 mg 200 mL/hr over 30 Minutes Intravenous Daily 05/01/2019 2234 04/11/19 0447   04/11/19 0500  azithromycin (ZITHROMAX) 500 mg in sodium chloride 0.9 % 250 mL IVPB  Status:  Discontinued     500 mg 250 mL/hr over 60 Minutes Intravenous Daily 04/11/19 0433 04/11/19 0734   04/11/19 0500  cefTRIAXone (ROCEPHIN) 1 g in sodium chloride 0.9 % 100 mL IVPB  Status:  Discontinued     1 g 200 mL/hr over 30 Minutes Intravenous Daily 04/11/19 0433 04/11/19 0734   04/11/19 0445  remdesivir 200 mg in sodium chloride 0.9% 250 mL IVPB  Status:  Discontinued     200 mg 580 mL/hr over 30 Minutes Intravenous Once 04/11/19 0433 04/11/19 0551   04/26/2019 2245  remdesivir 200 mg in sodium chloride 0.9% 250 mL IVPB     200 mg 580 mL/hr over 30 Minutes Intravenous Once 04/30/2019 2234 04/11/19 0618       Time Spent in minutes  56   Emeline Gins Annamarie Yamaguchi M.D on 04/17/2019 at 2:58 PM  To page go to [email or web address] - password [facility]  [facility] -  Office  [phone]    See all Orders from today for further details    Objective:   Vitals:   04/17/19 0859 04/17/19 1149 04/17/19 1335 04/17/19 1341  BP: 111/62 112/64    Pulse: (!) 108 (!) 105    Resp: (!) 25 (!) 27    Temp:  98.3 F (36.8 C)    TempSrc:  Axillary    SpO2: (!) 87%  97% (!) 81% 91%  Weight:      Height:        Wt Readings from  Last 3 Encounters:  04/09/2019 73.5 kg  03/29/19 73.5 kg  02/04/19 75.1 kg     Intake/Output Summary (Last 24 hours) at 04/17/2019 1458 Last data filed at 04/17/2019 0600 Gross per 24 hour  Intake 400 ml  Output 1200 ml  Net -800 ml     Physical Exam  Awake, seemingly frail, chronically ill-appearing, laying in bed in no apparent distress  Symmetrical chest wall movement, diminished air entry bilaterally, Rales present . Regular rate and rhythm, no rubs or gallops . +ve B.Sounds, Abd Soft, No tenderness, No organomegaly appriciated, No rebound - guarding or rigidity. No Cyanosis, Clubbing or edema, No new Rash or bruise    Data Review:    CBC Recent Labs  Lab 04/12/19 0115 04/13/19 0404 04/14/19 0420 04/15/19 0547 04/16/19 0840 04/17/19 0355  WBC 10.7* 11.5* 8.8 9.8 8.6 8.2  HGB 11.2* 12.8 13.6 13.4 13.7 14.1  HCT 33.5* 37.5 40.9 40.8 42.0 43.6  PLT 413* 301 295 279 300 321  MCV 90.3 89.1 90.9 92.7 92.5 92.8  MCH 30.2 30.4 30.2 30.5 30.2 30.0  MCHC 33.4 34.1 33.3 32.8 32.6 32.3  RDW 13.9 14.0 14.6 14.9 14.7 14.9  LYMPHSABS 1.1 1.1 1.4 1.2  --  1.3  MONOABS 0.5 0.8 0.4 0.4  --  0.3  EOSABS 0.0 0.0 0.1 0.0  --  0.1  BASOSABS 0.0 0.0 0.0 0.0  --  0.0    Chemistries  Recent Labs  Lab 04/12/19 0115 04/13/19 0404 04/14/19 0420 04/15/19 0547 04/16/19 0840 04/17/19 0355  NA 132* 135 137 142 143 145  K 4.8 4.8 4.6 4.4 3.8 3.9  CL 97* 98 96* 99 98 95*  CO2 20* _0 33*  GLUCOSE 123* 124* 104* 153* 147* 165*  BUN 75* 89* 97* 104* 99* 108*  CREATININE 3.53* 3.05* 2.77* 2.54* 2.21* 2.08*  CALCIUM 8.3* 8.4* 8.7* 8.8* 9.0 9.1  MG 2.1 2.1 2.0 2.2 2.0 2.1  AST 96* 50* 44* 46*  --  35  ALT 57* 48* 39 36  --  26  ALKPHOS 91 101 111 89  --  83  BILITOT 0.7 0.7 1.1 0.9  --  1.7*    ------------------------------------------------------------------------------------------------------------------ No results for input(s): CHOL, HDL, LDLCALC, TRIG, CHOLHDL, LDLDIRECT in the last 72 hours.  Lab Results  Component Value Date   HGBA1C 5.5 03/23/2018   ------------------------------------------------------------------------------------------------------------------ No results for input(s): TSH, T4TOTAL, T3FREE, THYROIDAB in the last 72 hours.  Invalid input(s): FREET3  Cardiac Enzymes No results for input(s): CKMB, TROPONINI, MYOGLOBIN in the last 168 hours.  Invalid input(s): CK ------------------------------------------------------------------------------------------------------------------    Component Value Date/Time   BNP 70.9 04/17/2019 0355    Micro Results Recent Results (from the past 240 hour(s))  Blood Culture (routine x 2)     Status: None   Collection Time: 05/05/2019  5:13 PM   Specimen: BLOOD RIGHT WRIST  Result Value Ref Range Status   Specimen Description BLOOD RIGHT WRIST  Final   Special Requests   Final    BOTTLES DRAWN AEROBIC AND ANAEROBIC Blood Culture results may not be optimal due to an inadequate volume of blood received in culture bottles   Culture   Final    NO GROWTH 5 DAYS Performed at [facility], [place] [address], [place], [place] [address]    Report Status 04/15/2019 FINAL  Final  Blood Culture (routine x 2)     Status: None   Collection Time:   5:13 PM  Specimen: BLOOD RIGHT ARM  Result Value Ref Range Status   Specimen Description BLOOD RIGHT ARM  Final   Special Requests AEROBIC BOTTLE ONLY Blood Culture adequate volume  Final   Culture   Final    NO GROWTH 5 DAYS Performed at [facility], [address] [place], [place] [address]    Report Status 04/15/2019 FINAL  Final  SARS CORONAVIRUS 2 (TAT 6-24 HRS) Nasopharyngeal Nasopharyngeal Swab     Status: Abnormal   Collection Time: 04/26/2019  7:04  PM   Specimen: Nasopharyngeal Swab  Result Value Ref Range Status   SARS Coronavirus 2 POSITIVE (A) NEGATIVE Final    Comment: RESULT CALLED TO, READ BACK BY AND VERIFIED WITH: B.BECK,RN 0046 04/11/19 G.MCADOO (NOTE) SARS-CoV-2 target nucleic acids are DETECTED. The SARS-CoV-2 RNA is generally detectable in upper and lower respiratory specimens during the acute phase of infection. Positive results are indicative of the presence of SARS-CoV-2 RNA. Clinical correlation with patient history and other diagnostic information is  necessary to determine patient infection status. Positive results do not rule out bacterial infection or co-infection with other viruses.  The expected result is Negative. Fact Sheet for Patients: SugarRoll.be Fact Sheet for Healthcare Providers: https://www.woods-mathews.com/ This test is not yet approved or cleared by the [place] FDA and  has been authorized for detection and/or diagnosis of SARS-CoV-2 by FDA under an Emergency Use Authorization (EUA). This EUA will remain  in effect (meaning this test can be used) for the dura tion of the COVID-19 declaration under Section 564(b)(1) of the Act, 21 U.S.C. section 360bbb-3(b)(1), unless the authorization is terminated or revoked sooner. Performed at [facility], [place] [address] [place], [place] [address]     Radiology Reports CT Head Wo Contrast  Result Date: 03/29/2019 CLINICAL DATA:  Poly trauma, fell this evening striking head on floor; past history of breast cancer, hypertension, diabetes mellitus, paroxysmal atrial fibrillation EXAM: CT HEAD WITHOUT CONTRAST CT CERVICAL SPINE WITHOUT CONTRAST TECHNIQUE: Multidetector CT imaging of the head and cervical spine was performed following the standard protocol without intravenous contrast. Multiplanar CT image reconstructions of the cervical spine were also generated. COMPARISON:  CT head 03/09/2018, MRI cervical  spine 09/10/2007 FINDINGS: CT HEAD FINDINGS Brain: Generalized atrophy. Normal ventricular morphology. No midline shift or mass effect. Mild small vessel chronic ischemic changes of deep cerebral white matter. Streak artifacts at temporal lobes due to motion. Low-attenuation at LEFT occipital lobe, by sagittal images likely related to same artifacts. No intracranial hemorrhage, mass lesion or definite acute infarction. No extra-axial fluid collections. Vascular: Atherosclerotic calcifications of internal carotid arteries at skull base. Skull: Calvaria intact.  Scattered motion artifacts noted. Sinuses/Orbits: Clear Other: N/A CT CERVICAL SPINE FINDINGS Alignment: Anterolisthesis at C3-C4, unchanged. Remaining alignments normal. Skull base and vertebrae: Osseous demineralization. Skull base intact. Multilevel facet degenerative changes. Multilevel degenerative disc disease changes. Vertebral body heights maintained without fracture or bone destruction. Ankylosis of C4-C5 facet joints. Encroachment upon C3-C4 and C5-C6 neural foramina bilaterally as well as RIGHT C6-C7. Soft tissues and spinal canal: Prevertebral soft tissues normal thickness. Atherosclerotic calcifications within the carotid systems bilaterally. BILATERAL carotid bifurcations extend retropharyngeal. Additional atherosclerotic calcifications at aortic arch. Disc levels:  No additional abnormalities Upper chest: Scarring at RIGHT apex. Other: N/A IMPRESSION: Atrophy with small vessel chronic ischemic changes of deep cerebral white matter. No acute intracranial abnormalities identified on exam limited by mild patient motion artifacts. Degenerative disc and facet disease changes of the cervical spine.  No acute cervical spine abnormalities. Electronically Signed   By: Lavonia Dana M.D.   On: 03/29/2019 21:05   CT Cervical Spine Wo Contrast  Result Date: 03/29/2019 CLINICAL DATA:  Poly trauma, fell this evening striking head on floor; past history of  breast cancer, hypertension, diabetes mellitus, paroxysmal atrial fibrillation EXAM: CT HEAD WITHOUT CONTRAST CT CERVICAL SPINE WITHOUT CONTRAST TECHNIQUE: Multidetector CT imaging of the head and cervical spine was performed following the standard protocol without intravenous contrast. Multiplanar CT image reconstructions of the cervical spine were also generated. COMPARISON:  CT head 03/09/2018, MRI cervical spine 09/10/2007 FINDINGS: CT HEAD FINDINGS Brain: Generalized atrophy. Normal ventricular morphology. No midline shift or mass effect. Mild small vessel chronic ischemic changes of deep cerebral white matter. Streak artifacts at temporal lobes due to motion. Low-attenuation at LEFT occipital lobe, by sagittal images likely related to same artifacts. No intracranial hemorrhage, mass lesion or definite acute infarction. No extra-axial fluid collections. Vascular: Atherosclerotic calcifications of internal carotid arteries at skull base. Skull: Calvaria intact.  Scattered motion artifacts noted. Sinuses/Orbits: Clear Other: N/A CT CERVICAL SPINE FINDINGS Alignment: Anterolisthesis at C3-C4, unchanged. Remaining alignments normal. Skull base and vertebrae: Osseous demineralization. Skull base intact. Multilevel facet degenerative changes. Multilevel degenerative disc disease changes. Vertebral body heights maintained without fracture or bone destruction. Ankylosis of C4-C5 facet joints. Encroachment upon C3-C4 and C5-C6 neural foramina bilaterally as well as RIGHT C6-C7. Soft tissues and spinal canal: Prevertebral soft tissues normal thickness. Atherosclerotic calcifications within the carotid systems bilaterally. BILATERAL carotid bifurcations extend retropharyngeal. Additional atherosclerotic calcifications at aortic arch. Disc levels:  No additional abnormalities Upper chest: Scarring at RIGHT apex. Other: N/A IMPRESSION: Atrophy with small vessel chronic ischemic changes of deep cerebral white matter. No  acute intracranial abnormalities identified on exam limited by mild patient motion artifacts. Degenerative disc and facet disease changes of the cervical spine. No acute cervical spine abnormalities. Electronically Signed   By: Lavonia Dana M.D.   On: 03/29/2019 21:05   US Renal  Result Date: 04/21/2019 CLINICAL DATA:  Acute kidney injury EXAM: RENAL / URINARY TRACT ULTRASOUND COMPLETE COMPARISON:  None. FINDINGS: Right Kidney: Renal measurements: 7.9 x 4.2 x 3.9 cm = volume: 65.8 mL. There is increased cortical echogenicity. There is no hydronephrosis. Left Kidney: Renal measurements: 8.7 x 4.7 x 3.6 cm = volume: 77 mL. There is increased cortical echogenicity. There is no hydronephrosis. Bladder: Appears normal for degree of bladder distention. Other: None. IMPRESSION: 1. No acute abnormality. 2. Echogenic kidneys bilaterally which can be seen in patients with medical renal disease. Electronically Signed   By: Constance Holster M.D.   On: 04/07/2019 23:41   DG Chest Port 1 View  Result Date: 04/16/2019 CLINICAL DATA:  Shortness of breath COVID-19 pneumonia. EXAM: PORTABLE CHEST 1 VIEW COMPARISON:  One-view chest x-ray 04/14/2019 FINDINGS: Heart size is stable. Atherosclerotic calcifications present at the aortic arch. Progressive interstitial and airspace opacities are present in both lungs, consistent with worsening pneumonia. There is new gaseous distention of the stomach. IMPRESSION: 1. Progressive interstitial and airspace disease in both lungs compatible with worsening pneumonia. 2. New gaseous distention of the stomach. 3. Aortic atherosclerosis. Electronically Signed   By: San Morelle M.D.   On: 04/16/2019 08:45   DG CHEST PORT 1 VIEW  Result Date: 04/15/2019 CLINICAL DATA:  Oxygen desaturation. ARDS. COVID-19 virus infection. EXAM: PORTABLE CHEST 1 VIEW COMPARISON:  04/14/2019 FINDINGS: Heart size is stable. Diffuse mixed interstitial and airspace opacity throughout both lungs shows  no  significant change. No pneumothorax or pleural effusion visualized. IMPRESSION: No significant change in diffuse mixed interstitial and airspace opacity throughout both lungs. Electronically Signed   By: Marlaine Hind M.D.   On: 04/15/2019 08:06   DG Chest Port 1 View  Result Date: 04/14/2019 CLINICAL DATA:  COVID-19 positive, dyspnea EXAM: PORTABLE CHEST 1 VIEW COMPARISON:  04/14/2019 chest radiograph. FINDINGS: Stable cardiomediastinal silhouette with normal heart size. No pneumothorax. No pleural effusion. Extensive patchy opacities throughout both lungs appear worsened. IMPRESSION: Worsening extensive patchy opacities throughout both lungs, compatible with COVID-19 pneumonia. Electronically Signed   By: Ilona Sorrel M.D.   On: 04/14/2019 11:44   DG Chest Portable 1 View  Result Date: 04/29/2019 CLINICAL DATA:  Hypoxia. EXAM: PORTABLE CHEST 1 VIEW COMPARISON:  01/01/2019 FINDINGS: There are diffuse bilateral hazy ground-glass airspace opacities, greatest within the left lung zone. The heart size is borderline enlarged. Aortic calcifications are noted. There is no pneumothorax or large pleural effusion. There is no acute osseous abnormality. IMPRESSION: Diffuse bilateral ground-glass airspace opacities consistent with the patient's history of viral pneumonia. Electronically Signed   By: Constance Holster M.D.   On: 04/15/2019 16:26

## 2019-04-17 NOTE — Evaluation (Signed)
Clinical/Bedside Swallow Evaluation Patient Details  Name: Willis Kuipers MRN: [id number] Date of Birth: Oct 08, 1934  Today's Date: 04/17/2019 Time: SLP Start Time (ACUTE ONLY): 12 SLP Stop Time (ACUTE ONLY): 1806 SLP Time Calculation (min) (ACUTE ONLY): 26 min  Past Medical History:  Past Medical History:  Diagnosis Date  . Anxiety   . Arthritis    a. R>L knees  . Cancer [facility])    Left Breast Cancer  . Diabetes mellitus   . Dysrhythmia   . Environmental allergies   . Hiatal hernia   . Hyperlipidemia    a. statin intolerant - myalgias  . Hypertension   . Paroxysmal atrial fibrillation ([place])    a. Dx in 2004.  Off coumadin x several yrs;  b. Normal Event Monitor 03/2011.   Past Surgical History:  Past Surgical History:  Procedure Laterality Date  . ABDOMINAL HYSTERECTOMY    . BREAST LUMPECTOMY WITH RADIOACTIVE SEED LOCALIZATION Left 01/17/2019   Procedure: LEFT BREAST LUMPECTOMY WITH RADIOACTIVE SEED LOCALIZATION;  Surgeon: Alphonsa Overall, MD;  Location: [facility];  Service: General;  Laterality: Left;  . CHOLECYSTECTOMY    . CYSTECTOMY     lower back  . lipoma tumor removed     right shoulder  . ORIF ANKLE FRACTURE Right 03/23/2018   Procedure: OPEN REDUCTION INTERNAL FIXATION (ORIF) ANKLE FRACTURE;  Surgeon: Nicholes Stairs, MD;  Location: [facility];  Service: Orthopedics;  Laterality: Right;   HPI:  85 y/o female w/ hx of paroxysmal a-fib, HTN, HLD, dysrhythmia, DM, cancer, arthritis, anxiety. Pt had fall and hit head on 12/25 tested + COVID 3 weeks prior (to 1/6) was managing at home but had steady decline in function with coughing, congestion, decreased apetite and ambulation. Admitted with Pneumonia due to severe acute respiratory syndrome coronavirus 2 (SARS-CoV-2).  On 1/13 notes indicate that pt c/o difficulty swallowing with subsequent desaturation to 80%.    Assessment / Plan / Recommendation Clinical Impression  Pt presents with a quite functional swallow.   Despite high oxygen needs - she is on 35L heated HFNC, 100% Fi02- her RR is within ranges compatible with adequate swallow/breathing reciprocity, and Sp02 is >90%.  Pt repositioned in bed to optimize safe eating, and swallow assessment was completed during dinner meal. Pt able to feed herself.  She had limited appetite, but consumed small amounts of regular solids, purees, and quite a bit of thin liquid. There was adequate mastication, brisk swallow response, and no s/s of aspiration; no c/o difficulty.  D/W RN - recommend continuing current diet; crush meds given today's difficulty with pills.  SLP will sign off - please reconsult [place] if difficulty recurs.  SLP Visit Diagnosis: Dysphagia, unspecified (R13.10)    Aspiration Risk  No limitations    Diet Recommendation   regular, thin liquids  Medication Administration: Crushed with puree    Other  Recommendations Oral Care Recommendations: Oral care BID   Follow up Recommendations None        Swallow Study   General HPI: 85 y/o female w/ hx of paroxysmal a-fib, HTN, HLD, dysrhythmia, DM, cancer, arthritis, anxiety. Pt had fall and hit head on 12/25 tested + COVID 3 weeks prior (to 1/6) was managing at home but had steady decline in function with coughing, congestion, decreased apetite and ambulation. Admitted with Pneumonia due to severe acute respiratory syndrome coronavirus 2 (SARS-CoV-2).  On 1/13 notes indicate that pt c/o difficulty swallowing with subsequent desaturation to 80%.  Type of Study: Bedside Swallow Evaluation  Previous Swallow Assessment: no Diet Prior to this Study: Regular;Thin liquids Temperature Spikes Noted: No Respiratory Status: Other (comment)(heated HFNC) History of Recent Intubation: No Behavior/Cognition: Alert;Cooperative Oral Cavity Assessment: Within Functional Limits Oral Cavity - Dentition: Dentures, top;Dentures, bottom Vision: Functional for self-feeding Self-Feeding Abilities: Able to feed self Patient  Positioning: Upright in bed Baseline Vocal Quality: Normal Volitional Cough: Strong Volitional Swallow: Able to elicit    Oral/Motor/Sensory Function Overall Oral Motor/Sensory Function: Within functional limits   Ice Chips Ice chips: Within functional limits   Thin Liquid Thin Liquid: Within functional limits    Nectar Thick Nectar Thick Liquid: Not tested   Honey Thick Honey Thick Liquid: Not tested   Puree Puree: Within functional limits   Solid    Jusiah Aguayo L. Tivis Ringer, MA CCC/SLP Acute Rehabilitation Services Office number [phone]   Solid: Within functional limits      Juan Quam Laurice 04/17/2019,6:13 PM

## 2019-04-18 LAB — CBC WITH DIFFERENTIAL/PLATELET
Abs Immature Granulocytes: 0.07 10*3/uL (ref 0.00–0.07)
Basophils Absolute: 0 10*3/uL (ref 0.0–0.1)
Basophils Relative: 0 %
Eosinophils Absolute: 0 10*3/uL (ref 0.0–0.5)
Eosinophils Relative: 0 %
HCT: 43.8 % (ref 36.0–46.0)
Hemoglobin: 14 g/dL (ref 12.0–15.0)
Immature Granulocytes: 1 %
Lymphocytes Relative: 19 %
Lymphs Abs: 1.6 10*3/uL (ref 0.7–4.0)
MCH: 30 pg (ref 26.0–34.0)
MCHC: 32 g/dL (ref 30.0–36.0)
MCV: 94 fL (ref 80.0–100.0)
Monocytes Absolute: 0.6 10*3/uL (ref 0.1–1.0)
Monocytes Relative: 6 %
Neutro Abs: 6.5 10*3/uL (ref 1.7–7.7)
Neutrophils Relative %: 74 %
Platelets: 345 10*3/uL (ref 150–400)
RBC: 4.66 MIL/uL (ref 3.87–5.11)
RDW: 14.7 % (ref 11.5–15.5)
WBC: 8.8 10*3/uL (ref 4.0–10.5)
nRBC: 0.2 % (ref 0.0–0.2)

## 2019-04-18 LAB — COMPREHENSIVE METABOLIC PANEL
ALT: 26 U/L (ref 0–44)
AST: 34 U/L (ref 15–41)
Albumin: 3.3 g/dL — ABNORMAL LOW (ref 3.5–5.0)
Alkaline Phosphatase: 80 U/L (ref 38–126)
Anion gap: 16 — ABNORMAL HIGH (ref 5–15)
BUN: 96 mg/dL — ABNORMAL HIGH (ref 8–23)
CO2: 35 mmol/L — ABNORMAL HIGH (ref 22–32)
Calcium: 9.4 mg/dL (ref 8.9–10.3)
Chloride: 94 mmol/L — ABNORMAL LOW (ref 98–111)
Creatinine, Ser: 2.14 mg/dL — ABNORMAL HIGH (ref 0.44–1.00)
GFR calc Af Amer: 24 mL/min — ABNORMAL LOW (ref >60)
GFR calc non Af Amer: 21 mL/min — ABNORMAL LOW (ref >60)
Glucose, Bld: 182 mg/dL — ABNORMAL HIGH (ref 70–99)
Potassium: 3.4 mmol/L — ABNORMAL LOW (ref 3.5–5.1)
Sodium: 145 mmol/L (ref 135–145)
Total Bilirubin: 2 mg/dL — ABNORMAL HIGH (ref 0.3–1.2)
Total Protein: 6.5 g/dL (ref 6.5–8.1)

## 2019-04-18 LAB — MAGNESIUM: Magnesium: 2.3 mg/dL (ref 1.7–2.4)

## 2019-04-18 LAB — D-DIMER, QUANTITATIVE: D-Dimer, Quant: 7.83 ug/mL-FEU — ABNORMAL HIGH (ref 0.00–0.50)

## 2019-04-18 LAB — BRAIN NATRIURETIC PEPTIDE: B Natriuretic Peptide: 68.2 pg/mL (ref 0.0–100.0)

## 2019-04-18 LAB — C-REACTIVE PROTEIN: CRP: 0.7 mg/dL (ref <1.0)

## 2019-04-18 MED ORDER — GLUCERNA SHAKE PO LIQD
237.0000 mL | Freq: Three times a day (TID) | ORAL | Status: DC
  Administered 2019-04-18 – 2019-04-19 (×2): 237 mL via ORAL

## 2019-04-18 MED ORDER — POTASSIUM CHLORIDE CRYS ER 20 MEQ PO TBCR
40.0000 meq | EXTENDED_RELEASE_TABLET | Freq: Once | ORAL | Status: AC
  Administered 2019-04-18: 40 meq via ORAL
  Filled 2019-04-18: qty 2

## 2019-04-18 NOTE — Progress Notes (Signed)
PROGRESS NOTE                                                                                                                                                                                                             Patient Demographics:    Emily Andrews, is a 85 y.o. female, DOB - 08/06/1934, IRW:[id number]  Outpatient Primary MD for the patient is Orpah Melter, MD    LOS - 8  Admit date - 04/27/2019    Chief Complaint  Patient presents with  . Shortness of Breath    Covid+       Brief Narrative   85 year old Caucasian female with past medical history significant for proximal atrial fibrillation, [place] vas 2 score of at least 3 on Coumadin, hypertension, hyperlipidemia and diabetes mellitus.  Apparently, patient tested positive for COVID-19 virus infection about 3 weeks ago, she presented with shortness of breath was diagnosed with acute hypoxic respiratory failure due to COVID-19 pneumonia and admitted to hospital.   Subjective:   Patient in bed, appears fatigued, denies any headache, no fever, no chest pain or pressure, she did shock earlier this morning when she took her meds  Assessment  & Plan :   Acute Hypoxic Resp. Failure due to Acute Covid 19 Viral Pneumonitis during the ongoing 2020 Covid 19 Pandemic  - she has severe disease, significant oxygen requirement, she did require heated high flow 40 L / 100%, this has been improving, this morning she is tolerating 15 L high flow nasal cannula. - Continue with  on IV steroids, - Continue with remdesivir X2, 1/7 and 1/11  -Patient with  volume overload, diurese as needed. - Patient is extremely frail, tenuous, and extremely at high risk for decompensation -Patient remains very tenuous, risk for decompensation, she remains DNR,    SpO2: 99 % O2 Flow Rate (L/min): 15 L/min FiO2 (%): 100 %   Recent Labs  Lab 04/12/19 0115 04/12/19 0115 04/13/19 0404  04/13/19 0404 04/14/19 0420 04/15/19 0547 04/16/19 0800 04/16/19 0840 04/17/19 0355 04/18/19 0341  CRP 13.2*   < > 7.7*   < > 4.4* 2.6*  --  1.4* 0.9 0.7  DDIMER 2.21*   < > >20.00*   < > 17.22* 13.53*  --  10.94* 9.36* 7.83*  BNP 392.1*   < > 356.5*   < >  162.4* 232.6*  --  102.8* 70.9 68.2  PROCALCITON 0.53  --  0.39  --  0.34 0.28 <0.10  --   --   --    < > = values in this interval not displayed.    Hepatic Function Latest Ref Rng & Units 04/18/2019 04/17/2019 04/15/2019  Total Protein 6.5 - 8.1 g/dL 6.5 6.2(L) 5.9(L)  Albumin 3.5 - 5.0 g/dL 3.3(L) 2.9(L) 2.4(L)  AST 15 - 41 U/L 34 35 46(H)  ALT 0 - 44 U/L 26 26 36  Alk Phosphatase 38 - 126 U/L 80 83 89  Total Bilirubin 0.3 - 1.2 mg/dL 2.0(H) 1.7(H) 0.9  Bilirubin, Direct 0.0 - 0.3 mg/dL - - -     Acute on chronic diastolic CHF EF 09% on echocardiogram done in 2019 - continue IV Lasix and Zaroxolyn.  Continue to monitor.  Hypokalemia -Repleted, monitor closely as she is on IV diuresis.   AKI  -could be ATN due to recent infection, was also on ARB.  - Renal ultrasound nonacute but does show chronic kidney disease.   -Baseline creatinine 1.3, peaked at 3.6, improving, monitor closely as she is on IV diuresis .  Paroxysmal atrial fibrillation.   - [place] vas 2 score was greater than 3, she was on Coumadin with erratic INR control, when she was admitted her INR was greater than 10, Coumadin has been reversed with vitamin K IV, INR stable will be transitioned to Lovenox.  UTI. Finished Keflex for a total of 3 days.  Spike in D-dimer.  This happened after her vitamin K was reversed, question if she has thrown a clot, although she is on Eliquis will challenge her with a few days of Lovenox starting on 04/14/2019, she has also become quite short of breath after improvement on 04/14/2019.  Will monitor closely.    Condition - Guarded +++  Family Communication  : Discussed with daughter via phone  Code Status :  DNR  Diet :    Diet Order            Diet heart healthy/carb modified Room service appropriate? Yes; Fluid consistency: Thin  Diet effective now               Disposition Plan  :  TBD  Consults  :  None  Procedures  :    Renal US - non acute  PUD Prophylaxis : PPI  DVT Prophylaxis  : Coumadin/Eliquis/Lovenox  Lab Results  Component Value Date   INR 2.0 (H) 04/12/2019   INR >10.0 (HH) 04/11/2019   INR >10.0 (HH) 04/23/2019     Lab Results  Component Value Date   PLT 345 04/18/2019    Inpatient Medications  Scheduled Meds: . vitamin C  500 mg Oral Daily  . dexamethasone (DECADRON) injection  10 mg Intravenous Q24H  . enoxaparin (LOVENOX) injection  1 mg/kg Subcutaneous Q24H  . ezetimibe  10 mg Oral QPM  . feeding supplement (GLUCERNA SHAKE)  237 mL Oral TID BM  . flecainide  50 mg Oral BID  . folic acid  1 mg Oral Daily  . furosemide  80 mg Intravenous BID  . Ipratropium-Albuterol  1 puff Inhalation Q6H  . multivitamin with minerals  1 tablet Oral Daily  . pantoprazole  40 mg Oral BID  . QUEtiapine  25 mg Oral BID  . tamoxifen  20 mg Oral Daily  . thiamine  100 mg Oral Daily  . cyanocobalamin  100 mcg Oral Daily  .  zinc sulfate  220 mg Oral Daily   Continuous Infusions:  PRN Meds:.ALPRAZolam, bisacodyl, fluticasone, ondansetron (ZOFRAN) IV, sodium chloride  Antibiotics  :    Anti-infectives (From admission, onward)   Start     Dose/Rate Route Frequency Ordered Stop   04/12/19 1000  remdesivir 100 mg in sodium chloride 0.9 % 100 mL IVPB  Status:  Discontinued     100 mg 200 mL/hr over 30 Minutes Intravenous Daily 04/11/19 0433 04/11/19 0446   04/12/19 1000  remdesivir 100 mg in sodium chloride 0.9 % 100 mL IVPB     100 mg 200 mL/hr over 30 Minutes Intravenous Daily 04/11/19 0447 04/15/19 1936   04/12/19 0800  cephALEXin (KEFLEX) capsule 250 mg     250 mg Oral Daily 04/12/19 0753 04/16/19 0802   04/12/19 0745  cephALEXin (KEFLEX) capsule 500 mg  Status:   Discontinued     500 mg Oral Every 12 hours 04/12/19 0736 04/12/19 0751   04/11/19 1000  remdesivir 100 mg in sodium chloride 0.9 % 100 mL IVPB  Status:  Discontinued     100 mg 200 mL/hr over 30 Minutes Intravenous Daily 04/15/2019 2234 04/11/19 0447   04/11/19 0500  azithromycin (ZITHROMAX) 500 mg in sodium chloride 0.9 % 250 mL IVPB  Status:  Discontinued     500 mg 250 mL/hr over 60 Minutes Intravenous Daily 04/11/19 0433 04/11/19 0734   04/11/19 0500  cefTRIAXone (ROCEPHIN) 1 g in sodium chloride 0.9 % 100 mL IVPB  Status:  Discontinued     1 g 200 mL/hr over 30 Minutes Intravenous Daily 04/11/19 0433 04/11/19 0734   04/11/19 0445  remdesivir 200 mg in sodium chloride 0.9% 250 mL IVPB  Status:  Discontinued     200 mg 580 mL/hr over 30 Minutes Intravenous Once 04/11/19 0433 04/11/19 0551   04/17/2019 2245  remdesivir 200 mg in sodium chloride 0.9% 250 mL IVPB     200 mg 580 mL/hr over 30 Minutes Intravenous Once 04/09/2019 2234 04/11/19 0618       Time Spent in minutes  53   Emeline Gins Tasmia Blumer M.D on 04/18/2019 at 2:41 PM  To page go to [email or web address] - password [facility]  [facility] -  Office  [phone]    See all Orders from today for further details    Objective:   Vitals:   04/18/19 0458 04/18/19 0802 04/18/19 1028 04/18/19 1122  BP:  114/60  123/65  Pulse: 98 98 (!) 114 (!) 110  Resp: (!) 27 (!) 27 (!) 32 (!) 22  Temp:  98.3 F (36.8 C)  97.6 F (36.4 C)  TempSrc:  Oral  Axillary  SpO2: 92% 98% (!) 89% 99%  Weight:      Height:        Wt Readings from Last 3 Encounters:  04/16/2019 73.5 kg  03/29/19 73.5 kg  02/04/19 75.1 kg     Intake/Output Summary (Last 24 hours) at 04/18/2019 1441 Last data filed at 04/18/2019 1122 Gross per 24 hour  Intake 180 ml  Output 1000 ml  Net -820 ml     Physical Exam  Wake, extremely frail, he appears to be more conversant, appropriate and following commands today . Symmetrical Chest wall movement, diminished air  entry bilaterally with scattered rhonchi RRR,No Gallops,Rubs or new Murmurs, No Parasternal Heave +ve B.Sounds, Abd Soft, No tenderness, No rebound - guarding or rigidity. No Cyanosis, Clubbing or edema, No new Rash or bruise       Data  Review:    CBC Recent Labs  Lab 04/13/19 0404 04/13/19 0404 04/14/19 0420 04/15/19 0547 04/16/19 0840 04/17/19 0355 04/18/19 0341  WBC 11.5*   < > 8.8 9.8 8.6 8.2 8.8  HGB 12.8   < > 13.6 13.4 13.7 14.1 14.0  HCT 37.5   < > 40.9 40.8 42.0 43.6 43.8  PLT 301   < > 295 279 300 321 345  MCV 89.1   < > 90.9 92.7 92.5 92.8 94.0  MCH 30.4   < > 30.2 30.5 30.2 30.0 30.0  MCHC 34.1   < > 33.3 32.8 32.6 32.3 32.0  RDW 14.0   < > 14.6 14.9 14.7 14.9 14.7  LYMPHSABS 1.1  --  1.4 1.2  --  1.3 1.6  MONOABS 0.8  --  0.4 0.4  --  0.3 0.6  EOSABS 0.0  --  0.1 0.0  --  0.1 0.0  BASOSABS 0.0  --  0.0 0.0  --  0.0 0.0   < > = values in this interval not displayed.    Chemistries  Recent Labs  Lab 04/13/19 0404 04/13/19 0404 04/14/19 0420 04/15/19 0547 04/16/19 0840 04/17/19 0355 04/18/19 0341  NA 135   < > 137 142 143 145 145  K 4.8   < > 4.6 4.4 3.8 3.9 3.4*  CL 98   < > 96* 99 98 95* 94*  CO2 23   < > '27 29 29 ' 33* 35*  GLUCOSE 124*   < > 104* 153* 147* 165* 182*  BUN 89*   < > 97* 104* 99* 108* 96*  CREATININE 3.05*   < > 2.77* 2.54* 2.21* 2.08* 2.14*  CALCIUM 8.4*   < > 8.7* 8.8* 9.0 9.1 9.4  MG 2.1   < > 2.0 2.2 2.0 2.1 2.3  AST 50*  --  44* 46*  --  35 34  ALT 48*  --  39 36  --  26 26  ALKPHOS 101  --  111 89  --  83 80  BILITOT 0.7  --  1.1 0.9  --  1.7* 2.0*   < > = values in this interval not displayed.   ------------------------------------------------------------------------------------------------------------------ No results for input(s): CHOL, HDL, LDLCALC, TRIG, CHOLHDL, LDLDIRECT in the last 72 hours.  Lab Results  Component Value Date   HGBA1C 5.5 03/23/2018    ------------------------------------------------------------------------------------------------------------------ No results for input(s): TSH, T4TOTAL, T3FREE, THYROIDAB in the last 72 hours.  Invalid input(s): FREET3  Cardiac Enzymes No results for input(s): CKMB, TROPONINI, MYOGLOBIN in the last 168 hours.  Invalid input(s): CK ------------------------------------------------------------------------------------------------------------------    Component Value Date/Time   BNP 68.2 04/18/2019 0341    Micro Results Recent Results (from the past 240 hour(s))  Blood Culture (routine x 2)     Status: None   Collection Time: 04/12/2019  5:13 PM   Specimen: BLOOD RIGHT WRIST  Result Value Ref Range Status   Specimen Description BLOOD RIGHT WRIST  Final   Special Requests   Final    BOTTLES DRAWN AEROBIC AND ANAEROBIC Blood Culture results may not be optimal due to an inadequate volume of blood received in culture bottles   Culture   Final    NO GROWTH 5 DAYS Performed at [facility], [place] [address], [place], [place] [address]    Report Status 04/15/2019 FINAL  Final  Blood Culture (routine x 2)     Status: None   Collection Time: 04/18/2019  5:13 PM  Specimen: BLOOD RIGHT ARM  Result Value Ref Range Status   Specimen Description BLOOD RIGHT ARM  Final   Special Requests AEROBIC BOTTLE ONLY Blood Culture adequate volume  Final   Culture   Final    NO GROWTH 5 DAYS Performed at [facility], [address], [place], [place] [address]    Report Status 04/15/2019 FINAL  Final  SARS CORONAVIRUS 2 (TAT 6-24 HRS) Nasopharyngeal Nasopharyngeal Swab     Status: Abnormal   Collection Time: 04/17/2019  7:04 PM   Specimen: Nasopharyngeal Swab  Result Value Ref Range Status   SARS Coronavirus 2 POSITIVE (A) NEGATIVE Final    Comment: RESULT CALLED TO, READ BACK BY AND VERIFIED WITH: B.BECK,RN [id number] 04/11/19 G.MCADOO (NOTE) SARS-CoV-2 target nucleic acids are DETECTED. The  SARS-CoV-2 RNA is generally detectable in upper and lower respiratory specimens during the acute phase of infection. Positive results are indicative of the presence of SARS-CoV-2 RNA. Clinical correlation with patient history and other diagnostic information is  necessary to determine patient infection status. Positive results do not rule out bacterial infection or co-infection with other viruses.  The expected result is Negative. Fact Sheet for Patients: SugarRoll.be Fact Sheet for Healthcare Providers: https://www.woods-mathews.com/ This test is not yet approved or cleared by the [place] FDA and  has been authorized for detection and/or diagnosis of SARS-CoV-2 by FDA under an Emergency Use Authorization (EUA). This EUA will remain  in effect (meaning this test can be used) for the dura tion of the COVID-19 declaration under Section 564(b)(1) of the Act, 21 U.S.C. section 360bbb-3(b)(1), unless the authorization is terminated or revoked sooner. Performed at [facility], [place] [address] [place], [place] [address]     Radiology Reports CT Head Wo Contrast  Result Date: 03/29/2019 CLINICAL DATA:  Poly trauma, fell this evening striking head on floor; past history of breast cancer, hypertension, diabetes mellitus, paroxysmal atrial fibrillation EXAM: CT HEAD WITHOUT CONTRAST CT CERVICAL SPINE WITHOUT CONTRAST TECHNIQUE: Multidetector CT imaging of the head and cervical spine was performed following the standard protocol without intravenous contrast. Multiplanar CT image reconstructions of the cervical spine were also generated. COMPARISON:  CT head 03/09/2018, MRI cervical spine 09/10/2007 FINDINGS: CT HEAD FINDINGS Brain: Generalized atrophy. Normal ventricular morphology. No midline shift or mass effect. Mild small vessel chronic ischemic changes of deep cerebral white matter. Streak artifacts at temporal lobes due to motion.  Low-attenuation at LEFT occipital lobe, by sagittal images likely related to same artifacts. No intracranial hemorrhage, mass lesion or definite acute infarction. No extra-axial fluid collections. Vascular: Atherosclerotic calcifications of internal carotid arteries at skull base. Skull: Calvaria intact.  Scattered motion artifacts noted. Sinuses/Orbits: Clear Other: N/A CT CERVICAL SPINE FINDINGS Alignment: Anterolisthesis at C3-C4, unchanged. Remaining alignments normal. Skull base and vertebrae: Osseous demineralization. Skull base intact. Multilevel facet degenerative changes. Multilevel degenerative disc disease changes. Vertebral body heights maintained without fracture or bone destruction. Ankylosis of C4-C5 facet joints. Encroachment upon C3-C4 and C5-C6 neural foramina bilaterally as well as RIGHT C6-C7. Soft tissues and spinal canal: Prevertebral soft tissues normal thickness. Atherosclerotic calcifications within the carotid systems bilaterally. BILATERAL carotid bifurcations extend retropharyngeal. Additional atherosclerotic calcifications at aortic arch. Disc levels:  No additional abnormalities Upper chest: Scarring at RIGHT apex. Other: N/A IMPRESSION: Atrophy with small vessel chronic ischemic changes of deep cerebral white matter. No acute intracranial abnormalities identified on exam limited by mild patient motion artifacts. Degenerative disc and facet disease changes of the cervical spine.  No acute cervical spine abnormalities. Electronically Signed   By: Lavonia Dana M.D.   On: 03/29/2019 21:05   CT Cervical Spine Wo Contrast  Result Date: 03/29/2019 CLINICAL DATA:  Poly trauma, fell this evening striking head on floor; past history of breast cancer, hypertension, diabetes mellitus, paroxysmal atrial fibrillation EXAM: CT HEAD WITHOUT CONTRAST CT CERVICAL SPINE WITHOUT CONTRAST TECHNIQUE: Multidetector CT imaging of the head and cervical spine was performed following the standard protocol  without intravenous contrast. Multiplanar CT image reconstructions of the cervical spine were also generated. COMPARISON:  CT head 03/09/2018, MRI cervical spine 09/10/2007 FINDINGS: CT HEAD FINDINGS Brain: Generalized atrophy. Normal ventricular morphology. No midline shift or mass effect. Mild small vessel chronic ischemic changes of deep cerebral white matter. Streak artifacts at temporal lobes due to motion. Low-attenuation at LEFT occipital lobe, by sagittal images likely related to same artifacts. No intracranial hemorrhage, mass lesion or definite acute infarction. No extra-axial fluid collections. Vascular: Atherosclerotic calcifications of internal carotid arteries at skull base. Skull: Calvaria intact.  Scattered motion artifacts noted. Sinuses/Orbits: Clear Other: N/A CT CERVICAL SPINE FINDINGS Alignment: Anterolisthesis at C3-C4, unchanged. Remaining alignments normal. Skull base and vertebrae: Osseous demineralization. Skull base intact. Multilevel facet degenerative changes. Multilevel degenerative disc disease changes. Vertebral body heights maintained without fracture or bone destruction. Ankylosis of C4-C5 facet joints. Encroachment upon C3-C4 and C5-C6 neural foramina bilaterally as well as RIGHT C6-C7. Soft tissues and spinal canal: Prevertebral soft tissues normal thickness. Atherosclerotic calcifications within the carotid systems bilaterally. BILATERAL carotid bifurcations extend retropharyngeal. Additional atherosclerotic calcifications at aortic arch. Disc levels:  No additional abnormalities Upper chest: Scarring at RIGHT apex. Other: N/A IMPRESSION: Atrophy with small vessel chronic ischemic changes of deep cerebral white matter. No acute intracranial abnormalities identified on exam limited by mild patient motion artifacts. Degenerative disc and facet disease changes of the cervical spine. No acute cervical spine abnormalities. Electronically Signed   By: Lavonia Dana M.D.   On: 03/29/2019  21:05   US Renal  Result Date: 04/15/2019 CLINICAL DATA:  Acute kidney injury EXAM: RENAL / URINARY TRACT ULTRASOUND COMPLETE COMPARISON:  None. FINDINGS: Right Kidney: Renal measurements: 7.9 x 4.2 x 3.9 cm = volume: 65.8 mL. There is increased cortical echogenicity. There is no hydronephrosis. Left Kidney: Renal measurements: 8.7 x 4.7 x 3.6 cm = volume: 77 mL. There is increased cortical echogenicity. There is no hydronephrosis. Bladder: Appears normal for degree of bladder distention. Other: None. IMPRESSION: 1. No acute abnormality. 2. Echogenic kidneys bilaterally which can be seen in patients with medical renal disease. Electronically Signed   By: Constance Holster M.D.   On: 04/05/2019 23:41   DG Chest Port 1 View  Result Date: 04/16/2019 CLINICAL DATA:  Shortness of breath COVID-19 pneumonia. EXAM: PORTABLE CHEST 1 VIEW COMPARISON:  One-view chest x-ray 04/14/2019 FINDINGS: Heart size is stable. Atherosclerotic calcifications present at the aortic arch. Progressive interstitial and airspace opacities are present in both lungs, consistent with worsening pneumonia. There is new gaseous distention of the stomach. IMPRESSION: 1. Progressive interstitial and airspace disease in both lungs compatible with worsening pneumonia. 2. New gaseous distention of the stomach. 3. Aortic atherosclerosis. Electronically Signed   By: San Morelle M.D.   On: 04/16/2019 08:45   DG CHEST PORT 1 VIEW  Result Date: 04/15/2019 CLINICAL DATA:  Oxygen desaturation. ARDS. COVID-19 virus infection. EXAM: PORTABLE CHEST 1 VIEW COMPARISON:  04/14/2019 FINDINGS: Heart size is stable. Diffuse mixed interstitial and airspace opacity throughout both lungs shows  no significant change. No pneumothorax or pleural effusion visualized. IMPRESSION: No significant change in diffuse mixed interstitial and airspace opacity throughout both lungs. Electronically Signed   By: Marlaine Hind M.D.   On: 04/15/2019 08:06   DG Chest Port  1 View  Result Date: 04/14/2019 CLINICAL DATA:  COVID-19 positive, dyspnea EXAM: PORTABLE CHEST 1 VIEW COMPARISON:  04/18/2019 chest radiograph. FINDINGS: Stable cardiomediastinal silhouette with normal heart size. No pneumothorax. No pleural effusion. Extensive patchy opacities throughout both lungs appear worsened. IMPRESSION: Worsening extensive patchy opacities throughout both lungs, compatible with COVID-19 pneumonia. Electronically Signed   By: Ilona Sorrel M.D.   On: 04/14/2019 11:44   DG Chest Portable 1 View  Result Date: 04/14/2019 CLINICAL DATA:  Hypoxia. EXAM: PORTABLE CHEST 1 VIEW COMPARISON:  01/01/2019 FINDINGS: There are diffuse bilateral hazy ground-glass airspace opacities, greatest within the left lung zone. The heart size is borderline enlarged. Aortic calcifications are noted. There is no pneumothorax or large pleural effusion. There is no acute osseous abnormality. IMPRESSION: Diffuse bilateral ground-glass airspace opacities consistent with the patient's history of viral pneumonia. Electronically Signed   By: Constance Holster M.D.   On: 04/19/2019 16:26

## 2019-04-18 NOTE — Progress Notes (Signed)
Occupational Therapy Treatment Patient Details Name: Emily Andrews MRN: [id number] DOB: January 14, 1935 Today's Date: 04/18/2019    History of present illness 85 y/o female w/ hx of paroxysmal a-fib, HTN, HLD, dysrhythmia, DM, cancer, arthritis, anxiety. Pt had fall and hit head on 12/25 tested + COVID 3 weeks prior (to 1/6) was managing at home but had steady decline in function with coughing, congestion, decreased apetite and ambulation. Admitted with Pneumonia due to severe acute respiratory syndrome coronavirus 2 (SARS-CoV-2).   OT comments  Pt today on 15L NRB, increased assist needed for all aspects of transfers and ADL from previous OT session. Max/Mod +2 for SPT to recliner (Pt currently using purewick) Pt mod A for grooming tasks and to take sips of water to manage mask and for fatigue. Pt continues to desaturate with activity dropping to low 80's with sitting EOB, taking about 10 min to recover to 90, and then desaturated again to low 80's with stand pivot transfer to chair, requiring 10 min recovery again. RN in room at end of session, able to witness +2 transfer. OT will continue to follow acutely - changed discharge recommendation to SNF as Pt will require more intensive rehab post-acute.   Follow Up Recommendations  SNF;Supervision/Assistance - 24 hour    Equipment Recommendations  Other (comment)(defe to next venue of care)    Recommendations for Other Services      Precautions / Restrictions Precautions Precautions: Fall;Other (comment) Precaution Comments: Watch O2 Restrictions Weight Bearing Restrictions: No       Mobility Bed Mobility Overal bed mobility: Needs Assistance Bed Mobility: Supine to Sit     Supine to sit: Mod assist;+2 for safety/equipment;HOB elevated     General bed mobility comments: Assist to elevate trunk into sitting and bring hips to EOB.  Transfers Overall transfer level: Needs assistance Equipment used: 2 person hand held  assist Transfers: Sit to/from [facility] Sit to Stand: Max assist;+2 physical assistance;+2 safety/equipment Stand pivot transfers: Mod assist;+2 physical assistance;+2 safety/equipment       General transfer comment: Max A +2 face to face with pad assist to boost into standing from EOB; once upright Pt able to pivot with mod A +2 support under each arm    Balance Overall balance assessment: Needs assistance Sitting-balance support: Bilateral upper extremity supported;Single extremity supported;Feet supported Sitting balance-Leahy Scale: Fair Sitting balance - Comments: dependent on UE support - close min guard   Standing balance support: Bilateral upper extremity supported Standing balance-Leahy Scale: Poor Standing balance comment: dependent on external support                           ADL either performed or assessed with clinical judgement   ADL Overall ADL's : Needs assistance/impaired Eating/Feeding: Moderate assistance;Sitting Eating/Feeding Details (indicate cue type and reason): assist to manage NRB Grooming: Wash/dry face;Moderate assistance;Sitting               Lower Body Dressing: Maximal assistance;Bed level Lower Body Dressing Details (indicate cue type and reason): to don socks Toilet Transfer: Maximal assistance;Moderate assistance;+2 for physical assistance;+2 for safety/equipment;Stand-pivot   Toileting- Clothing Manipulation and Hygiene: Total assistance       Functional mobility during ADLs: Maximal assistance;Moderate assistance;+2 for safety/equipment;+2 for physical assistance       Vision       Perception     Praxis      Cognition Arousal/Alertness: Awake/alert Behavior During Therapy: WFL for tasks assessed/performed;Anxious Overall  Cognitive Status: Impaired/Different from baseline Area of Impairment: Following commands;Problem solving                       Following Commands: Follows one step  commands with increased time     Problem Solving: Slow processing;Requires verbal cues General Comments: extra time for processing, Pt cooperative        Exercises     Shoulder Instructions       General Comments Pt on 15L via NRB, with activity Pt SpO2 82-85% and with cues for pursed lip breathing and NRB, took aprox 10 min before she was able to return to 90%    Pertinent Vitals/ Pain       Pain Assessment: No/denies pain  Home Living                                          Prior Functioning/Environment              Frequency  Min 2X/week        Progress Toward Goals  OT Goals(current goals can now be found in the care plan section)  Progress towards OT goals: Not progressing toward goals - comment(decline in medical status)  Acute Rehab OT Goals Patient Stated Goal: get better OT Goal Formulation: With patient Time For Goal Achievement: 04/26/19 Potential to Achieve Goals: Good  Plan Discharge plan needs to be updated;Frequency remains appropriate    Co-evaluation    PT/OT/SLP Co-Evaluation/Treatment: Yes Reason for Co-Treatment: Complexity of the patient's impairments (multi-system involvement);For patient/therapist safety;To address functional/ADL transfers PT goals addressed during session: Mobility/safety with mobility;Balance OT goals addressed during session: ADL's and self-care      AM-PAC OT "6 Clicks" Daily Activity     Outcome Measure   Help from another person eating meals?: A Lot Help from another person taking care of personal grooming?: A Lot Help from another person toileting, which includes using toliet, bedpan, or urinal?: A Lot Help from another person bathing (including washing, rinsing, drying)?: A Lot Help from another person to put on and taking off regular upper body clothing?: A Lot Help from another person to put on and taking off regular lower body clothing?: Total 6 Click Score: 11    End of Session  Equipment Utilized During Treatment: Oxygen(15L via NRB)  OT Visit Diagnosis: Unsteadiness on feet (R26.81);Other abnormalities of gait and mobility (R26.89);Muscle weakness (generalized) (M62.81)   Activity Tolerance Patient tolerated treatment well   Patient Left in chair;with call bell/phone within reach;with chair alarm set;with nursing/sitter in room   Nurse Communication Mobility status;Other (comment)(SpO2)        Time: 5498-2641 OT Time Calculation (min): 24 min  Charges: OT General Charges $OT Visit: 1 Visit OT Treatments $Self Care/Home Management : 8-22 mins  Hulda Humphrey OTR/L Acute Rehabilitation Services Pager: [phone] Office: [place] 04/18/2019, 10:41 AM

## 2019-04-18 NOTE — Plan of Care (Signed)
  Problem: Education: Goal: Knowledge of risk factors and measures for prevention of condition will improve Outcome: Progressing   Problem: Coping: Goal: Psychosocial and spiritual needs will be supported Outcome: Progressing   Problem: Respiratory: Goal: Will maintain a patent airway Outcome: Progressing   Problem: Respiratory: Goal: Complications related to the disease process, condition or treatment will be avoided or minimized Outcome: Progressing   Problem: Education: Goal: Knowledge of General Education information will improve Description: Including pain rating scale, medication(s)/side effects and non-pharmacologic comfort measures Outcome: Progressing   Problem: Clinical Measurements: Goal: Ability to maintain clinical measurements within normal limits will improve Outcome: Progressing Goal: Will remain free from infection Outcome: Progressing Goal: Diagnostic test results will improve Outcome: Progressing Goal: Respiratory complications will improve Outcome: Progressing Goal: Cardiovascular complication will be avoided Outcome: Progressing

## 2019-04-18 NOTE — Progress Notes (Signed)
RN conversed with Mr. Luiz Ochoa (spouse) about pt's daily progress and POC.  No family concerns at this time. RN will continue to monitor/assess pt

## 2019-04-18 NOTE — Progress Notes (Signed)
Physical Therapy Treatment Patient Details Name: Emily Andrews MRN: [id number] DOB: 04-15-1934 Today's Date: 04/18/2019    History of Present Illness 85 y/o female w/ hx of paroxysmal a-fib, HTN, HLD, dysrhythmia, DM, cancer, arthritis, anxiety. Pt had fall and hit head on 12/25 tested + COVID 3 weeks prior (to 1/6) was managing at home but had steady decline in function with coughing, congestion, decreased apetite and ambulation. Admitted with Pneumonia due to severe acute respiratory syndrome coronavirus 2 (SARS-CoV-2).    PT Comments    Pt making slow progress. Able to tolerate OOB to chair.    Follow Up Recommendations  SNF     Equipment Recommendations  None recommended by PT    Recommendations for Other Services       Precautions / Restrictions Precautions Precautions: Fall;Other (comment) Precaution Comments: Watch O2 Restrictions Weight Bearing Restrictions: No    Mobility  Bed Mobility Overal bed mobility: Needs Assistance Bed Mobility: Supine to Sit     Supine to sit: Mod assist;+2 for safety/equipment;HOB elevated     General bed mobility comments: Assist to elevate trunk into sitting and bring hips to EOB.  Transfers Overall transfer level: Needs assistance Equipment used: 2 person hand held assist Transfers: Sit to/from [facility] Sit to Stand: Max assist;+2 physical assistance;+2 safety/equipment Stand pivot transfers: Mod assist;+2 physical assistance;+2 safety/equipment       General transfer comment: Max A +2 face to face with pad assist to boost into standing from EOB; once upright Pt able to pivot with mod A +2 support under each arm  Ambulation/Gait                 Stairs             Wheelchair Mobility    Modified Rankin (Stroke Patients Only)       Balance Overall balance assessment: Needs assistance Sitting-balance support: Bilateral upper extremity supported;Single extremity supported;Feet  supported Sitting balance-Leahy Scale: Fair Sitting balance - Comments: dependent on UE support - close min guard   Standing balance support: Bilateral upper extremity supported Standing balance-Leahy Scale: Poor Standing balance comment: +2 mod to maintain standing                            Cognition Arousal/Alertness: Awake/alert Behavior During Therapy: WFL for tasks assessed/performed;Anxious Overall Cognitive Status: Impaired/Different from baseline Area of Impairment: Following commands;Problem solving                       Following Commands: Follows one step commands with increased time     Problem Solving: Slow processing;Requires verbal cues General Comments: extra time for processing, Pt cooperative      Exercises General Exercises - Lower Extremity Ankle Circles/Pumps: AROM;Both;5 reps;Supine    General Comments General comments (skin integrity, edema, etc.): Pt on 15L via NRB. SpO2 to low 80's with activity and returned to 88-90 after 10 minutes.      Pertinent Vitals/Pain Pain Assessment: No/denies pain    Home Living                      Prior Function            PT Goals (current goals can now be found in the care plan section) Acute Rehab PT Goals Patient Stated Goal: get better Progress towards PT goals: Progressing toward goals    Frequency    Min  2X/week      PT Plan Current plan remains appropriate    Co-evaluation PT/OT/SLP Co-Evaluation/Treatment: Yes Reason for Co-Treatment: Complexity of the patient's impairments (multi-system involvement);For patient/therapist safety PT goals addressed during session: Mobility/safety with mobility;Balance OT goals addressed during session: ADL's and self-care      AM-PAC PT "6 Clicks" Mobility   Outcome Measure  Help needed turning from your back to your side while in a flat bed without using bedrails?: A Lot Help needed moving from lying on your back to sitting  on the side of a flat bed without using bedrails?: A Lot Help needed moving to and from a bed to a chair (including a wheelchair)?: Total Help needed standing up from a chair using your arms (e.g., wheelchair or bedside chair)?: Total Help needed to walk in hospital room?: Total Help needed climbing 3-5 steps with a railing? : Total 6 Click Score: 8    End of Session Equipment Utilized During Treatment: Gait belt;Oxygen Activity Tolerance: Patient limited by fatigue Patient left: in chair;with call bell/phone within reach;with chair alarm set Nurse Communication: Mobility status(nurse in room during mobility) PT Visit Diagnosis: Other abnormalities of gait and mobility (R26.89);Muscle weakness (generalized) (M62.81)     Time: 8416-6063 PT Time Calculation (min) (ACUTE ONLY): 26 min  Charges:  $Therapeutic Activity: 8-22 mins                     [place] Pager [phone] Office [place] 04/18/2019, 10:56 AM

## 2019-04-19 LAB — COMPREHENSIVE METABOLIC PANEL
ALT: 26 U/L (ref 0–44)
AST: 32 U/L (ref 15–41)
Albumin: 3.5 g/dL (ref 3.5–5.0)
Alkaline Phosphatase: 81 U/L (ref 38–126)
Anion gap: 19 — ABNORMAL HIGH (ref 5–15)
BUN: 109 mg/dL — ABNORMAL HIGH (ref 8–23)
CO2: 34 mmol/L — ABNORMAL HIGH (ref 22–32)
Calcium: 9.9 mg/dL (ref 8.9–10.3)
Chloride: 94 mmol/L — ABNORMAL LOW (ref 98–111)
Creatinine, Ser: 2.09 mg/dL — ABNORMAL HIGH (ref 0.44–1.00)
GFR calc Af Amer: 25 mL/min — ABNORMAL LOW (ref >60)
GFR calc non Af Amer: 22 mL/min — ABNORMAL LOW (ref >60)
Glucose, Bld: 224 mg/dL — ABNORMAL HIGH (ref 70–99)
Potassium: 3.7 mmol/L (ref 3.5–5.1)
Sodium: 147 mmol/L — ABNORMAL HIGH (ref 135–145)
Total Bilirubin: 1.5 mg/dL — ABNORMAL HIGH (ref 0.3–1.2)
Total Protein: 6.9 g/dL (ref 6.5–8.1)

## 2019-04-19 LAB — CBC WITH DIFFERENTIAL/PLATELET
Abs Immature Granulocytes: 0.09 10*3/uL — ABNORMAL HIGH (ref 0.00–0.07)
Basophils Absolute: 0 10*3/uL (ref 0.0–0.1)
Basophils Relative: 0 %
Eosinophils Absolute: 0 10*3/uL (ref 0.0–0.5)
Eosinophils Relative: 0 %
HCT: 46.1 % — ABNORMAL HIGH (ref 36.0–46.0)
Hemoglobin: 14.7 g/dL (ref 12.0–15.0)
Immature Granulocytes: 1 %
Lymphocytes Relative: 13 %
Lymphs Abs: 1.3 10*3/uL (ref 0.7–4.0)
MCH: 30.1 pg (ref 26.0–34.0)
MCHC: 31.9 g/dL (ref 30.0–36.0)
MCV: 94.3 fL (ref 80.0–100.0)
Monocytes Absolute: 0.6 10*3/uL (ref 0.1–1.0)
Monocytes Relative: 6 %
Neutro Abs: 7.8 10*3/uL — ABNORMAL HIGH (ref 1.7–7.7)
Neutrophils Relative %: 80 %
Platelets: 372 10*3/uL (ref 150–400)
RBC: 4.89 MIL/uL (ref 3.87–5.11)
RDW: 15 % (ref 11.5–15.5)
WBC: 9.8 10*3/uL (ref 4.0–10.5)
nRBC: 0.3 % — ABNORMAL HIGH (ref 0.0–0.2)

## 2019-04-19 LAB — D-DIMER, QUANTITATIVE: D-Dimer, Quant: 5.99 ug/mL-FEU — ABNORMAL HIGH (ref 0.00–0.50)

## 2019-04-19 LAB — MAGNESIUM: Magnesium: 2.5 mg/dL — ABNORMAL HIGH (ref 1.7–2.4)

## 2019-04-19 LAB — C-REACTIVE PROTEIN: CRP: 0.7 mg/dL (ref <1.0)

## 2019-04-19 LAB — BRAIN NATRIURETIC PEPTIDE: B Natriuretic Peptide: 77.9 pg/mL (ref 0.0–100.0)

## 2019-04-19 MED ORDER — ENSURE ENLIVE PO LIQD
237.0000 mL | Freq: Three times a day (TID) | ORAL | Status: DC
  Administered 2019-04-19 (×2): 237 mL via ORAL

## 2019-04-19 NOTE — Progress Notes (Addendum)
Initial Nutrition Assessment RD working remotely.  DOCUMENTATION CODES:   Not applicable  INTERVENTION:   Ensure Enlive po TID, each supplement provides 350 kcal and 20 grams of protein.  Magic cup BID with meals, each supplement provides 290 kcal and 9 grams of protein.  Recommend liberalize diet to Regular.  NUTRITION DIAGNOSIS:   Increased nutrient needs related to acute illness(COVID) as evidenced by estimated needs.  GOAL:   Patient will meet greater than or equal to 90% of their needs  MONITOR:   PO intake, Supplement acceptance, Labs  REASON FOR ASSESSMENT:   Malnutrition Screening Tool    ASSESSMENT:   85 yo female admitted with acute hypoxic respiratory failure related to COVID-19 PNA. Diagnosed with COVID-19 3 weeks PTA. PMH includes DM, PAF, HLD, HTN, breast cancer.    Patient has been consuming </= 25% of meals since for the past few days. Intake has been poor since hospital admission.    Currently requiring 12 L oxygen, HFNC  Last weight 73.5 kg on 1/6, down from 75.3 kg on 01/11/19. 2.5% weight loss within the past 3 months is not significant for the time frame. Suspect even greater weight loss given poor intake since admission.   Labs reviewed. Sodium 147 (H), BUN 109 (H), creatinine 2.09 (H), mag 2.5 (H) CBG's: 113 (last recorded CBG was on 1/13)  Medications reviewed and include vitamin C, decadron, folic acid, MVI, thiamine, vitamin B-12, zinc sulfate.   NUTRITION - FOCUSED PHYSICAL EXAM:  unable to complete due to COVID restrictions  Diet Order:   Diet Order            Diet heart healthy/carb modified Room service appropriate? Yes; Fluid consistency: Thin  Diet effective now              EDUCATION NEEDS:   Not appropriate for education at this time  Skin:  Skin Assessment: Reviewed RN Assessment  Last BM:  1/10  Height:   Ht Readings from Last 1 Encounters:  04/06/2019 5\' 5"  (1.651 m)    Weight:   Wt Readings from Last 1  Encounters:  04/11/2019 73.5 kg    Ideal Body Weight:  56.8 kg  BMI:  Body mass index is 26.96 kg/m.  Estimated Nutritional Needs:   Kcal:  2200-2400  Protein:  105-125 gm  Fluid:  >/= 2 L    Molli Barrows, RD, LDN, [place] Pager [phone] After Hours Pager [phone]

## 2019-04-19 NOTE — Progress Notes (Signed)
PROGRESS NOTE                                                                                                                                                                                                             Patient Demographics:    Emily Andrews, is a 85 y.o. female, DOB - [birth year], WJX:[id number]  Outpatient Primary MD for the patient is Orpah Melter, MD    LOS - 9  Admit date - 04/26/2019    Chief Complaint  Patient presents with  . Shortness of Breath    Covid+       Brief Narrative   85 year old Caucasian female with past medical history significant for proximal atrial fibrillation, [place] vas 2 score of at least 3 on Coumadin, hypertension, hyperlipidemia and diabetes mellitus.  Apparently, patient tested positive for COVID-19 virus infection about 3 weeks ago, she presented with shortness of breath was diagnosed with acute hypoxic respiratory failure due to COVID-19 pneumonia and admitted to hospital.   Subjective:   Patient in bed, appears fatigued, denies any headache, appetite remains poor, but it is improving per staff.   Assessment  & Plan :   Acute Hypoxic Resp. Failure due to Acute Covid 19 Viral Pneumonitis during the ongoing 2020 Covid 19 Pandemic  - she has severe disease, significant oxygen requirement, she did require heated high flow 40 L / 100%, this has been improving, this morning she is tolerating 15 L high flow nasal cannula. - Continue with  on IV steroids, - Continue with remdesivir X2, 1/7 and 1/11  -Patient with  volume overload initially, on significant diuresis, currently on hold given she appears to be dry and hypernatremia. - Patient is extremely frail, tenuous, and extremely at high risk for decompensation -Patient remains very tenuous, risk for decompensation, she remains DNR,    SpO2: 96 % O2 Flow Rate (L/min): 12 L/min FiO2 (%): 100 %   Recent Labs  Lab  04/13/19 0404 04/13/19 0404 04/14/19 0420 04/14/19 0420 04/15/19 0547 04/16/19 0800 04/16/19 0840 04/17/19 0355 04/18/19 0341 04/19/19 0010  CRP 7.7*   < > 4.4*   < > 2.6*  --  1.4* 0.9 0.7 0.7  DDIMER >20.00*   < > 17.22*   < > 13.53*  --  10.94* 9.36* 7.83* 5.99*  BNP 356.5*   < > 162.4*   < >  232.6*  --  102.8* 70.9 68.2 77.9  PROCALCITON 0.39  --  0.34  --  0.28 <0.10  --   --   --   --    < > = values in this interval not displayed.    Hepatic Function Latest Ref Rng & Units 04/19/2019 04/18/2019 04/17/2019  Total Protein 6.5 - 8.1 g/dL 6.9 6.5 6.2(L)  Albumin 3.5 - 5.0 g/dL 3.5 3.3(L) 2.9(L)  AST 15 - 41 U/L 32 34 35  ALT 0 - 44 U/L _0 Alk Phosphatase 38 - 126 U/L 81 80 83  Total Bilirubin 0.3 - 1.2 mg/dL 1.5(H) 2.0(H) 1.7(H)  Bilirubin, Direct 0.0 - 0.3 mg/dL - - -     Acute on chronic diastolic CHF EF 20% on echocardiogram done in 2019 -Patient on IV Lasix 80 mg IV twice daily, and Zaroxolyn, has stopped today given she appears dry, tachycardic with hypernatremia.  Hypokalemia -Repleted,   AKI  -could be ATN due to recent infection, was also on ARB.  - Renal ultrasound nonacute but does show chronic kidney disease.   -Baseline creatinine 1.3, peaked at 3.6, improving, monitor closely as she is on IV diuresis .  Paroxysmal atrial fibrillation.   - [place] vas 2 score was greater than 3, she was on Coumadin with erratic INR control, when she was admitted her INR was greater than 10, Coumadin has been reversed with vitamin K IV, INR stable will be transitioned to Lovenox. -Continue with flecainide  UTI. Finished Keflex for a total of 3 days.  Spike in D-dimer.  This happened after her vitamin K was reversed, question if she has thrown a clot, although she is on Eliquis will challenge her with a few days of Lovenox starting on 04/14/2019, she has also become quite short of breath after improvement on 04/14/2019.  Will monitor closely.  Failure to thrive -Patient  remains with poor appetite, started on Ensure, have liberalized her diet to regular  Condition - Guarded +++  Family Communication  : Discussed with daughter via phone 1/14  Code Status :  DNR  Diet :   Diet Order            Diet regular Room service appropriate? Yes; Fluid consistency: Thin  Diet effective now               Disposition Plan  :  TBD  Consults  :  None  Procedures  :    Renal US - non acute  PUD Prophylaxis : PPI  DVT Prophylaxis  : Coumadin/Eliquis/Lovenox  Lab Results  Component Value Date   INR 2.0 (H) 04/12/2019   INR >10.0 (HH) 04/11/2019   INR >10.0 (HH) 05/05/2019     Lab Results  Component Value Date   PLT 372 04/19/2019    Inpatient Medications  Scheduled Meds: . vitamin C  500 mg Oral Daily  . dexamethasone (DECADRON) injection  10 mg Intravenous Q24H  . enoxaparin (LOVENOX) injection  1 mg/kg Subcutaneous Q24H  . ezetimibe  10 mg Oral QPM  . feeding supplement (ENSURE ENLIVE)  237 mL Oral TID BM  . flecainide  50 mg Oral BID  . folic acid  1 mg Oral Daily  . Ipratropium-Albuterol  1 puff Inhalation Q6H  . multivitamin with minerals  1 tablet Oral Daily  . pantoprazole  40 mg Oral BID  . QUEtiapine  25 mg Oral BID  . tamoxifen  20 mg Oral Daily  . thiamine  100 mg Oral Daily  . cyanocobalamin  100 mcg Oral Daily  . zinc sulfate  220 mg Oral Daily   Continuous Infusions:  PRN Meds:.ALPRAZolam, bisacodyl, fluticasone, ondansetron (ZOFRAN) IV, sodium chloride  Antibiotics  :    Anti-infectives (From admission, onward)   Start     Dose/Rate Route Frequency Ordered Stop   04/12/19 1000  remdesivir 100 mg in sodium chloride 0.9 % 100 mL IVPB  Status:  Discontinued     100 mg 200 mL/hr over 30 Minutes Intravenous Daily 04/11/19 0433 04/11/19 0446   04/12/19 1000  remdesivir 100 mg in sodium chloride 0.9 % 100 mL IVPB     100 mg 200 mL/hr over 30 Minutes Intravenous Daily 04/11/19 0447 04/15/19 1936   04/12/19 0800   cephALEXin (KEFLEX) capsule 250 mg     250 mg Oral Daily 04/12/19 0753 04/16/19 0802   04/12/19 0745  cephALEXin (KEFLEX) capsule 500 mg  Status:  Discontinued     500 mg Oral Every 12 hours 04/12/19 0736 04/12/19 0751   04/11/19 1000  remdesivir 100 mg in sodium chloride 0.9 % 100 mL IVPB  Status:  Discontinued     100 mg 200 mL/hr over 30 Minutes Intravenous Daily 05/04/2019 2234 04/11/19 0447   04/11/19 0500  azithromycin (ZITHROMAX) 500 mg in sodium chloride 0.9 % 250 mL IVPB  Status:  Discontinued     500 mg 250 mL/hr over 60 Minutes Intravenous Daily 04/11/19 0433 04/11/19 0734   04/11/19 0500  cefTRIAXone (ROCEPHIN) 1 g in sodium chloride 0.9 % 100 mL IVPB  Status:  Discontinued     1 g 200 mL/hr over 30 Minutes Intravenous Daily 04/11/19 0433 04/11/19 0734   04/11/19 0445  remdesivir 200 mg in sodium chloride 0.9% 250 mL IVPB  Status:  Discontinued     200 mg 580 mL/hr over 30 Minutes Intravenous Once 04/11/19 0433 04/11/19 0551   04/07/2019 2245  remdesivir 200 mg in sodium chloride 0.9% 250 mL IVPB     200 mg 580 mL/hr over 30 Minutes Intravenous Once 04/20/2019 2234 04/11/19 0618       Time Spent in minutes  35   Emeline Gins Geddy [place] M.D on 04/19/2019 at 4:58 PM  To page go to [email or web address] - password [facility]  [facility] -  Office  [phone]    See all Orders from today for further details    Objective:   Vitals:   04/19/19 0800 04/19/19 1200 04/19/19 1400 04/19/19 1600  BP: 130/89 128/74  109/85  Pulse: (!) 109 (!) 119  (!) 118  Resp: (!) 22 (!) 26 17 (!) 25  Temp: 97.7 F (36.5 C) 97.8 F (36.6 C)    TempSrc: Axillary Axillary    SpO2: 93% 98%  96%  Weight:      Height:        Wt Readings from Last 3 Encounters:  04/21/2019 73.5 kg  03/29/19 73.5 kg  02/04/19 75.1 kg     Intake/Output Summary (Last 24 hours) at 04/19/2019 1658 Last data filed at 04/19/2019 1003 Gross per 24 hour  Intake 25 ml  Output 550 ml  Net -525 ml     Physical  Exam  Awake, alert, communicative, tremulous frail, chronically ill-appearing. Symmetrical Chest wall movement, Good air movement bilaterally, CTAB Tachycardic,No Gallops,Rubs or new Murmurs, No Parasternal Heave +ve B.Sounds, Abd Soft, No tenderness, No rebound - guarding or rigidity. No Cyanosis, Clubbing or edema, No new Rash or bruise  Data Review:    CBC Recent Labs  Lab 04/14/19 0420 04/14/19 0420 04/15/19 0547 04/16/19 0840 04/17/19 0355 04/18/19 0341 04/19/19 0010  WBC 8.8   < > 9.8 8.6 8.2 8.8 9.8  HGB 13.6   < > 13.4 13.7 14.1 14.0 14.7  HCT 40.9   < > 40.8 42.0 43.6 43.8 46.1*  PLT 295   < > 279 300 321 345 372  MCV 90.9   < > 92.7 92.5 92.8 94.0 94.3  MCH 30.2   < > 30.5 30.2 30.0 30.0 30.1  MCHC 33.3   < > 32.8 32.6 32.3 32.0 31.9  RDW 14.6   < > 14.9 14.7 14.9 14.7 15.0  LYMPHSABS 1.4  --  1.2  --  1.3 1.6 1.3  MONOABS 0.4  --  0.4  --  0.3 0.6 0.6  EOSABS 0.1  --  0.0  --  0.1 0.0 0.0  BASOSABS 0.0  --  0.0  --  0.0 0.0 0.0   < > = values in this interval not displayed.    Chemistries  Recent Labs  Lab 04/14/19 0420 04/14/19 0420 04/15/19 0547 04/16/19 0840 04/17/19 0355 04/18/19 0341 04/19/19 0010  NA 137   < > 142 143 145 145 147*  K 4.6   < > 4.4 3.8 3.9 3.4* 3.7  CL 96*   < > 99 98 95* 94* 94*  CO2 27   < > 29 29 33* 35* 34*  GLUCOSE 104*   < > 153* 147* 165* 182* 224*  BUN 97*   < > 104* 99* 108* 96* 109*  CREATININE 2.77*   < > 2.54* 2.21* 2.08* 2.14* 2.09*  CALCIUM 8.7*   < > 8.8* 9.0 9.1 9.4 9.9  MG 2.0   < > 2.2 2.0 2.1 2.3 2.5*  AST 44*  --  46*  --  35 34 32  ALT 39  --  36  --  _0 ALKPHOS 111  --  89  --  83 80 81  BILITOT 1.1  --  0.9  --  1.7* 2.0* 1.5*   < > = values in this interval not displayed.   ------------------------------------------------------------------------------------------------------------------ No results for input(s): CHOL, HDL, LDLCALC, TRIG, CHOLHDL, LDLDIRECT in the last 72 hours.  Lab  Results  Component Value Date   HGBA1C 5.5 03/23/2018   ------------------------------------------------------------------------------------------------------------------ No results for input(s): TSH, T4TOTAL, T3FREE, THYROIDAB in the last 72 hours.  Invalid input(s): FREET3  Cardiac Enzymes No results for input(s): CKMB, TROPONINI, MYOGLOBIN in the last 168 hours.  Invalid input(s): CK ------------------------------------------------------------------------------------------------------------------    Component Value Date/Time   BNP 77.9 04/19/2019 0010    Micro Results Recent Results (from the past 240 hour(s))  Blood Culture (routine x 2)     Status: None   Collection Time: 04/18/2019  5:13 PM   Specimen: BLOOD RIGHT WRIST  Result Value Ref Range Status   Specimen Description BLOOD RIGHT WRIST  Final   Special Requests   Final    BOTTLES DRAWN AEROBIC AND ANAEROBIC Blood Culture results may not be optimal due to an inadequate volume of blood received in culture bottles   Culture   Final    NO GROWTH 5 DAYS Performed at [facility], [place] [address], [place], [place] [address]    Report Status 04/15/2019 FINAL  Final  Blood Culture (routine x 2)     Status: None   Collection Time: 04/30/2019  5:13  PM   Specimen: BLOOD RIGHT ARM  Result Value Ref Range Status   Specimen Description BLOOD RIGHT ARM  Final   Special Requests AEROBIC BOTTLE ONLY Blood Culture adequate volume  Final   Culture   Final    NO GROWTH 5 DAYS Performed at [facility], [address] [place], [place] [address]    Report Status 04/15/2019 FINAL  Final  SARS CORONAVIRUS 2 (TAT 6-24 HRS) Nasopharyngeal Nasopharyngeal Swab     Status: Abnormal   Collection Time: 04/08/2019  7:04 PM   Specimen: Nasopharyngeal Swab  Result Value Ref Range Status   SARS Coronavirus 2 POSITIVE (A) NEGATIVE Final    Comment: RESULT CALLED TO, READ BACK BY AND VERIFIED WITH: B.BECK,RN 0046 04/11/19  G.MCADOO (NOTE) SARS-CoV-2 target nucleic acids are DETECTED. The SARS-CoV-2 RNA is generally detectable in upper and lower respiratory specimens during the acute phase of infection. Positive results are indicative of the presence of SARS-CoV-2 RNA. Clinical correlation with patient history and other diagnostic information is  necessary to determine patient infection status. Positive results do not rule out bacterial infection or co-infection with other viruses.  The expected result is Negative. Fact Sheet for Patients: SugarRoll.be Fact Sheet for Healthcare Providers: https://www.woods-mathews.com/ This test is not yet approved or cleared by the [place] FDA and  has been authorized for detection and/or diagnosis of SARS-CoV-2 by FDA under an Emergency Use Authorization (EUA). This EUA will remain  in effect (meaning this test can be used) for the dura tion of the COVID-19 declaration under Section 564(b)(1) of the Act, 21 U.S.C. section 360bbb-3(b)(1), unless the authorization is terminated or revoked sooner. Performed at [facility], [place] [address] [place],  [address]     Radiology Reports CT Head Wo Contrast  Result Date: 03/29/2019 CLINICAL DATA:  Poly trauma, fell this evening striking head on floor; past history of breast cancer, hypertension, diabetes mellitus, paroxysmal atrial fibrillation EXAM: CT HEAD WITHOUT CONTRAST CT CERVICAL SPINE WITHOUT CONTRAST TECHNIQUE: Multidetector CT imaging of the head and cervical spine was performed following the standard protocol without intravenous contrast. Multiplanar CT image reconstructions of the cervical spine were also generated. COMPARISON:  CT head 03/09/2018, MRI cervical spine 09/10/2007 FINDINGS: CT HEAD FINDINGS Brain: Generalized atrophy. Normal ventricular morphology. No midline shift or mass effect. Mild small vessel chronic ischemic changes of deep cerebral white  matter. Streak artifacts at temporal lobes due to motion. Low-attenuation at LEFT occipital lobe, by sagittal images likely related to same artifacts. No intracranial hemorrhage, mass lesion or definite acute infarction. No extra-axial fluid collections. Vascular: Atherosclerotic calcifications of internal carotid arteries at skull base. Skull: Calvaria intact.  Scattered motion artifacts noted. Sinuses/Orbits: Clear Other: N/A CT CERVICAL SPINE FINDINGS Alignment: Anterolisthesis at C3-C4, unchanged. Remaining alignments normal. Skull base and vertebrae: Osseous demineralization. Skull base intact. Multilevel facet degenerative changes. Multilevel degenerative disc disease changes. Vertebral body heights maintained without fracture or bone destruction. Ankylosis of C4-C5 facet joints. Encroachment upon C3-C4 and C5-C6 neural foramina bilaterally as well as RIGHT C6-C7. Soft tissues and spinal canal: Prevertebral soft tissues normal thickness. Atherosclerotic calcifications within the carotid systems bilaterally. BILATERAL carotid bifurcations extend retropharyngeal. Additional atherosclerotic calcifications at aortic arch. Disc levels:  No additional abnormalities Upper chest: Scarring at RIGHT apex. Other: N/A IMPRESSION: Atrophy with small vessel chronic ischemic changes of deep cerebral white matter. No acute intracranial abnormalities identified on exam limited by mild patient motion artifacts. Degenerative disc and facet disease changes of  the cervical spine. No acute cervical spine abnormalities. Electronically Signed   By: Lavonia Dana M.D.   On: 03/29/2019 21:05   CT Cervical Spine Wo Contrast  Result Date: 03/29/2019 CLINICAL DATA:  Poly trauma, fell this evening striking head on floor; past history of breast cancer, hypertension, diabetes mellitus, paroxysmal atrial fibrillation EXAM: CT HEAD WITHOUT CONTRAST CT CERVICAL SPINE WITHOUT CONTRAST TECHNIQUE: Multidetector CT imaging of the head and  cervical spine was performed following the standard protocol without intravenous contrast. Multiplanar CT image reconstructions of the cervical spine were also generated. COMPARISON:  CT head 03/09/2018, MRI cervical spine 09/10/2007 FINDINGS: CT HEAD FINDINGS Brain: Generalized atrophy. Normal ventricular morphology. No midline shift or mass effect. Mild small vessel chronic ischemic changes of deep cerebral white matter. Streak artifacts at temporal lobes due to motion. Low-attenuation at LEFT occipital lobe, by sagittal images likely related to same artifacts. No intracranial hemorrhage, mass lesion or definite acute infarction. No extra-axial fluid collections. Vascular: Atherosclerotic calcifications of internal carotid arteries at skull base. Skull: Calvaria intact.  Scattered motion artifacts noted. Sinuses/Orbits: Clear Other: N/A CT CERVICAL SPINE FINDINGS Alignment: Anterolisthesis at C3-C4, unchanged. Remaining alignments normal. Skull base and vertebrae: Osseous demineralization. Skull base intact. Multilevel facet degenerative changes. Multilevel degenerative disc disease changes. Vertebral body heights maintained without fracture or bone destruction. Ankylosis of C4-C5 facet joints. Encroachment upon C3-C4 and C5-C6 neural foramina bilaterally as well as RIGHT C6-C7. Soft tissues and spinal canal: Prevertebral soft tissues normal thickness. Atherosclerotic calcifications within the carotid systems bilaterally. BILATERAL carotid bifurcations extend retropharyngeal. Additional atherosclerotic calcifications at aortic arch. Disc levels:  No additional abnormalities Upper chest: Scarring at RIGHT apex. Other: N/A IMPRESSION: Atrophy with small vessel chronic ischemic changes of deep cerebral white matter. No acute intracranial abnormalities identified on exam limited by mild patient motion artifacts. Degenerative disc and facet disease changes of the cervical spine. No acute cervical spine abnormalities.  Electronically Signed   By: Lavonia Dana M.D.   On: 03/29/2019 21:05   US Renal  Result Date: 04/24/2019 CLINICAL DATA:  Acute kidney injury EXAM: RENAL / URINARY TRACT ULTRASOUND COMPLETE COMPARISON:  None. FINDINGS: Right Kidney: Renal measurements: 7.9 x 4.2 x 3.9 cm = volume: 65.8 mL. There is increased cortical echogenicity. There is no hydronephrosis. Left Kidney: Renal measurements: 8.7 x 4.7 x 3.6 cm = volume: 77 mL. There is increased cortical echogenicity. There is no hydronephrosis. Bladder: Appears normal for degree of bladder distention. Other: None. IMPRESSION: 1. No acute abnormality. 2. Echogenic kidneys bilaterally which can be seen in patients with medical renal disease. Electronically Signed   By: Constance Holster M.D.   On: 04/15/2019 23:41   DG Chest Port 1 View  Result Date: 04/16/2019 CLINICAL DATA:  Shortness of breath COVID-19 pneumonia. EXAM: PORTABLE CHEST 1 VIEW COMPARISON:  One-view chest x-ray 04/14/2019 FINDINGS: Heart size is stable. Atherosclerotic calcifications present at the aortic arch. Progressive interstitial and airspace opacities are present in both lungs, consistent with worsening pneumonia. There is new gaseous distention of the stomach. IMPRESSION: 1. Progressive interstitial and airspace disease in both lungs compatible with worsening pneumonia. 2. New gaseous distention of the stomach. 3. Aortic atherosclerosis. Electronically Signed   By: San Morelle M.D.   On: 04/16/2019 08:45   DG CHEST PORT 1 VIEW  Result Date: 04/15/2019 CLINICAL DATA:  Oxygen desaturation. ARDS. COVID-19 virus infection. EXAM: PORTABLE CHEST 1 VIEW COMPARISON:  04/14/2019 FINDINGS: Heart size is stable. Diffuse mixed interstitial and airspace opacity throughout  both lungs shows no significant change. No pneumothorax or pleural effusion visualized. IMPRESSION: No significant change in diffuse mixed interstitial and airspace opacity throughout both lungs. Electronically Signed    By: Marlaine Hind M.D.   On: 04/15/2019 08:06   DG Chest Port 1 View  Result Date: 04/14/2019 CLINICAL DATA:  COVID-19 positive, dyspnea EXAM: PORTABLE CHEST 1 VIEW COMPARISON:  04/30/2019 chest radiograph. FINDINGS: Stable cardiomediastinal silhouette with normal heart size. No pneumothorax. No pleural effusion. Extensive patchy opacities throughout both lungs appear worsened. IMPRESSION: Worsening extensive patchy opacities throughout both lungs, compatible with COVID-19 pneumonia. Electronically Signed   By: Ilona Sorrel M.D.   On: 04/14/2019 11:44   DG Chest Portable 1 View  Result Date: 05/02/2019 CLINICAL DATA:  Hypoxia. EXAM: PORTABLE CHEST 1 VIEW COMPARISON:  01/01/2019 FINDINGS: There are diffuse bilateral hazy ground-glass airspace opacities, greatest within the left lung zone. The heart size is borderline enlarged. Aortic calcifications are noted. There is no pneumothorax or large pleural effusion. There is no acute osseous abnormality. IMPRESSION: Diffuse bilateral ground-glass airspace opacities consistent with the patient's history of viral pneumonia. Electronically Signed   By: Constance Holster M.D.   On: 04/06/2019 16:26

## 2019-04-19 NOTE — Progress Notes (Signed)
Ambulation Note  Assisted RN in moving pt from bed to chair.  Philis Kendall, MS, ACSM CEP 10:00 AM 04/19/2019

## 2019-04-20 LAB — CBC WITH DIFFERENTIAL/PLATELET
Abs Immature Granulocytes: 0.13 10*3/uL — ABNORMAL HIGH (ref 0.00–0.07)
Basophils Absolute: 0.1 10*3/uL (ref 0.0–0.1)
Basophils Relative: 0 %
Eosinophils Absolute: 0 10*3/uL (ref 0.0–0.5)
Eosinophils Relative: 0 %
HCT: 44.8 % (ref 36.0–46.0)
Hemoglobin: 14.7 g/dL (ref 12.0–15.0)
Immature Granulocytes: 1 %
Lymphocytes Relative: 13 %
Lymphs Abs: 1.6 10*3/uL (ref 0.7–4.0)
MCH: 30.7 pg (ref 26.0–34.0)
MCHC: 32.8 g/dL (ref 30.0–36.0)
MCV: 93.5 fL (ref 80.0–100.0)
Monocytes Absolute: 1 10*3/uL (ref 0.1–1.0)
Monocytes Relative: 8 %
Neutro Abs: 10.1 10*3/uL — ABNORMAL HIGH (ref 1.7–7.7)
Neutrophils Relative %: 78 %
Platelets: 362 10*3/uL (ref 150–400)
RBC: 4.79 MIL/uL (ref 3.87–5.11)
RDW: 15.2 % (ref 11.5–15.5)
WBC: 12.9 10*3/uL — ABNORMAL HIGH (ref 4.0–10.5)
nRBC: 0.4 % — ABNORMAL HIGH (ref 0.0–0.2)

## 2019-04-20 LAB — COMPREHENSIVE METABOLIC PANEL
ALT: 25 U/L (ref 0–44)
AST: 32 U/L (ref 15–41)
Albumin: 3.5 g/dL (ref 3.5–5.0)
Alkaline Phosphatase: 76 U/L (ref 38–126)
Anion gap: 17 — ABNORMAL HIGH (ref 5–15)
BUN: 141 mg/dL — ABNORMAL HIGH (ref 8–23)
CO2: 33 mmol/L — ABNORMAL HIGH (ref 22–32)
Calcium: 10 mg/dL (ref 8.9–10.3)
Chloride: 95 mmol/L — ABNORMAL LOW (ref 98–111)
Creatinine, Ser: 2.41 mg/dL — ABNORMAL HIGH (ref 0.44–1.00)
GFR calc Af Amer: 21 mL/min — ABNORMAL LOW (ref >60)
GFR calc non Af Amer: 18 mL/min — ABNORMAL LOW (ref >60)
Glucose, Bld: 218 mg/dL — ABNORMAL HIGH (ref 70–99)
Potassium: 4.3 mmol/L (ref 3.5–5.1)
Sodium: 145 mmol/L (ref 135–145)
Total Bilirubin: 1.4 mg/dL — ABNORMAL HIGH (ref 0.3–1.2)
Total Protein: 6.4 g/dL — ABNORMAL LOW (ref 6.5–8.1)

## 2019-04-20 LAB — C-REACTIVE PROTEIN: CRP: 0.6 mg/dL (ref <1.0)

## 2019-04-20 LAB — BRAIN NATRIURETIC PEPTIDE: B Natriuretic Peptide: 74.2 pg/mL (ref 0.0–100.0)

## 2019-04-20 LAB — MAGNESIUM: Magnesium: 2.8 mg/dL — ABNORMAL HIGH (ref 1.7–2.4)

## 2019-04-20 LAB — D-DIMER, QUANTITATIVE: D-Dimer, Quant: 3.62 ug/mL-FEU — ABNORMAL HIGH (ref 0.00–0.50)

## 2019-04-20 MED ORDER — LORAZEPAM 0.5 MG PO TABS: 1.0000 mg | ORAL_TABLET | ORAL | Status: DC | PRN

## 2019-04-20 MED ORDER — HALOPERIDOL LACTATE 2 MG/ML PO CONC
0.5000 mg | ORAL | Status: DC | PRN
  Filled 2019-04-20: qty 0.3

## 2019-04-20 MED ORDER — ACETAMINOPHEN 325 MG PO TABS
650.0000 mg | ORAL_TABLET | Freq: Four times a day (QID) | ORAL | Status: DC | PRN
  Administered 2019-04-20: 650 mg via ORAL
  Filled 2019-04-20: qty 2

## 2019-04-20 MED ORDER — MORPHINE SULFATE (PF) 2 MG/ML IV SOLN
1.0000 mg | INTRAVENOUS | Status: DC | PRN
  Administered 2019-04-20 – 2019-04-22 (×6): 1 mg via INTRAVENOUS
  Filled 2019-04-20 (×6): qty 1

## 2019-04-20 MED ORDER — MORPHINE SULFATE (CONCENTRATE) 10 MG/0.5ML PO SOLN: 5.0000 mg | ORAL | Status: DC | PRN

## 2019-04-20 MED ORDER — ALUM & MAG HYDROXIDE-SIMETH 200-200-20 MG/5ML PO SUSP
15.0000 mL | Freq: Four times a day (QID) | ORAL | Status: DC | PRN
  Administered 2019-04-20 (×2): 15 mL via ORAL
  Filled 2019-04-20 (×3): qty 30

## 2019-04-20 MED ORDER — POLYVINYL ALCOHOL 1.4 % OP SOLN
1.0000 [drp] | Freq: Four times a day (QID) | OPHTHALMIC | Status: DC | PRN
  Filled 2019-04-20: qty 15

## 2019-04-20 MED ORDER — HALOPERIDOL LACTATE 5 MG/ML IJ SOLN: 0.5000 mg | INTRAMUSCULAR | Status: DC | PRN

## 2019-04-20 MED ORDER — DOCUSATE SODIUM 50 MG/5ML PO LIQD
100.0000 mg | Freq: Two times a day (BID) | ORAL | Status: DC | PRN
  Administered 2019-04-20: 100 mg via ORAL
  Filled 2019-04-20 (×2): qty 10

## 2019-04-20 MED ORDER — TRAZODONE HCL 50 MG PO TABS: 25.0000 mg | ORAL_TABLET | Freq: Every evening | ORAL | Status: DC | PRN

## 2019-04-20 MED ORDER — LORAZEPAM 2 MG/ML IJ SOLN
1.0000 mg | INTRAMUSCULAR | Status: DC | PRN
  Administered 2019-04-20 – 2019-04-22 (×3): 1 mg via INTRAVENOUS
  Filled 2019-04-20 (×3): qty 1

## 2019-04-20 MED ORDER — LORAZEPAM 2 MG/ML PO CONC: 1.0000 mg | ORAL | Status: DC | PRN

## 2019-04-20 MED ORDER — HALOPERIDOL 0.5 MG PO TABS
0.5000 mg | ORAL_TABLET | ORAL | Status: DC | PRN
  Filled 2019-04-20: qty 1

## 2019-04-20 NOTE — Progress Notes (Signed)
1430:   I was called in to room by patient.  After my turn, she said to me, " I just want to go home, I am so tired."  When I said that she was too sick to go home, she said home was "heaven."  I asked her if she was sure that is what she wanted and she nodded yes and said, "I want to die."  Dr. [facility] updated on discussion with patient and plan to update and discuss situation with daughter.  1500:  Daughter called for update for the day.  I updated her on discussion with patient and myself.  Plan for Dr. [facility] to call daughter to talk about plan and situation for patient.

## 2019-04-20 NOTE — Progress Notes (Signed)
Patient complaining of 8/10 RUQ pain radiating to LUQ.  Completed MD order for 12-lead EKG and GI cocktail.  Patient desaturating to 80% on 15 HFNC, transitioned to 15L NRB mask with good response, now 95%.  Patient drowsy but now in no acute distress.

## 2019-04-20 NOTE — Progress Notes (Signed)
PROGRESS NOTE                                                                                                                                                                                                             Patient Demographics:    Emily Andrews, is a 85 y.o. female, DOB - [birth year], RXY:[id number]  Outpatient Primary MD for the patient is Orpah Melter, MD    LOS - 10  Admit date - 04/21/2019    Chief Complaint  Patient presents with  . Shortness of Breath    Covid+       Brief Narrative   85 year old Caucasian female with past medical history significant for proximal atrial fibrillation, [place] vas 2 score of at least 3 on Coumadin, hypertension, hyperlipidemia and diabetes mellitus.  Apparently, patient tested positive for COVID-19 virus infection about 3 weeks ago, she presented with shortness of breath was diagnosed with acute hypoxic respiratory failure due to COVID-19 pneumonia and admitted to hospital.  She is with significant respiratory failure/ARDS, requiring 40 L/100% heated high flow at 1 point, she was treated with steroids, Actemra and remdesivir, had mild improvement of oxygen requirement, but overall she is very frail, weak, with very poor oral intake, multiple comorbidities, patient continues to have poor oral intake, extremely weak, discussion has been made with the family, decision has been made to proceed with comfort care.   Subjective:   Patient in bed, does report some abdominal discomfort, and dyspnea   Assessment  & Plan :   Acute Hypoxic Resp. Failure due to Acute Covid 19 Viral Pneumonitis during the ongoing 2020 Covid 19 Pandemic  - she has severe disease, significant oxygen requirement, she did require heated high flow 40 L / 100%, this has been improving, this morning she is tolerating 15 L high flow nasal cannula. -Treated with IV steroids, -Received remdesivir -Received Actemra  x2 -Was diuresed appropriately which she is currently euvolemic. -See discussion below regarding goals of care>    SpO2: 95 % O2 Flow Rate (L/min): 12 L/min FiO2 (%): 100 %   Recent Labs  Lab 04/14/19 0420 04/14/19 0420 04/15/19 0547 04/15/19 0547 04/16/19 0800 04/16/19 0840 04/17/19 0355 04/18/19 0341 04/19/19 0010 04/20/19 0111  CRP 4.4*   < > 2.6*   < >  --  1.4* 0.9 0.7 0.7 0.6  DDIMER 17.22*   < > 13.53*   < >  --  10.94* 9.36* 7.83* 5.99* 3.62*  BNP 162.4*   < > 232.6*   < >  --  102.8* 70.9 68.2 77.9 74.2  PROCALCITON 0.34  --  0.28  --  <0.10  --   --   --   --   --    < > = values in this interval not displayed.    Hepatic Function Latest Ref Rng & Units 04/20/2019 04/19/2019 04/18/2019  Total Protein 6.5 - 8.1 g/dL 6.4(L) 6.9 6.5  Albumin 3.5 - 5.0 g/dL 3.5 3.5 3.3(L)  AST 15 - 41 U/L 32 32 34  ALT 0 - 44 U/L '25 26 26  ' Alk Phosphatase 38 - 126 U/L 76 81 80  Total Bilirubin 0.3 - 1.2 mg/dL 1.4(H) 1.5(H) 2.0(H)  Bilirubin, Direct 0.0 - 0.3 mg/dL - - -     Acute on chronic diastolic CHF EF 93% on echocardiogram done in 2019 -was  on IV Lasix 80 mg IV twice daily, and Zaroxolyn, has stopped today given she appears dry, tachycardic with hypernatremia.  Hypokalemia -Repleted,   AKI  -could be ATN due to recent infection, was also on ARB.  - Renal ultrasound nonacute but does show chronic kidney disease.    Paroxysmal atrial fibrillation.   - [place] vas 2 score was greater than 3, she was on Coumadin > Eliquis > lovenox, and has been stopped currently -Is on flecainide  UTI. Finished Keflex for a total of 3 days.  Spike in D-dimer.  This happened after her vitamin K was reversed, question if she has thrown a clot, although she is on Eliquis will challenge her with a few days of Lovenox starting on 04/14/2019, she has also become quite short of breath after improvement on 04/14/2019.   Failure to thrive -Patient remains with poor appetite, started on Ensure, have  liberalized her diet to regular  Goals of care: -Patient is extremely frail, deconditioned, with significant failure to thrive, no oral intake, with increased work of breathing, significant discomfort from dyspnea, overall very poor prognosis in this patient with severe ARDS from COVID-19, continues to deteriorate, after discussion with the daughter, decision has been made to proceed with comfort care, for now we will start on palliative care order set.  Condition - Guarded +++  Family Communication  : Discussed with daughter via phone 1/16  Code Status :  DNR/comfort  Diet :   Diet Order            Diet regular Room service appropriate? Yes; Fluid consistency: Thin  Diet effective now               Disposition Plan  :  TBD  Consults  :  None  Procedures  :    Renal US - non acute  PUD Prophylaxis : PPI  DVT Prophylaxis  : None  Lab Results  Component Value Date   INR 2.0 (H) 04/12/2019   INR >10.0 (HH) 04/11/2019   INR >10.0 (HH) 04/09/2019     Lab Results  Component Value Date   PLT 362 04/20/2019    Inpatient Medications  Scheduled Meds: . feeding supplement (ENSURE ENLIVE)  237 mL Oral TID BM  . QUEtiapine  25 mg Oral BID   Continuous Infusions:  PRN Meds:.acetaminophen, ALPRAZolam, alum & mag hydroxide-simeth, bisacodyl, docusate, fluticasone, haloperidol **OR** haloperidol **OR** haloperidol lactate, LORazepam **OR** LORazepam **OR** LORazepam, morphine injection, morphine CONCENTRATE **OR**  morphine CONCENTRATE, ondansetron (ZOFRAN) IV, polyvinyl alcohol, sodium chloride, traZODone  Antibiotics  :    Anti-infectives (From admission, onward)   Start     Dose/Rate Route Frequency Ordered Stop   04/12/19 1000  remdesivir 100 mg in sodium chloride 0.9 % 100 mL IVPB  Status:  Discontinued     100 mg 200 mL/hr over 30 Minutes Intravenous Daily 04/11/19 0433 04/11/19 0446   04/12/19 1000  remdesivir 100 mg in sodium chloride 0.9 % 100 mL IVPB     100  mg 200 mL/hr over 30 Minutes Intravenous Daily 04/11/19 0447 04/15/19 1936   04/12/19 0800  cephALEXin (KEFLEX) capsule 250 mg     250 mg Oral Daily 04/12/19 0753 04/16/19 0802   04/12/19 0745  cephALEXin (KEFLEX) capsule 500 mg  Status:  Discontinued     500 mg Oral Every 12 hours 04/12/19 0736 04/12/19 0751   04/11/19 1000  remdesivir 100 mg in sodium chloride 0.9 % 100 mL IVPB  Status:  Discontinued     100 mg 200 mL/hr over 30 Minutes Intravenous Daily 04/07/2019 2234 04/11/19 0447   04/11/19 0500  azithromycin (ZITHROMAX) 500 mg in sodium chloride 0.9 % 250 mL IVPB  Status:  Discontinued     500 mg 250 mL/hr over 60 Minutes Intravenous Daily 04/11/19 0433 04/11/19 0734   04/11/19 0500  cefTRIAXone (ROCEPHIN) 1 g in sodium chloride 0.9 % 100 mL IVPB  Status:  Discontinued     1 g 200 mL/hr over 30 Minutes Intravenous Daily 04/11/19 0433 04/11/19 0734   04/11/19 0445  remdesivir 200 mg in sodium chloride 0.9% 250 mL IVPB  Status:  Discontinued     200 mg 580 mL/hr over 30 Minutes Intravenous Once 04/11/19 0433 04/11/19 0551   04/24/2019 2245  remdesivir 200 mg in sodium chloride 0.9% 250 mL IVPB     200 mg 580 mL/hr over 30 Minutes Intravenous Once 04/09/2019 2234 04/11/19 0618       Time Spent in minutes  51   Phillips Climes M.D on 04/20/2019 at 4:56 PM  To page go to [email or web address] - password [facility]  [facility] -  Office  [phone]    See all Orders from today for further details    Objective:   Vitals:   04/20/19 1200 04/20/19 1500 04/20/19 1600 04/20/19 1630  BP: 116/63 114/75 116/61 (!) 129/92  Pulse: (!) 112 (!) 106 (!) 106 (!) 113  Resp: (!) 26 (!) 28 (!) 24 (!) 22  Temp: 98 F (36.7 C)   (!) 96.4 F (35.8 C)  TempSrc: Axillary   Axillary  SpO2: 94% 96% 95% 95%  Weight:      Height:        Wt Readings from Last 3 Encounters:  04/13/2019 73.5 kg  03/29/19 73.5 kg  02/04/19 75.1 kg     Intake/Output Summary (Last 24 hours) at 04/20/2019 1656 Last  data filed at 04/20/2019 0900 Gross per 24 hour  Intake 700 ml  Output 825 ml  Net -125 ml     Physical Exam  Is more sleepy today, less communicative and alert overall extremely frail, with severe deconditioning, but appears comfortable Symmetrical Chest wall movement, diminished air entry, with some increased work of breathing RRR,No Gallops,Rubs or new Murmurs, No Parasternal Heave +ve B.Sounds, Abd Soft, No tenderness, No rebound - guarding or rigidity. No Cyanosis, Clubbing or edema,      Data Review:    CBC Recent Labs  Lab 04/15/19  6808 04/15/19 0547 04/16/19 0840 04/17/19 0355 04/18/19 0341 04/19/19 0010 04/20/19 0111  WBC 9.8   < > 8.6 8.2 8.8 9.8 12.9*  HGB 13.4   < > 13.7 14.1 14.0 14.7 14.7  HCT 40.8   < > 42.0 43.6 43.8 46.1* 44.8  PLT 279   < > 300 321 345 372 362  MCV 92.7   < > 92.5 92.8 94.0 94.3 93.5  MCH 30.5   < > 30.2 30.0 30.0 30.1 30.7  MCHC 32.8   < > 32.6 32.3 32.0 31.9 32.8  RDW 14.9   < > 14.7 14.9 14.7 15.0 15.2  LYMPHSABS 1.2  --   --  1.3 1.6 1.3 1.6  MONOABS 0.4  --   --  0.3 0.6 0.6 1.0  EOSABS 0.0  --   --  0.1 0.0 0.0 0.0  BASOSABS 0.0  --   --  0.0 0.0 0.0 0.1   < > = values in this interval not displayed.    Chemistries  Recent Labs  Lab 04/15/19 0547 04/15/19 0547 04/16/19 0840 04/17/19 0355 04/18/19 0341 04/19/19 0010 04/20/19 0111  NA 142   < > 143 145 145 147* 145  K 4.4   < > 3.8 3.9 3.4* 3.7 4.3  CL 99   < > 98 95* 94* 94* 95*  CO2 29   < > 29 33* 35* 34* 33*  GLUCOSE 153*   < > 147* 165* 182* 224* 218*  BUN 104*   < > 99* 108* 96* 109* 141*  CREATININE 2.54*   < > 2.21* 2.08* 2.14* 2.09* 2.41*  CALCIUM 8.8*   < > 9.0 9.1 9.4 9.9 10.0  MG 2.2   < > 2.0 2.1 2.3 2.5* 2.8*  AST 46*  --   --  35 34 32 32  ALT 36  --   --  '26 26 26 25  ' ALKPHOS 89  --   --  83 80 81 76  BILITOT 0.9  --   --  1.7* 2.0* 1.5* 1.4*   < > = values in this interval not displayed.    ------------------------------------------------------------------------------------------------------------------ No results for input(s): CHOL, HDL, LDLCALC, TRIG, CHOLHDL, LDLDIRECT in the last 72 hours.  Lab Results  Component Value Date   HGBA1C 5.5 03/23/2018   ------------------------------------------------------------------------------------------------------------------ No results for input(s): TSH, T4TOTAL, T3FREE, THYROIDAB in the last 72 hours.  Invalid input(s): FREET3  Cardiac Enzymes No results for input(s): CKMB, TROPONINI, MYOGLOBIN in the last 168 hours.  Invalid input(s): CK ------------------------------------------------------------------------------------------------------------------    Component Value Date/Time   BNP 74.2 04/20/2019 0111    Micro Results Recent Results (from the past 240 hour(s))  Blood Culture (routine x 2)     Status: None   Collection Time: 04/06/2019  5:13 PM   Specimen: BLOOD RIGHT WRIST  Result Value Ref Range Status   Specimen Description BLOOD RIGHT WRIST  Final   Special Requests   Final    BOTTLES DRAWN AEROBIC AND ANAEROBIC Blood Culture results may not be optimal due to an inadequate volume of blood received in culture bottles   Culture   Final    NO GROWTH 5 DAYS Performed at [facility], [place] [address], [place], [place] [address]    Report Status 04/15/2019 FINAL  Final  Blood Culture (routine x 2)     Status: None   Collection Time: 05/02/2019  5:13 PM   Specimen: BLOOD RIGHT ARM  Result Value Ref  Range Status   Specimen Description BLOOD RIGHT ARM  Final   Special Requests AEROBIC BOTTLE ONLY Blood Culture adequate volume  Final   Culture   Final    NO GROWTH 5 DAYS Performed at [facility], [address], [place], [place] [address]    Report Status 04/15/2019 FINAL  Final  SARS CORONAVIRUS 2 (TAT 6-24 HRS) Nasopharyngeal Nasopharyngeal Swab     Status: Abnormal   Collection Time: 04/05/2019  7:04  PM   Specimen: Nasopharyngeal Swab  Result Value Ref Range Status   SARS Coronavirus 2 POSITIVE (A) NEGATIVE Final    Comment: RESULT CALLED TO, READ BACK BY AND VERIFIED WITH: B.BECK,RN [id number] 04/11/19 G.MCADOO (NOTE) SARS-CoV-2 target nucleic acids are DETECTED. The SARS-CoV-2 RNA is generally detectable in upper and lower respiratory specimens during the acute phase of infection. Positive results are indicative of the presence of SARS-CoV-2 RNA. Clinical correlation with patient history and other diagnostic information is  necessary to determine patient infection status. Positive results do not rule out bacterial infection or co-infection with other viruses.  The expected result is Negative. Fact Sheet for Patients: SugarRoll.be Fact Sheet for Healthcare Providers: https://www.woods-mathews.com/ This test is not yet approved or cleared by the [place] FDA and  has been authorized for detection and/or diagnosis of SARS-CoV-2 by FDA under an Emergency Use Authorization (EUA). This EUA will remain  in effect (meaning this test can be used) for the dura tion of the COVID-19 declaration under Section 564(b)(1) of the Act, 21 U.S.C. section 360bbb-3(b)(1), unless the authorization is terminated or revoked sooner. Performed at [facility], [place] [address], [place], [place] [address]     Radiology Reports CT Head Wo Contrast  Result Date: 03/29/2019 CLINICAL DATA:  Poly trauma, fell this evening striking head on floor; past history of breast cancer, hypertension, diabetes mellitus, paroxysmal atrial fibrillation EXAM: CT HEAD WITHOUT CONTRAST CT CERVICAL SPINE WITHOUT CONTRAST TECHNIQUE: Multidetector CT imaging of the head and cervical spine was performed following the standard protocol without intravenous contrast. Multiplanar CT image reconstructions of the cervical spine were also generated. COMPARISON:  CT head 03/09/2018, MRI cervical  spine 09/10/2007 FINDINGS: CT HEAD FINDINGS Brain: Generalized atrophy. Normal ventricular morphology. No midline shift or mass effect. Mild small vessel chronic ischemic changes of deep cerebral white matter. Streak artifacts at temporal lobes due to motion. Low-attenuation at LEFT occipital lobe, by sagittal images likely related to same artifacts. No intracranial hemorrhage, mass lesion or definite acute infarction. No extra-axial fluid collections. Vascular: Atherosclerotic calcifications of internal carotid arteries at skull base. Skull: Calvaria intact.  Scattered motion artifacts noted. Sinuses/Orbits: Clear Other: N/A CT CERVICAL SPINE FINDINGS Alignment: Anterolisthesis at C3-C4, unchanged. Remaining alignments normal. Skull base and vertebrae: Osseous demineralization. Skull base intact. Multilevel facet degenerative changes. Multilevel degenerative disc disease changes. Vertebral body heights maintained without fracture or bone destruction. Ankylosis of C4-C5 facet joints. Encroachment upon C3-C4 and C5-C6 neural foramina bilaterally as well as RIGHT C6-C7. Soft tissues and spinal canal: Prevertebral soft tissues normal thickness. Atherosclerotic calcifications within the carotid systems bilaterally. BILATERAL carotid bifurcations extend retropharyngeal. Additional atherosclerotic calcifications at aortic arch. Disc levels:  No additional abnormalities Upper chest: Scarring at RIGHT apex. Other: N/A IMPRESSION: Atrophy with small vessel chronic ischemic changes of deep cerebral white matter. No acute intracranial abnormalities identified on exam limited by mild patient motion artifacts. Degenerative disc and facet disease changes of the cervical spine. No acute cervical spine abnormalities. Electronically Signed  By: Lavonia Dana M.D.   On: 03/29/2019 21:05   CT Cervical Spine Wo Contrast  Result Date: 03/29/2019 CLINICAL DATA:  Poly trauma, fell this evening striking head on floor; past history of  breast cancer, hypertension, diabetes mellitus, paroxysmal atrial fibrillation EXAM: CT HEAD WITHOUT CONTRAST CT CERVICAL SPINE WITHOUT CONTRAST TECHNIQUE: Multidetector CT imaging of the head and cervical spine was performed following the standard protocol without intravenous contrast. Multiplanar CT image reconstructions of the cervical spine were also generated. COMPARISON:  CT head 03/09/2018, MRI cervical spine 09/10/2007 FINDINGS: CT HEAD FINDINGS Brain: Generalized atrophy. Normal ventricular morphology. No midline shift or mass effect. Mild small vessel chronic ischemic changes of deep cerebral white matter. Streak artifacts at temporal lobes due to motion. Low-attenuation at LEFT occipital lobe, by sagittal images likely related to same artifacts. No intracranial hemorrhage, mass lesion or definite acute infarction. No extra-axial fluid collections. Vascular: Atherosclerotic calcifications of internal carotid arteries at skull base. Skull: Calvaria intact.  Scattered motion artifacts noted. Sinuses/Orbits: Clear Other: N/A CT CERVICAL SPINE FINDINGS Alignment: Anterolisthesis at C3-C4, unchanged. Remaining alignments normal. Skull base and vertebrae: Osseous demineralization. Skull base intact. Multilevel facet degenerative changes. Multilevel degenerative disc disease changes. Vertebral body heights maintained without fracture or bone destruction. Ankylosis of C4-C5 facet joints. Encroachment upon C3-C4 and C5-C6 neural foramina bilaterally as well as RIGHT C6-C7. Soft tissues and spinal canal: Prevertebral soft tissues normal thickness. Atherosclerotic calcifications within the carotid systems bilaterally. BILATERAL carotid bifurcations extend retropharyngeal. Additional atherosclerotic calcifications at aortic arch. Disc levels:  No additional abnormalities Upper chest: Scarring at RIGHT apex. Other: N/A IMPRESSION: Atrophy with small vessel chronic ischemic changes of deep cerebral white matter. No  acute intracranial abnormalities identified on exam limited by mild patient motion artifacts. Degenerative disc and facet disease changes of the cervical spine. No acute cervical spine abnormalities. Electronically Signed   By: Lavonia Dana M.D.   On: 03/29/2019 21:05   US Renal  Result Date: 04/14/2019 CLINICAL DATA:  Acute kidney injury EXAM: RENAL / URINARY TRACT ULTRASOUND COMPLETE COMPARISON:  None. FINDINGS: Right Kidney: Renal measurements: 7.9 x 4.2 x 3.9 cm = volume: 65.8 mL. There is increased cortical echogenicity. There is no hydronephrosis. Left Kidney: Renal measurements: 8.7 x 4.7 x 3.6 cm = volume: 77 mL. There is increased cortical echogenicity. There is no hydronephrosis. Bladder: Appears normal for degree of bladder distention. Other: None. IMPRESSION: 1. No acute abnormality. 2. Echogenic kidneys bilaterally which can be seen in patients with medical renal disease. Electronically Signed   By: Constance Holster M.D.   On: 04/09/2019 23:41   DG Chest Port 1 View  Result Date: 04/16/2019 CLINICAL DATA:  Shortness of breath COVID-19 pneumonia. EXAM: PORTABLE CHEST 1 VIEW COMPARISON:  One-view chest x-ray 04/14/2019 FINDINGS: Heart size is stable. Atherosclerotic calcifications present at the aortic arch. Progressive interstitial and airspace opacities are present in both lungs, consistent with worsening pneumonia. There is new gaseous distention of the stomach. IMPRESSION: 1. Progressive interstitial and airspace disease in both lungs compatible with worsening pneumonia. 2. New gaseous distention of the stomach. 3. Aortic atherosclerosis. Electronically Signed   By: San Morelle M.D.   On: 04/16/2019 08:45   DG CHEST PORT 1 VIEW  Result Date: 04/15/2019 CLINICAL DATA:  Oxygen desaturation. ARDS. COVID-19 virus infection. EXAM: PORTABLE CHEST 1 VIEW COMPARISON:  04/14/2019 FINDINGS: Heart size is stable. Diffuse mixed interstitial and airspace opacity throughout both lungs shows no  significant change. No pneumothorax or pleural effusion  visualized. IMPRESSION: No significant change in diffuse mixed interstitial and airspace opacity throughout both lungs. Electronically Signed   By: Marlaine Hind M.D.   On: 04/15/2019 08:06   DG Chest Port 1 View  Result Date: 04/14/2019 CLINICAL DATA:  COVID-19 positive, dyspnea EXAM: PORTABLE CHEST 1 VIEW COMPARISON:  05/01/2019 chest radiograph. FINDINGS: Stable cardiomediastinal silhouette with normal heart size. No pneumothorax. No pleural effusion. Extensive patchy opacities throughout both lungs appear worsened. IMPRESSION: Worsening extensive patchy opacities throughout both lungs, compatible with COVID-19 pneumonia. Electronically Signed   By: Ilona Sorrel M.D.   On: 04/14/2019 11:44   DG Chest Portable 1 View  Result Date: 04/27/2019 CLINICAL DATA:  Hypoxia. EXAM: PORTABLE CHEST 1 VIEW COMPARISON:  01/01/2019 FINDINGS: There are diffuse bilateral hazy ground-glass airspace opacities, greatest within the left lung zone. The heart size is borderline enlarged. Aortic calcifications are noted. There is no pneumothorax or large pleural effusion. There is no acute osseous abnormality. IMPRESSION: Diffuse bilateral ground-glass airspace opacities consistent with the patient's history of viral pneumonia. Electronically Signed   By: Constance Holster M.D.   On: 04/12/2019 16:26

## 2019-04-20 NOTE — Progress Notes (Signed)
Called and updated patient's Daughter Lattie Haw on patient's status. Daughter stated that she nor her father wish to do an in person visit but did inquire over video call on android device, charge nurse was called and informed.

## 2019-04-21 NOTE — Progress Notes (Signed)
PROGRESS NOTE                                                                                                                                                                                                             Patient Demographics:    Emily Andrews, is a 85 y.o. female, DOB - [birth year], FGH:[id number]  Outpatient Primary MD for the patient is Orpah Melter, MD    LOS - 11  Admit date - 04/17/2019    Chief Complaint  Patient presents with  . Shortness of Breath    Covid+       Brief Narrative   85 year old Caucasian female with past medical history significant for proximal atrial fibrillation, [place] vas 2 score of at least 3 on Coumadin, hypertension, hyperlipidemia and diabetes mellitus.  Apparently, patient tested positive for COVID-19 virus infection about 3 weeks ago, she presented with shortness of breath was diagnosed with acute hypoxic respiratory failure due to COVID-19 pneumonia and admitted to hospital.  She is with significant respiratory failure/ARDS, requiring 40 L/100% heated high flow at 1 point, she was treated with steroids, Actemra and remdesivir, had mild improvement of oxygen requirement, but overall she is very frail, weak, with very poor oral intake, multiple comorbidities, patient continues to have poor oral intake, extremely weak, discussion has been made with the family, decision has been made to proceed with comfort care.   Subjective:   Patient not provide any complaints, obtundent, no significant events as discussed with staff   Assessment  & Plan :   Acute Hypoxic Resp. Failure due to Acute Covid 19 Viral Pneumonitis during the ongoing 2020 Covid 19 Pandemic  - she has severe disease, significant oxygen requirement, she did require heated high flow 40 L / 100%, her oxygen requirement has improved since, recent was 15 L before she was transitioned to comfort care. -Treated with IV  steroids, -Received remdesivir -Received Actemra x2 -Was diuresed appropriately which she is currently euvolemic. -See discussion below regarding goals of care>    SpO2: 91 % O2 Flow Rate (L/min): 10 L/min FiO2 (%): 100 %   Recent Labs  Lab 04/15/19 0547 04/15/19 0547 04/16/19 0800 04/16/19 0840 04/17/19 0355 04/18/19 0341 04/19/19 0010 04/20/19 0111  CRP 2.6*   < >  --  1.4* 0.9 0.7 0.7 0.6  DDIMER 13.53*   < >  --  10.94* 9.36* 7.83* 5.99* 3.62*  BNP 232.6*   < >  --  102.8* 70.9 68.2 77.9 74.2  PROCALCITON 0.28  --  <0.10  --   --   --   --   --    < > = values in this interval not displayed.    Hepatic Function Latest Ref Rng & Units 04/20/2019 04/19/2019 04/18/2019  Total Protein 6.5 - 8.1 g/dL 6.4(L) 6.9 6.5  Albumin 3.5 - 5.0 g/dL 3.5 3.5 3.3(L)  AST 15 - 41 U/L 32 32 34  ALT 0 - 44 U/L '25 26 26  ' Alk Phosphatase 38 - 126 U/L 76 81 80  Total Bilirubin 0.3 - 1.2 mg/dL 1.4(H) 1.5(H) 2.0(H)  Bilirubin, Direct 0.0 - 0.3 mg/dL - - -     Acute on chronic diastolic CHF EF 67% on echocardiogram done in 2019 -was  on IV Lasix 80 mg IV twice daily, and Zaroxolyn, has stopped today given she appears dry, tachycardic with hypernatremia.  Hypokalemia -Repleted,   AKI  -could be ATN due to recent infection, was also on ARB.  - Renal ultrasound nonacute but does show chronic kidney disease.    Paroxysmal atrial fibrillation.   - [place] vas 2 score was greater than 3, she was on Coumadin > Eliquis > lovenox, and has been stopped currently -Is on flecainide  UTI. Finished Keflex for a total of 3 days.  Spike in D-dimer.  This happened after her vitamin K was reversed, question if she has thrown a clot, although she is on Eliquis will challenge her with a few days of Lovenox starting on 04/14/2019, she has also become quite short of breath after improvement on 04/14/2019.   Failure to thrive -Patient remains with poor appetite, started on Ensure, have liberalized her diet to  regular  Goals of care: -Patient is extremely frail, deconditioned, with significant failure to thrive, no oral intake, with increased work of breathing, significant discomfort from dyspnea, overall very poor prognosis in this patient with severe ARDS from COVID-19, continues to deteriorate, after discussion with the daughter, decision has been made to proceed with comfort care, for now we will start on palliative care order set.  Condition - Guarded +++  Family Communication  : Discussed with daughter via phone .  Code Status :  DNR/comfort  Diet :   Diet Order            Diet regular Room service appropriate? Yes; Fluid consistency: Thin  Diet effective now               Disposition Plan  :  TBD  Consults  :  None  Procedures  :    Renal US - non acute  PUD Prophylaxis : PPI  DVT Prophylaxis  : None  Lab Results  Component Value Date   INR 2.0 (H) 04/12/2019   INR >10.0 (HH) 04/11/2019   INR >10.0 (HH) 04/12/2019     Lab Results  Component Value Date   PLT 362 04/20/2019    Inpatient Medications  Scheduled Meds: . feeding supplement (ENSURE ENLIVE)  237 mL Oral TID BM  . QUEtiapine  25 mg Oral BID   Continuous Infusions:  PRN Meds:.acetaminophen, ALPRAZolam, alum & mag hydroxide-simeth, bisacodyl, docusate, fluticasone, haloperidol **OR** haloperidol **OR** haloperidol lactate, LORazepam **OR** LORazepam **OR** LORazepam, morphine injection, morphine CONCENTRATE **OR** morphine CONCENTRATE, ondansetron (ZOFRAN) IV, polyvinyl alcohol, sodium chloride, traZODone  Antibiotics  :    Anti-infectives (From admission, onward)  Start     Dose/Rate Route Frequency Ordered Stop   04/12/19 1000  remdesivir 100 mg in sodium chloride 0.9 % 100 mL IVPB  Status:  Discontinued     100 mg 200 mL/hr over 30 Minutes Intravenous Daily 04/11/19 0433 04/11/19 0446   04/12/19 1000  remdesivir 100 mg in sodium chloride 0.9 % 100 mL IVPB     100 mg 200 mL/hr over 30  Minutes Intravenous Daily 04/11/19 0447 04/15/19 1936   04/12/19 0800  cephALEXin (KEFLEX) capsule 250 mg     250 mg Oral Daily 04/12/19 0753 04/16/19 0802   04/12/19 0745  cephALEXin (KEFLEX) capsule 500 mg  Status:  Discontinued     500 mg Oral Every 12 hours 04/12/19 0736 04/12/19 0751   04/11/19 1000  remdesivir 100 mg in sodium chloride 0.9 % 100 mL IVPB  Status:  Discontinued     100 mg 200 mL/hr over 30 Minutes Intravenous Daily 04/09/2019 2234 04/11/19 0447   04/11/19 0500  azithromycin (ZITHROMAX) 500 mg in sodium chloride 0.9 % 250 mL IVPB  Status:  Discontinued     500 mg 250 mL/hr over 60 Minutes Intravenous Daily 04/11/19 0433 04/11/19 0734   04/11/19 0500  cefTRIAXone (ROCEPHIN) 1 g in sodium chloride 0.9 % 100 mL IVPB  Status:  Discontinued     1 g 200 mL/hr over 30 Minutes Intravenous Daily 04/11/19 0433 04/11/19 0734   04/11/19 0445  remdesivir 200 mg in sodium chloride 0.9% 250 mL IVPB  Status:  Discontinued     200 mg 580 mL/hr over 30 Minutes Intravenous Once 04/11/19 0433 04/11/19 0551   04/24/2019 2245  remdesivir 200 mg in sodium chloride 0.9% 250 mL IVPB     200 mg 580 mL/hr over 30 Minutes Intravenous Once 04/15/2019 2234 04/11/19 0618       Time Spent in minutes  25   Phillips Climes M.D on 04/21/2019 at 3:49 PM  To page go to [email or web address] - password [email or web address]  [facility] -  Office  [phone]    See all Orders from today for further details    Objective:   Vitals:   04/20/19 1600 04/20/19 1630 04/20/19 2100 04/21/19 0800  BP: 116/61 (!) 129/92 (!) 143/124   Pulse: (!) 106 (!) 113 (!) 102 (!) 109  Resp: (!) 24 (!) 22 (!) 28 (!) 25  Temp:  (!) 96.4 F (35.8 C)    TempSrc:  Axillary    SpO2: 95% 95% 94% 91%  Weight:      Height:        Wt Readings from Last 3 Encounters:  04/25/2019 73.5 kg  03/29/19 73.5 kg  02/04/19 75.1 kg    No intake or output data in the 24 hours ending 04/21/19 1549   Physical Exam  Is obtundent, normally  responsive to verbal stimuli, appears comfortable  Symmetrical chest wall movement  Abdomen soft, nontender  Remedies with no clubbing or cyanosis .   Data Review:    CBC Recent Labs  Lab 04/15/19 0547 04/15/19 0547 04/16/19 0840 04/17/19 0355 04/18/19 0341 04/19/19 0010 04/20/19 0111  WBC 9.8   < > 8.6 8.2 8.8 9.8 12.9*  HGB 13.4   < > 13.7 14.1 14.0 14.7 14.7  HCT 40.8   < > 42.0 43.6 43.8 46.1* 44.8  PLT 279   < > 300 321 345 372 362  MCV 92.7   < > 92.5 92.8 94.0 94.3 93.5  MCH 30.5   < >  30.2 30.0 30.0 30.1 30.7  MCHC 32.8   < > 32.6 32.3 32.0 31.9 32.8  RDW 14.9   < > 14.7 14.9 14.7 15.0 15.2  LYMPHSABS 1.2  --   --  1.3 1.6 1.3 1.6  MONOABS 0.4  --   --  0.3 0.6 0.6 1.0  EOSABS 0.0  --   --  0.1 0.0 0.0 0.0  BASOSABS 0.0  --   --  0.0 0.0 0.0 0.1   < > = values in this interval not displayed.    Chemistries  Recent Labs  Lab 04/15/19 0547 04/15/19 0547 04/16/19 0840 04/17/19 0355 04/18/19 0341 04/19/19 0010 04/20/19 0111  NA 142   < > 143 145 145 147* 145  K 4.4   < > 3.8 3.9 3.4* 3.7 4.3  CL 99   < > 98 95* 94* 94* 95*  CO2 29   < > 29 33* 35* 34* 33*  GLUCOSE 153*   < > 147* 165* 182* 224* 218*  BUN 104*   < > 99* 108* 96* 109* 141*  CREATININE 2.54*   < > 2.21* 2.08* 2.14* 2.09* 2.41*  CALCIUM 8.8*   < > 9.0 9.1 9.4 9.9 10.0  MG 2.2   < > 2.0 2.1 2.3 2.5* 2.8*  AST 46*  --   --  35 34 32 32  ALT 36  --   --  '26 26 26 25  ' ALKPHOS 89  --   --  83 80 81 76  BILITOT 0.9  --   --  1.7* 2.0* 1.5* 1.4*   < > = values in this interval not displayed.   ------------------------------------------------------------------------------------------------------------------ No results for input(s): CHOL, HDL, LDLCALC, TRIG, CHOLHDL, LDLDIRECT in the last 72 hours.  Lab Results  Component Value Date   HGBA1C 5.5 03/23/2018   ------------------------------------------------------------------------------------------------------------------ No results for  input(s): TSH, T4TOTAL, T3FREE, THYROIDAB in the last 72 hours.  Invalid input(s): FREET3  Cardiac Enzymes No results for input(s): CKMB, TROPONINI, MYOGLOBIN in the last 168 hours.  Invalid input(s): CK ------------------------------------------------------------------------------------------------------------------    Component Value Date/Time   BNP 74.2 04/20/2019 0111    Micro Results No results found for this or any previous visit (from the past 240 hour(s)).  Radiology Reports CT Head Wo Contrast  Result Date: 03/29/2019 CLINICAL DATA:  Poly trauma, fell this evening striking head on floor; past history of breast cancer, hypertension, diabetes mellitus, paroxysmal atrial fibrillation EXAM: CT HEAD WITHOUT CONTRAST CT CERVICAL SPINE WITHOUT CONTRAST TECHNIQUE: Multidetector CT imaging of the head and cervical spine was performed following the standard protocol without intravenous contrast. Multiplanar CT image reconstructions of the cervical spine were also generated. COMPARISON:  CT head 03/09/2018, MRI cervical spine 09/10/2007 FINDINGS: CT HEAD FINDINGS Brain: Generalized atrophy. Normal ventricular morphology. No midline shift or mass effect. Mild small vessel chronic ischemic changes of deep cerebral white matter. Streak artifacts at temporal lobes due to motion. Low-attenuation at LEFT occipital lobe, by sagittal images likely related to same artifacts. No intracranial hemorrhage, mass lesion or definite acute infarction. No extra-axial fluid collections. Vascular: Atherosclerotic calcifications of internal carotid arteries at skull base. Skull: Calvaria intact.  Scattered motion artifacts noted. Sinuses/Orbits: Clear Other: N/A CT CERVICAL SPINE FINDINGS Alignment: Anterolisthesis at C3-C4, unchanged. Remaining alignments normal. Skull base and vertebrae: Osseous demineralization. Skull base intact. Multilevel facet degenerative changes. Multilevel degenerative disc disease changes.  Vertebral body heights maintained without fracture or bone destruction. Ankylosis of C4-C5 facet joints. Encroachment  upon C3-C4 and C5-C6 neural foramina bilaterally as well as RIGHT C6-C7. Soft tissues and spinal canal: Prevertebral soft tissues normal thickness. Atherosclerotic calcifications within the carotid systems bilaterally. BILATERAL carotid bifurcations extend retropharyngeal. Additional atherosclerotic calcifications at aortic arch. Disc levels:  No additional abnormalities Upper chest: Scarring at RIGHT apex. Other: N/A IMPRESSION: Atrophy with small vessel chronic ischemic changes of deep cerebral white matter. No acute intracranial abnormalities identified on exam limited by mild patient motion artifacts. Degenerative disc and facet disease changes of the cervical spine. No acute cervical spine abnormalities. Electronically Signed   By: Lavonia Dana M.D.   On: 03/29/2019 21:05   CT Cervical Spine Wo Contrast  Result Date: 03/29/2019 CLINICAL DATA:  Poly trauma, fell this evening striking head on floor; past history of breast cancer, hypertension, diabetes mellitus, paroxysmal atrial fibrillation EXAM: CT HEAD WITHOUT CONTRAST CT CERVICAL SPINE WITHOUT CONTRAST TECHNIQUE: Multidetector CT imaging of the head and cervical spine was performed following the standard protocol without intravenous contrast. Multiplanar CT image reconstructions of the cervical spine were also generated. COMPARISON:  CT head 03/09/2018, MRI cervical spine 09/10/2007 FINDINGS: CT HEAD FINDINGS Brain: Generalized atrophy. Normal ventricular morphology. No midline shift or mass effect. Mild small vessel chronic ischemic changes of deep cerebral white matter. Streak artifacts at temporal lobes due to motion. Low-attenuation at LEFT occipital lobe, by sagittal images likely related to same artifacts. No intracranial hemorrhage, mass lesion or definite acute infarction. No extra-axial fluid collections. Vascular:  Atherosclerotic calcifications of internal carotid arteries at skull base. Skull: Calvaria intact.  Scattered motion artifacts noted. Sinuses/Orbits: Clear Other: N/A CT CERVICAL SPINE FINDINGS Alignment: Anterolisthesis at C3-C4, unchanged. Remaining alignments normal. Skull base and vertebrae: Osseous demineralization. Skull base intact. Multilevel facet degenerative changes. Multilevel degenerative disc disease changes. Vertebral body heights maintained without fracture or bone destruction. Ankylosis of C4-C5 facet joints. Encroachment upon C3-C4 and C5-C6 neural foramina bilaterally as well as RIGHT C6-C7. Soft tissues and spinal canal: Prevertebral soft tissues normal thickness. Atherosclerotic calcifications within the carotid systems bilaterally. BILATERAL carotid bifurcations extend retropharyngeal. Additional atherosclerotic calcifications at aortic arch. Disc levels:  No additional abnormalities Upper chest: Scarring at RIGHT apex. Other: N/A IMPRESSION: Atrophy with small vessel chronic ischemic changes of deep cerebral white matter. No acute intracranial abnormalities identified on exam limited by mild patient motion artifacts. Degenerative disc and facet disease changes of the cervical spine. No acute cervical spine abnormalities. Electronically Signed   By: Lavonia Dana M.D.   On: 03/29/2019 21:05   US Renal  Result Date: 04/09/2019 CLINICAL DATA:  Acute kidney injury EXAM: RENAL / URINARY TRACT ULTRASOUND COMPLETE COMPARISON:  None. FINDINGS: Right Kidney: Renal measurements: 7.9 x 4.2 x 3.9 cm = volume: 65.8 mL. There is increased cortical echogenicity. There is no hydronephrosis. Left Kidney: Renal measurements: 8.7 x 4.7 x 3.6 cm = volume: 77 mL. There is increased cortical echogenicity. There is no hydronephrosis. Bladder: Appears normal for degree of bladder distention. Other: None. IMPRESSION: 1. No acute abnormality. 2. Echogenic kidneys bilaterally which can be seen in patients with medical  renal disease. Electronically Signed   By: Constance Holster M.D.   On: 04/30/2019 23:41   DG Chest Port 1 View  Result Date: 04/16/2019 CLINICAL DATA:  Shortness of breath COVID-19 pneumonia. EXAM: PORTABLE CHEST 1 VIEW COMPARISON:  One-view chest x-ray 04/14/2019 FINDINGS: Heart size is stable. Atherosclerotic calcifications present at the aortic arch. Progressive interstitial and airspace opacities are present in both lungs, consistent with worsening  pneumonia. There is new gaseous distention of the stomach. IMPRESSION: 1. Progressive interstitial and airspace disease in both lungs compatible with worsening pneumonia. 2. New gaseous distention of the stomach. 3. Aortic atherosclerosis. Electronically Signed   By: San Morelle M.D.   On: 04/16/2019 08:45   DG CHEST PORT 1 VIEW  Result Date: 04/15/2019 CLINICAL DATA:  Oxygen desaturation. ARDS. COVID-19 virus infection. EXAM: PORTABLE CHEST 1 VIEW COMPARISON:  04/14/2019 FINDINGS: Heart size is stable. Diffuse mixed interstitial and airspace opacity throughout both lungs shows no significant change. No pneumothorax or pleural effusion visualized. IMPRESSION: No significant change in diffuse mixed interstitial and airspace opacity throughout both lungs. Electronically Signed   By: Marlaine Hind M.D.   On: 04/15/2019 08:06   DG Chest Port 1 View  Result Date: 04/14/2019 CLINICAL DATA:  COVID-19 positive, dyspnea EXAM: PORTABLE CHEST 1 VIEW COMPARISON:  04/27/2019 chest radiograph. FINDINGS: Stable cardiomediastinal silhouette with normal heart size. No pneumothorax. No pleural effusion. Extensive patchy opacities throughout both lungs appear worsened. IMPRESSION: Worsening extensive patchy opacities throughout both lungs, compatible with COVID-19 pneumonia. Electronically Signed   By: Ilona Sorrel M.D.   On: 04/14/2019 11:44   DG Chest Portable 1 View  Result Date: 04/24/2019 CLINICAL DATA:  Hypoxia. EXAM: PORTABLE CHEST 1 VIEW COMPARISON:   01/01/2019 FINDINGS: There are diffuse bilateral hazy ground-glass airspace opacities, greatest within the left lung zone. The heart size is borderline enlarged. Aortic calcifications are noted. There is no pneumothorax or large pleural effusion. There is no acute osseous abnormality. IMPRESSION: Diffuse bilateral ground-glass airspace opacities consistent with the patient's history of viral pneumonia. Electronically Signed   By: Constance Holster M.D.   On: 04/09/2019 16:26

## 2019-04-22 MED ORDER — MORPHINE 100MG IN NS 100ML (1MG/ML) PREMIX INFUSION
5.0000 mg/h | INTRAVENOUS | Status: DC
  Administered 2019-04-22: 2 mg/h via INTRAVENOUS
  Filled 2019-04-22: qty 100

## 2019-04-22 MED ORDER — MORPHINE SULFATE (PF) 2 MG/ML IV SOLN
1.0000 mg | INTRAVENOUS | Status: DC | PRN
  Administered 2019-04-22: 1 mg via INTRAVENOUS
  Filled 2019-04-22: qty 1

## 2019-04-22 MED ORDER — MORPHINE SULFATE (PF) 2 MG/ML IV SOLN: 1.0000 mg | INTRAVENOUS | Status: DC | PRN

## 2019-04-22 MED ORDER — MORPHINE SULFATE (PF) 2 MG/ML IV SOLN: 2.0000 mg | INTRAVENOUS | Status: DC | PRN

## 2019-04-23 ENCOUNTER — Telehealth: Payer: Self-pay | Admitting: *Deleted

## 2019-04-23 DIAGNOSIS — J9601 Acute respiratory failure with hypoxia: Secondary | ICD-10-CM

## 2019-04-23 NOTE — Telephone Encounter (Signed)
Received call from pt daughter Lattie Haw stating that pt passed yesterday from Covid. Condolences offered. Physician team notified. No appts scheduled.

## 2019-05-01 ENCOUNTER — Other Ambulatory Visit: Payer: Medicare Other

## 2019-05-06 NOTE — Progress Notes (Signed)
Witnessed waste of MS with [employer]. Aprox 90-35ml

## 2019-05-06 NOTE — Plan of Care (Signed)
Patient came up from 1st floor to room 310. Patient has been tachypneic and moaning with movement since coming to floor and morphine dosage has been adjusted per MD to assist with comfort. Will cnt to monitor patient.  Problem: Education: Goal: Knowledge of risk factors and measures for prevention of condition will improve 04-29-19 1702 by Ria Clock, RN Outcome: Not Progressing 04/29/2019 1702 by Ria Clock, RN Outcome: Progressing   Problem: Coping: Goal: Psychosocial and spiritual needs will be supported 04-29-2019 1702 by Ria Clock, RN Outcome: Not Progressing 2019/04/29 1702 by Ria Clock, RN Outcome: Progressing   Problem: Respiratory: Goal: Will maintain a patent airway April 29, 2019 1702 by Ria Clock, RN Outcome: Not Progressing 04-29-2019 1702 by Ria Clock, RN Outcome: Progressing Goal: Complications related to the disease process, condition or treatment will be avoided or minimized 2019/04/29 1702 by Ria Clock, RN Outcome: Not Progressing 29-Apr-2019 1702 by Ria Clock, RN Outcome: Progressing   Problem: Education: Goal: Knowledge of General Education information will improve Description: Including pain rating scale, medication(s)/side effects and non-pharmacologic comfort measures 2019-04-29 1702 by Ria Clock, RN Outcome: Not Progressing 04-29-2019 1702 by Ria Clock, RN Outcome: Progressing   Problem: Health Behavior/Discharge Planning: Goal: Ability to manage health-related needs will improve 04-29-2019 1702 by Ria Clock, RN Outcome: Not Progressing 04-29-2019 1702 by Ria Clock, RN Outcome: Progressing   Problem: Clinical Measurements: Goal: Ability to maintain clinical measurements within normal limits will improve 2019/04/29 1702 by Ria Clock, RN Outcome: Not Progressing 04-29-19 1702 by Ria Clock, RN Outcome: Progressing Goal: Will remain free from infection 29-Apr-2019 1702 by Ria Clock, RN Outcome: Not  Progressing 29-Apr-2019 1702 by Ria Clock, RN Outcome: Progressing Goal: Diagnostic test results will improve 04-29-19 1702 by Ria Clock, RN Outcome: Not Progressing 04-29-19 1702 by Ria Clock, RN Outcome: Progressing Goal: Respiratory complications will improve 2019/04/29 1702 by Ria Clock, RN Outcome: Not Progressing 29-Apr-2019 1702 by Ria Clock, RN Outcome: Progressing Goal: Cardiovascular complication will be avoided 29-Apr-2019 1702 by Ria Clock, RN Outcome: Not Progressing 04/29/19 1702 by Ria Clock, RN Outcome: Progressing   Problem: Activity: Goal: Risk for activity intolerance will decrease 2019-04-29 1702 by Ria Clock, RN Outcome: Not Progressing Apr 29, 2019 1702 by Ria Clock, RN Outcome: Progressing   Problem: Nutrition: Goal: Adequate nutrition will be maintained 04/29/19 1702 by Ria Clock, RN Outcome: Not Progressing 2019/04/29 1702 by Ria Clock, RN Outcome: Progressing   Problem: Coping: Goal: Level of anxiety will decrease April 29, 2019 1702 by Ria Clock, RN Outcome: Not Progressing 04-29-2019 1702 by Ria Clock, RN Outcome: Progressing   Problem: Elimination: Goal: Will not experience complications related to bowel motility 29-Apr-2019 1702 by Ria Clock, RN Outcome: Not Progressing 2019/04/29 1702 by Ria Clock, RN Outcome: Progressing Goal: Will not experience complications related to urinary retention 04/29/2019 1702 by Ria Clock, RN Outcome: Not Progressing 04-29-19 1702 by Ria Clock, RN Outcome: Progressing   Problem: Pain Managment: Goal: General experience of comfort will improve 04-29-2019 1702 by Ria Clock, RN Outcome: Not Progressing 04/29/19 1702 by Ria Clock, RN Outcome: Progressing   Problem: Safety: Goal: Ability to remain free from injury will improve 04/29/19 1702 by Ria Clock, RN Outcome: Not Progressing 2019-04-29 1702 by Ria Clock, RN Outcome:  Progressing   Problem: Skin Integrity: Goal: Risk for impaired skin integrity  will decrease 05-02-2019 1702 by Ria Clock, RN Outcome: Not Progressing 05-02-19 1702 by Ria Clock, RN Outcome: Progressing

## 2019-05-06 NOTE — Progress Notes (Signed)
PROGRESS NOTE                                                                                                                                                                                                             Patient Demographics:    Emily Andrews, is a 85 y.o. female, DOB - 09/28/1934, LAG:[id number]  Outpatient Primary MD for the patient is Orpah Melter, MD    LOS - 12  Admit date - 04/09/2019    Chief Complaint  Patient presents with  . Shortness of Breath    Covid+       Brief Narrative   85 year old Caucasian female with past medical history significant for proximal atrial fibrillation, [place] vas 2 score of at least 3 on Coumadin, hypertension, hyperlipidemia and diabetes mellitus.  Apparently, patient tested positive for COVID-19 virus infection about 3 weeks ago, she presented with shortness of breath was diagnosed with acute hypoxic respiratory failure due to COVID-19 pneumonia and admitted to hospital.  She is with significant respiratory failure/ARDS, requiring 40 L/100% heated high flow at 1 point, she was treated with steroids, Actemra and remdesivir, had mild improvement of oxygen requirement, but overall she is very frail, weak, with very poor oral intake, multiple comorbidities, patient continues to have poor oral intake, extremely weak, discussion has been made with the family, decision has been made to proceed with comfort care.   Subjective:   Patient not provide any complaints, obtundent, no significant events as discussed with staff   Assessment  & Plan :   Acute Hypoxic Resp. Failure due to Acute Covid 19 Viral Pneumonitis during the ongoing 2020 Covid 19 Pandemic  - she has severe disease, significant oxygen requirement, she did require heated high flow 40 L / 100%, her oxygen requirement has improved since, recent was 15 L before she was transitioned to comfort care. -Treated with IV  steroids, -Received remdesivir -Received Actemra x2 -Was diuresed appropriately which she is currently euvolemic. -See discussion below regarding goals of care>    SpO2: (!) 73 % O2 Flow Rate (L/min): 10 L/min FiO2 (%): 100 %   Recent Labs  Lab 04/16/19 0800 04/16/19 0840 04/17/19 0355 04/18/19 0341 04/19/19 0010 04/20/19 0111  CRP  --  1.4* 0.9 0.7 0.7 0.6  DDIMER  --  10.94* 9.36* 7.83* 5.99* 3.62*  BNP  --  102.8* 70.9 68.2 77.9 74.2  PROCALCITON <0.10  --   --   --   --   --     Hepatic Function Latest Ref Rng & Units 04/20/2019 04/19/2019 04/18/2019  Total Protein 6.5 - 8.1 g/dL 6.4(L) 6.9 6.5  Albumin 3.5 - 5.0 g/dL 3.5 3.5 3.3(L)  AST 15 - 41 U/L 32 32 34  ALT 0 - 44 U/L _0 Alk Phosphatase 38 - 126 U/L 76 81 80  Total Bilirubin 0.3 - 1.2 mg/dL 1.4(H) 1.5(H) 2.0(H)  Bilirubin, Direct 0.0 - 0.3 mg/dL - - -     Acute on chronic diastolic CHF EF 57% on echocardiogram done in 2019 -was  on IV Lasix 80 mg IV twice daily, and Zaroxolyn, has stopped today given she appears dry, tachycardic with hypernatremia.  Hypokalemia -Repleted,   AKI  -could be ATN due to recent infection, was also on ARB.  - Renal ultrasound nonacute but does show chronic kidney disease.    Paroxysmal atrial fibrillation.   - [place] vas 2 score was greater than 3, she was on Coumadin > Eliquis > lovenox, and has been stopped currently -Is on flecainide  UTI. Finished Keflex for a total of 3 days.  Spike in D-dimer.  This happened after her vitamin K was reversed, question if she has thrown a clot, although she is on Eliquis will challenge her with a few days of Lovenox starting on 04/14/2019, she has also become quite short of breath after improvement on 04/14/2019.   Failure to thrive -Patient remains with poor appetite, started on Ensure, have liberalized her diet to regular  Goals of care: -Patient is extremely frail, deconditioned, with significant failure to thrive, no oral intake,  with increased work of breathing, significant discomfort from dyspnea, overall very poor prognosis in this patient with severe ARDS from COVID-19, continues to deteriorate, after discussion with the daughter, decision has been made to proceed with comfort care, for now we will start on palliative care order set.  Has been on as needed morphine, despite that she remains tachypneic, restless, so she has been transitioned to morphine drip currently.  Condition - Guarded +++  Family Communication  : Discussed with daughter via phone 1/17  Code Status :  DNR/comfort  Diet :   Diet Order            Diet regular Room service appropriate? Yes; Fluid consistency: Thin  Diet effective now               Disposition Plan  : Expected during hospital stay  Consults  :  None  Procedures  :    Renal US - non acute   DVT Prophylaxis  : None  Lab Results  Component Value Date   INR 2.0 (H) 04/12/2019   INR >10.0 (HH) 04/11/2019   INR >10.0 (HH) 04/26/2019     Lab Results  Component Value Date   PLT 362 04/20/2019    Inpatient Medications  Scheduled Meds: . feeding supplement (ENSURE ENLIVE)  237 mL Oral TID BM  . QUEtiapine  25 mg Oral BID   Continuous Infusions: . morphine 2 mg/hr (05/02/2019 1302)   PRN Meds:.acetaminophen, ALPRAZolam, alum & mag hydroxide-simeth, bisacodyl, docusate, fluticasone, haloperidol **OR** haloperidol **OR** haloperidol lactate, LORazepam **OR** LORazepam **OR** LORazepam, morphine injection, morphine CONCENTRATE **OR** morphine CONCENTRATE, ondansetron (ZOFRAN) IV, polyvinyl alcohol, sodium chloride, traZODone  Antibiotics  :    Anti-infectives (From admission, onward)   Start  Dose/Rate Route Frequency Ordered Stop   04/12/19 1000  remdesivir 100 mg in sodium chloride 0.9 % 100 mL IVPB  Status:  Discontinued     100 mg 200 mL/hr over 30 Minutes Intravenous Daily 04/11/19 0433 04/11/19 0446   04/12/19 1000  remdesivir 100 mg in sodium chloride  0.9 % 100 mL IVPB     100 mg 200 mL/hr over 30 Minutes Intravenous Daily 04/11/19 0447 04/15/19 1936   04/12/19 0800  cephALEXin (KEFLEX) capsule 250 mg     250 mg Oral Daily 04/12/19 0753 04/16/19 0802   04/12/19 0745  cephALEXin (KEFLEX) capsule 500 mg  Status:  Discontinued     500 mg Oral Every 12 hours 04/12/19 0736 04/12/19 0751   04/11/19 1000  remdesivir 100 mg in sodium chloride 0.9 % 100 mL IVPB  Status:  Discontinued     100 mg 200 mL/hr over 30 Minutes Intravenous Daily 04/14/2019 2234 04/11/19 0447   04/11/19 0500  azithromycin (ZITHROMAX) 500 mg in sodium chloride 0.9 % 250 mL IVPB  Status:  Discontinued     500 mg 250 mL/hr over 60 Minutes Intravenous Daily 04/11/19 0433 04/11/19 0734   04/11/19 0500  cefTRIAXone (ROCEPHIN) 1 g in sodium chloride 0.9 % 100 mL IVPB  Status:  Discontinued     1 g 200 mL/hr over 30 Minutes Intravenous Daily 04/11/19 0433 04/11/19 0734   04/11/19 0445  remdesivir 200 mg in sodium chloride 0.9% 250 mL IVPB  Status:  Discontinued     200 mg 580 mL/hr over 30 Minutes Intravenous Once 04/11/19 0433 04/11/19 0551   05/04/2019 2245  remdesivir 200 mg in sodium chloride 0.9% 250 mL IVPB     200 mg 580 mL/hr over 30 Minutes Intravenous Once 04/15/2019 2234 04/11/19 0618       Time Spent in minutes  15   Omir Cooprider M.D on 05-21-2019 at 4:26 PM  To page go to [email or web address] - password [facility]  [facility] -  Office  [phone]    See all Orders from today for further details    Objective:   Vitals:   04/21/19 0800 2019/05/21 1019 2019/05/21 1130 May 21, 2019 1220  BP:  (!) 106/58    Pulse: (!) 109     Resp: (!) 25 (!) 40 (!) 56 (!) 66  Temp:  (!) 96.8 F (36 C)    TempSrc:  Axillary    SpO2: 91% (!) 73%    Weight:      Height:        Wt Readings from Last 3 Encounters:  04/21/2019 73.5 kg  03/29/19 73.5 kg  02/04/19 75.1 kg     Intake/Output Summary (Last 24 hours) at 21-May-2019 1626 Last data filed at May 21, 2019 0600 Gross per  24 hour  Intake --  Output 400 ml  Net -400 ml     Physical Exam  Obtundent, unresponsive  Symmetrical chest wall movement  ext with no cyanosis  .   Data Review:    CBC Recent Labs  Lab 04/16/19 0840 04/17/19 0355 04/18/19 0341 04/19/19 0010 04/20/19 0111  WBC 8.6 8.2 8.8 9.8 12.9*  HGB 13.7 14.1 14.0 14.7 14.7  HCT 42.0 43.6 43.8 46.1* 44.8  PLT 300 321 345 372 362  MCV 92.5 92.8 94.0 94.3 93.5  MCH 30.2 30.0 30.0 30.1 30.7  MCHC 32.6 32.3 32.0 31.9 32.8  RDW 14.7 14.9 14.7 15.0 15.2  LYMPHSABS  --  1.3 1.6 1.3 1.6  MONOABS  --  0.3 0.6 0.6 1.0  EOSABS  --  0.1 0.0 0.0 0.0  BASOSABS  --  0.0 0.0 0.0 0.1    Chemistries  Recent Labs  Lab 04/16/19 0840 04/17/19 0355 04/18/19 0341 04/19/19 0010 04/20/19 0111  NA 143 145 145 147* 145  K 3.8 3.9 3.4* 3.7 4.3  CL 98 95* 94* 94* 95*  CO2 29 33* 35* 34* 33*  GLUCOSE 147* 165* 182* 224* 218*  BUN 99* 108* 96* 109* 141*  CREATININE 2.21* 2.08* 2.14* 2.09* 2.41*  CALCIUM 9.0 9.1 9.4 9.9 10.0  MG 2.0 2.1 2.3 2.5* 2.8*  AST  --  35 34 32 32  ALT  --  _0 ALKPHOS  --  83 80 81 76  BILITOT  --  1.7* 2.0* 1.5* 1.4*   ------------------------------------------------------------------------------------------------------------------ No results for input(s): CHOL, HDL, LDLCALC, TRIG, CHOLHDL, LDLDIRECT in the last 72 hours.  Lab Results  Component Value Date   HGBA1C 5.5 03/23/2018   ------------------------------------------------------------------------------------------------------------------ No results for input(s): TSH, T4TOTAL, T3FREE, THYROIDAB in the last 72 hours.  Invalid input(s): FREET3  Cardiac Enzymes No results for input(s): CKMB, TROPONINI, MYOGLOBIN in the last 168 hours.  Invalid input(s): CK ------------------------------------------------------------------------------------------------------------------    Component Value Date/Time   BNP 74.2 04/20/2019 0111    Micro  Results No results found for this or any previous visit (from the past 240 hour(s)).  Radiology Reports CT Head Wo Contrast  Result Date: 03/29/2019 CLINICAL DATA:  Poly trauma, fell this evening striking head on floor; past history of breast cancer, hypertension, diabetes mellitus, paroxysmal atrial fibrillation EXAM: CT HEAD WITHOUT CONTRAST CT CERVICAL SPINE WITHOUT CONTRAST TECHNIQUE: Multidetector CT imaging of the head and cervical spine was performed following the standard protocol without intravenous contrast. Multiplanar CT image reconstructions of the cervical spine were also generated. COMPARISON:  CT head 03/09/2018, MRI cervical spine 09/10/2007 FINDINGS: CT HEAD FINDINGS Brain: Generalized atrophy. Normal ventricular morphology. No midline shift or mass effect. Mild small vessel chronic ischemic changes of deep cerebral white matter. Streak artifacts at temporal lobes due to motion. Low-attenuation at LEFT occipital lobe, by sagittal images likely related to same artifacts. No intracranial hemorrhage, mass lesion or definite acute infarction. No extra-axial fluid collections. Vascular: Atherosclerotic calcifications of internal carotid arteries at skull base. Skull: Calvaria intact.  Scattered motion artifacts noted. Sinuses/Orbits: Clear Other: N/A CT CERVICAL SPINE FINDINGS Alignment: Anterolisthesis at C3-C4, unchanged. Remaining alignments normal. Skull base and vertebrae: Osseous demineralization. Skull base intact. Multilevel facet degenerative changes. Multilevel degenerative disc disease changes. Vertebral body heights maintained without fracture or bone destruction. Ankylosis of C4-C5 facet joints. Encroachment upon C3-C4 and C5-C6 neural foramina bilaterally as well as RIGHT C6-C7. Soft tissues and spinal canal: Prevertebral soft tissues normal thickness. Atherosclerotic calcifications within the carotid systems bilaterally. BILATERAL carotid bifurcations extend retropharyngeal.  Additional atherosclerotic calcifications at aortic arch. Disc levels:  No additional abnormalities Upper chest: Scarring at RIGHT apex. Other: N/A IMPRESSION: Atrophy with small vessel chronic ischemic changes of deep cerebral white matter. No acute intracranial abnormalities identified on exam limited by mild patient motion artifacts. Degenerative disc and facet disease changes of the cervical spine. No acute cervical spine abnormalities. Electronically Signed   By: Lavonia Dana M.D.   On: 03/29/2019 21:05   CT Cervical Spine Wo Contrast  Result Date: 03/29/2019 CLINICAL DATA:  Poly trauma, fell this evening striking head on floor; past history of breast cancer, hypertension, diabetes mellitus, paroxysmal atrial fibrillation EXAM: CT HEAD  WITHOUT CONTRAST CT CERVICAL SPINE WITHOUT CONTRAST TECHNIQUE: Multidetector CT imaging of the head and cervical spine was performed following the standard protocol without intravenous contrast. Multiplanar CT image reconstructions of the cervical spine were also generated. COMPARISON:  CT head 03/09/2018, MRI cervical spine 09/10/2007 FINDINGS: CT HEAD FINDINGS Brain: Generalized atrophy. Normal ventricular morphology. No midline shift or mass effect. Mild small vessel chronic ischemic changes of deep cerebral white matter. Streak artifacts at temporal lobes due to motion. Low-attenuation at LEFT occipital lobe, by sagittal images likely related to same artifacts. No intracranial hemorrhage, mass lesion or definite acute infarction. No extra-axial fluid collections. Vascular: Atherosclerotic calcifications of internal carotid arteries at skull base. Skull: Calvaria intact.  Scattered motion artifacts noted. Sinuses/Orbits: Clear Other: N/A CT CERVICAL SPINE FINDINGS Alignment: Anterolisthesis at C3-C4, unchanged. Remaining alignments normal. Skull base and vertebrae: Osseous demineralization. Skull base intact. Multilevel facet degenerative changes. Multilevel degenerative  disc disease changes. Vertebral body heights maintained without fracture or bone destruction. Ankylosis of C4-C5 facet joints. Encroachment upon C3-C4 and C5-C6 neural foramina bilaterally as well as RIGHT C6-C7. Soft tissues and spinal canal: Prevertebral soft tissues normal thickness. Atherosclerotic calcifications within the carotid systems bilaterally. BILATERAL carotid bifurcations extend retropharyngeal. Additional atherosclerotic calcifications at aortic arch. Disc levels:  No additional abnormalities Upper chest: Scarring at RIGHT apex. Other: N/A IMPRESSION: Atrophy with small vessel chronic ischemic changes of deep cerebral white matter. No acute intracranial abnormalities identified on exam limited by mild patient motion artifacts. Degenerative disc and facet disease changes of the cervical spine. No acute cervical spine abnormalities. Electronically Signed   By: Lavonia Dana M.D.   On: 03/29/2019 21:05   US Renal  Result Date: 04/25/2019 CLINICAL DATA:  Acute kidney injury EXAM: RENAL / URINARY TRACT ULTRASOUND COMPLETE COMPARISON:  None. FINDINGS: Right Kidney: Renal measurements: 7.9 x 4.2 x 3.9 cm = volume: 65.8 mL. There is increased cortical echogenicity. There is no hydronephrosis. Left Kidney: Renal measurements: 8.7 x 4.7 x 3.6 cm = volume: 77 mL. There is increased cortical echogenicity. There is no hydronephrosis. Bladder: Appears normal for degree of bladder distention. Other: None. IMPRESSION: 1. No acute abnormality. 2. Echogenic kidneys bilaterally which can be seen in patients with medical renal disease. Electronically Signed   By: Constance Holster M.D.   On: 04/30/2019 23:41   DG Chest Port 1 View  Result Date: 04/16/2019 CLINICAL DATA:  Shortness of breath COVID-19 pneumonia. EXAM: PORTABLE CHEST 1 VIEW COMPARISON:  One-view chest x-ray 04/14/2019 FINDINGS: Heart size is stable. Atherosclerotic calcifications present at the aortic arch. Progressive interstitial and airspace  opacities are present in both lungs, consistent with worsening pneumonia. There is new gaseous distention of the stomach. IMPRESSION: 1. Progressive interstitial and airspace disease in both lungs compatible with worsening pneumonia. 2. New gaseous distention of the stomach. 3. Aortic atherosclerosis. Electronically Signed   By: San Morelle M.D.   On: 04/16/2019 08:45   DG CHEST PORT 1 VIEW  Result Date: 04/15/2019 CLINICAL DATA:  Oxygen desaturation. ARDS. COVID-19 virus infection. EXAM: PORTABLE CHEST 1 VIEW COMPARISON:  04/14/2019 FINDINGS: Heart size is stable. Diffuse mixed interstitial and airspace opacity throughout both lungs shows no significant change. No pneumothorax or pleural effusion visualized. IMPRESSION: No significant change in diffuse mixed interstitial and airspace opacity throughout both lungs. Electronically Signed   By: Marlaine Hind M.D.   On: 04/15/2019 08:06   DG Chest Port 1 View  Result Date: 04/14/2019 CLINICAL DATA:  COVID-19 positive, dyspnea EXAM: PORTABLE  CHEST 1 VIEW COMPARISON:  04/14/2019 chest radiograph. FINDINGS: Stable cardiomediastinal silhouette with normal heart size. No pneumothorax. No pleural effusion. Extensive patchy opacities throughout both lungs appear worsened. IMPRESSION: Worsening extensive patchy opacities throughout both lungs, compatible with COVID-19 pneumonia. Electronically Signed   By: Ilona Sorrel M.D.   On: 04/14/2019 11:44   DG Chest Portable 1 View  Result Date: 04/20/2019 CLINICAL DATA:  Hypoxia. EXAM: PORTABLE CHEST 1 VIEW COMPARISON:  01/01/2019 FINDINGS: There are diffuse bilateral hazy ground-glass airspace opacities, greatest within the left lung zone. The heart size is borderline enlarged. Aortic calcifications are noted. There is no pneumothorax or large pleural effusion. There is no acute osseous abnormality. IMPRESSION: Diffuse bilateral ground-glass airspace opacities consistent with the patient's history of viral  pneumonia. Electronically Signed   By: Constance Holster M.D.   On: 04/07/2019 16:26

## 2019-05-06 NOTE — Progress Notes (Addendum)
Notified MD d/t patient cnts to have RR in 50's and 60's. Patient loudly moaning when turned now. No new orders @ this time, will monitor.  1815- MD to change current orders d/t pain and RR.

## 2019-05-06 NOTE — Progress Notes (Addendum)
57 Spoke with MD regarding patient's moaning and restlessness with tachypnea despite morphine given. Morphine order changed. This was administered along with Ativan IVP d/t extreme restlessness and RR >50 @ this time.   -----  1220 Notified MD d/t patient remains unchanged and RR are now >60. MD ordered morphine gtt.

## 2019-05-06 NOTE — Progress Notes (Signed)
Spoke with husband to update on patient's status. Questions answered.

## 2019-05-06 NOTE — Progress Notes (Addendum)
Wasted 90-95 mg of morphine with Calton Golds. Patients belongings in bag. Family, AC and MD notified by Caryl Never @1900 . [facility] called by Network engineer.

## 2019-05-06 NOTE — Progress Notes (Signed)
Patient's family updated on status and situation this morning.  Patient appears comfortable and does not look dyspneic or in distress in bed.  Morphine given x 1.  Plan for patient to transfer to third floor to continue comfort cares.

## 2019-05-06 NOTE — Progress Notes (Addendum)
Spoke with patient's daughter - unable to reach husband. Patient passed away @ 1900. Andria Frames MD notified. Verified with other RN.

## 2019-05-06 NOTE — Progress Notes (Signed)
Patient arrived to room 310. Personal items at bedside. Mild dyspnea noted, medication provided, see MAR.

## 2019-05-06 NOTE — Death Summary Note (Signed)
DEATH SUMMARY   Patient Details  Name: Emily Andrews MRN: [id number] DOB: 14-Dec-1934  Admission/Discharge Information   Admit Date:  Apr 26, 2019  Date of Death: Date of Death: 05-08-19(Simultaneous filing. User may not have seen previous data.)  Time of Death: Time of Death: 1900(Simultaneous filing. User may not have seen previous data.)  Length of Stay: 12  Referring Physician: Orpah Melter, MD   Reason(s) for Hospitalization  Shortness of breath due to COVID-19 for pneumonia  Diagnoses  Preliminary cause of death:   -Acute respiratory disease due to COVID-19 up pneumonia.  Secondary Diagnoses (including complications and co-morbidities):  Active Problems:   Pneumonia due to severe acute respiratory syndrome coronavirus 2 (SARS-CoV-2) Acute on chronic diastolic CHF Acute kidney injury Paroxysmal A. Fib elevated D-dimer Failure to thrive  Brief Hospital Course (including significant findings, care, treatment, and services provided and events leading to death)  Emily Andrews is a 85 y.o. year old female  with past medical history significant for proximal atrial fibrillation, [place] vas 2 score of at least 3 on Coumadin, hypertension, hyperlipidemia and diabetes mellitus. Apparently, patient tested positive for COVID-19 virus infection about 3 weeks.  To admission, she presented with shortness of breath was diagnosed with acute hypoxic respiratory failure due to COVID-19 pneumonia and admitted to hospital.  She is with significant respiratory failure/ARDS, requiring 40 L/100% heated high flow at one point, he did receive Actemra x2, she did finish her course of remdesivir, and she did receive IV steroids as well, respiratory status continue to new to be very tenuous during hospital stay despite above management, she had elevated D-dimers, will she was kept on Lovenox full dose during hospital stay, as well she was treated with aggressive IV diuresis due to to her CHF  exacerbation, diuresis has been stopped given hypernatremia, and poor oral intake, as well patient is extremely frail, deconditioned, as well with failure to thrive, and during hospital stay she had very poor oral intake respiratory status continued to be critical during entire hospital stay, inpatient poor baseline, and lack of improvement, significant dyspnea, decision has been made to proceed with comfort measures, initially on as needed morphine, then she was transitioned to morphine drip given her significant tachypnea, and discomfort, and her morphine drip has been titrated where she has been comfortable, patient expired 2022/05/07.  Acute Hypoxic Resp. Failure due to Acute Covid 19 Viral Pneumonitis during the ongoing 2020 Covid 19 Pandemic  - she has severe disease, significant oxygen requirement, she did require heated high flow 40 L / 100%, her oxygen requirement, treated with steroids, remdesivir, Actemra, and diuresis .  Acute on chronic diastolic CHF EF 50% on echocardiogram done in 2019 -Was on IV diuresis, which has been stopped given tachycardia and hypernatremia .  Hypokalemia   AKI  -could be ATN due to recent infection, was also on ARB.  - Renal ultrasound nonacute but does show chronic kidney disease.    Paroxysmal atrial fibrillation.  -Flecainide and full anticoagulation   UTI. -Treated with Keflex  Spike in D-dimer.   -Kept on full anticoagulation during hospital stay  Failure to thrive -Patient remains with poor appetite, started on Ensure, have liberalized her diet to regular   Pertinent Labs and Studies  Significant Diagnostic Studies CT Head Wo Contrast  Result Date: 03/29/2019 CLINICAL DATA:  Poly trauma, fell this evening striking head on floor; past history of breast cancer, hypertension, diabetes mellitus, paroxysmal atrial fibrillation EXAM: CT HEAD WITHOUT CONTRAST CT CERVICAL SPINE  WITHOUT CONTRAST TECHNIQUE: Multidetector CT imaging of the head and  cervical spine was performed following the standard protocol without intravenous contrast. Multiplanar CT image reconstructions of the cervical spine were also generated. COMPARISON:  CT head 03/09/2018, MRI cervical spine 09/10/2007 FINDINGS: CT HEAD FINDINGS Brain: Generalized atrophy. Normal ventricular morphology. No midline shift or mass effect. Mild small vessel chronic ischemic changes of deep cerebral white matter. Streak artifacts at temporal lobes due to motion. Low-attenuation at LEFT occipital lobe, by sagittal images likely related to same artifacts. No intracranial hemorrhage, mass lesion or definite acute infarction. No extra-axial fluid collections. Vascular: Atherosclerotic calcifications of internal carotid arteries at skull base. Skull: Calvaria intact.  Scattered motion artifacts noted. Sinuses/Orbits: Clear Other: N/A CT CERVICAL SPINE FINDINGS Alignment: Anterolisthesis at C3-C4, unchanged. Remaining alignments normal. Skull base and vertebrae: Osseous demineralization. Skull base intact. Multilevel facet degenerative changes. Multilevel degenerative disc disease changes. Vertebral body heights maintained without fracture or bone destruction. Ankylosis of C4-C5 facet joints. Encroachment upon C3-C4 and C5-C6 neural foramina bilaterally as well as RIGHT C6-C7. Soft tissues and spinal canal: Prevertebral soft tissues normal thickness. Atherosclerotic calcifications within the carotid systems bilaterally. BILATERAL carotid bifurcations extend retropharyngeal. Additional atherosclerotic calcifications at aortic arch. Disc levels:  No additional abnormalities Upper chest: Scarring at RIGHT apex. Other: N/A IMPRESSION: Atrophy with small vessel chronic ischemic changes of deep cerebral white matter. No acute intracranial abnormalities identified on exam limited by mild patient motion artifacts. Degenerative disc and facet disease changes of the cervical spine. No acute cervical spine abnormalities.  Electronically Signed   By: Lavonia Dana M.D.   On: 03/29/2019 21:05   CT Cervical Spine Wo Contrast  Result Date: 03/29/2019 CLINICAL DATA:  Poly trauma, fell this evening striking head on floor; past history of breast cancer, hypertension, diabetes mellitus, paroxysmal atrial fibrillation EXAM: CT HEAD WITHOUT CONTRAST CT CERVICAL SPINE WITHOUT CONTRAST TECHNIQUE: Multidetector CT imaging of the head and cervical spine was performed following the standard protocol without intravenous contrast. Multiplanar CT image reconstructions of the cervical spine were also generated. COMPARISON:  CT head 03/09/2018, MRI cervical spine 09/10/2007 FINDINGS: CT HEAD FINDINGS Brain: Generalized atrophy. Normal ventricular morphology. No midline shift or mass effect. Mild small vessel chronic ischemic changes of deep cerebral white matter. Streak artifacts at temporal lobes due to motion. Low-attenuation at LEFT occipital lobe, by sagittal images likely related to same artifacts. No intracranial hemorrhage, mass lesion or definite acute infarction. No extra-axial fluid collections. Vascular: Atherosclerotic calcifications of internal carotid arteries at skull base. Skull: Calvaria intact.  Scattered motion artifacts noted. Sinuses/Orbits: Clear Other: N/A CT CERVICAL SPINE FINDINGS Alignment: Anterolisthesis at C3-C4, unchanged. Remaining alignments normal. Skull base and vertebrae: Osseous demineralization. Skull base intact. Multilevel facet degenerative changes. Multilevel degenerative disc disease changes. Vertebral body heights maintained without fracture or bone destruction. Ankylosis of C4-C5 facet joints. Encroachment upon C3-C4 and C5-C6 neural foramina bilaterally as well as RIGHT C6-C7. Soft tissues and spinal canal: Prevertebral soft tissues normal thickness. Atherosclerotic calcifications within the carotid systems bilaterally. BILATERAL carotid bifurcations extend retropharyngeal. Additional atherosclerotic  calcifications at aortic arch. Disc levels:  No additional abnormalities Upper chest: Scarring at RIGHT apex. Other: N/A IMPRESSION: Atrophy with small vessel chronic ischemic changes of deep cerebral white matter. No acute intracranial abnormalities identified on exam limited by mild patient motion artifacts. Degenerative disc and facet disease changes of the cervical spine. No acute cervical spine abnormalities. Electronically Signed   By: Lavonia Dana M.D.   On: 03/29/2019  21:05   US Renal  Result Date: 04/30/2019 CLINICAL DATA:  Acute kidney injury EXAM: RENAL / URINARY TRACT ULTRASOUND COMPLETE COMPARISON:  None. FINDINGS: Right Kidney: Renal measurements: 7.9 x 4.2 x 3.9 cm = volume: 65.8 mL. There is increased cortical echogenicity. There is no hydronephrosis. Left Kidney: Renal measurements: 8.7 x 4.7 x 3.6 cm = volume: 77 mL. There is increased cortical echogenicity. There is no hydronephrosis. Bladder: Appears normal for degree of bladder distention. Other: None. IMPRESSION: 1. No acute abnormality. 2. Echogenic kidneys bilaterally which can be seen in patients with medical renal disease. Electronically Signed   By: Constance Holster M.D.   On: 04/24/2019 23:41   DG Chest Port 1 View  Result Date: 04/16/2019 CLINICAL DATA:  Shortness of breath COVID-19 pneumonia. EXAM: PORTABLE CHEST 1 VIEW COMPARISON:  One-view chest x-ray 04/14/2019 FINDINGS: Heart size is stable. Atherosclerotic calcifications present at the aortic arch. Progressive interstitial and airspace opacities are present in both lungs, consistent with worsening pneumonia. There is new gaseous distention of the stomach. IMPRESSION: 1. Progressive interstitial and airspace disease in both lungs compatible with worsening pneumonia. 2. New gaseous distention of the stomach. 3. Aortic atherosclerosis. Electronically Signed   By: San Morelle M.D.   On: 04/16/2019 08:45   DG CHEST PORT 1 VIEW  Result Date: 04/15/2019 CLINICAL DATA:   Oxygen desaturation. ARDS. COVID-19 virus infection. EXAM: PORTABLE CHEST 1 VIEW COMPARISON:  04/14/2019 FINDINGS: Heart size is stable. Diffuse mixed interstitial and airspace opacity throughout both lungs shows no significant change. No pneumothorax or pleural effusion visualized. IMPRESSION: No significant change in diffuse mixed interstitial and airspace opacity throughout both lungs. Electronically Signed   By: Marlaine Hind M.D.   On: 04/15/2019 08:06   DG Chest Port 1 View  Result Date: 04/14/2019 CLINICAL DATA:  COVID-19 positive, dyspnea EXAM: PORTABLE CHEST 1 VIEW COMPARISON:  04/18/2019 chest radiograph. FINDINGS: Stable cardiomediastinal silhouette with normal heart size. No pneumothorax. No pleural effusion. Extensive patchy opacities throughout both lungs appear worsened. IMPRESSION: Worsening extensive patchy opacities throughout both lungs, compatible with COVID-19 pneumonia. Electronically Signed   By: Ilona Sorrel M.D.   On: 04/14/2019 11:44   DG Chest Portable 1 View  Result Date: 04/26/2019 CLINICAL DATA:  Hypoxia. EXAM: PORTABLE CHEST 1 VIEW COMPARISON:  01/01/2019 FINDINGS: There are diffuse bilateral hazy ground-glass airspace opacities, greatest within the left lung zone. The heart size is borderline enlarged. Aortic calcifications are noted. There is no pneumothorax or large pleural effusion. There is no acute osseous abnormality. IMPRESSION: Diffuse bilateral ground-glass airspace opacities consistent with the patient's history of viral pneumonia. Electronically Signed   By: Constance Holster M.D.   On: 04/27/2019 16:26    Microbiology No results found for this or any previous visit (from the past 240 hour(s)).  Lab Basic Metabolic Panel: Recent Labs  Lab 04/17/19 0355 04/18/19 0341 04/19/19 0010 04/20/19 0111  NA 145 145 147* 145  K 3.9 3.4* 3.7 4.3  CL 95* 94* 94* 95*  CO2 33* 35* 34* 33*  GLUCOSE 165* 182* 224* 218*  BUN 108* 96* 109* 141*  CREATININE 2.08*  2.14* 2.09* 2.41*  CALCIUM 9.1 9.4 9.9 10.0  MG 2.1 2.3 2.5* 2.8*   Liver Function Tests: Recent Labs  Lab 04/17/19 0355 04/18/19 0341 04/19/19 0010 04/20/19 0111  AST 35 34 32 32  ALT 26 26 26 25   ALKPHOS 83 80 81 76  BILITOT 1.7* 2.0* 1.5* 1.4*  PROT 6.2* 6.5 6.9 6.4*  ALBUMIN 2.9* 3.3* 3.5 3.5   No results for input(s): LIPASE, AMYLASE in the last 168 hours. No results for input(s): AMMONIA in the last 168 hours. CBC: Recent Labs  Lab 04/17/19 0355 04/18/19 0341 04/19/19 0010 04/20/19 0111  WBC 8.2 8.8 9.8 12.9*  NEUTROABS 6.4 6.5 7.8* 10.1*  HGB 14.1 14.0 14.7 14.7  HCT 43.6 43.8 46.1* 44.8  MCV 92.8 94.0 94.3 93.5  PLT 321 345 372 362   Cardiac Enzymes: No results for input(s): CKTOTAL, CKMB, CKMBINDEX, TROPONINI in the last 168 hours. Sepsis Labs: Recent Labs  Lab 04/17/19 0355 04/18/19 0341 04/19/19 0010 04/20/19 0111  WBC 8.2 8.8 9.8 12.9*    Procedures/Operations     Keeya Dyckman 04/23/2019, 4:32 PM

## 2019-05-06 DEATH — deceased

## 2019-05-15 ENCOUNTER — Other Ambulatory Visit: Payer: Medicare Other

## 2019-06-03 ENCOUNTER — Ambulatory Visit: Payer: Medicare Other | Admitting: Hematology

## 2019-06-03 ENCOUNTER — Other Ambulatory Visit: Payer: Medicare Other

## 2020-07-07 IMAGING — CT CT CERVICAL SPINE W/O CM
3 of 4 series · 13 of 35 positions shown, 16 images · non-contrast
Comparison: CT head 03/09/2018, MRI cervical spine 09/10/2007

CLINICAL DATA: Poly trauma, fell this evening striking head on
floor; past history of breast cancer, hypertension, diabetes
mellitus, paroxysmal atrial fibrillation

EXAM:
CT HEAD WITHOUT CONTRAST
CT CERVICAL SPINE WITHOUT CONTRAST
TECHNIQUE: Multidetector CT imaging of the head and cervical spine was
performed following the standard protocol without intravenous
contrast. Multiplanar CT image reconstructions of the cervical spine
were also generated.

[Series 8: sag bone · sagittal · 0.26mm/px · 5 of 62 slices shown, 6 images]
[im 21/62  bone]
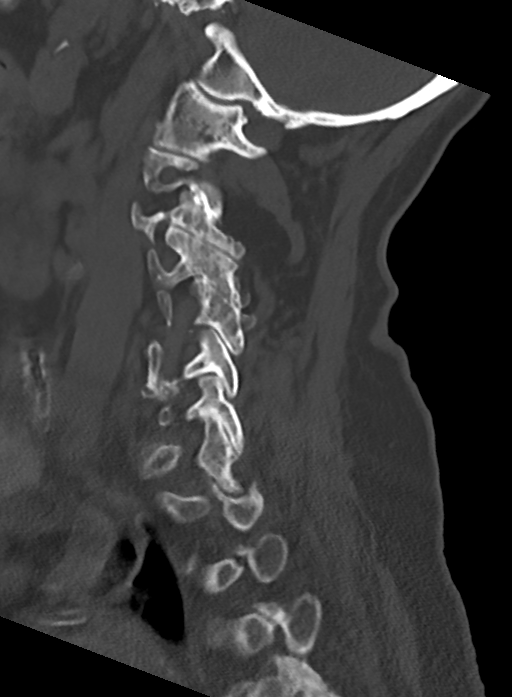
[im 26/62  bone]
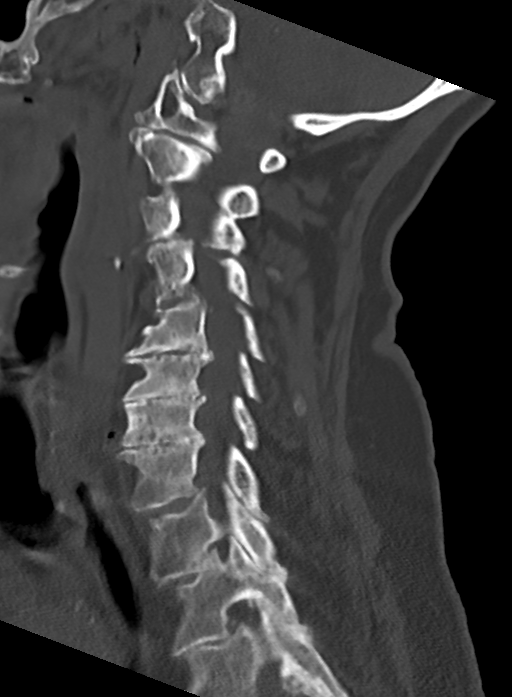
[im 31/62  soft-tissue]
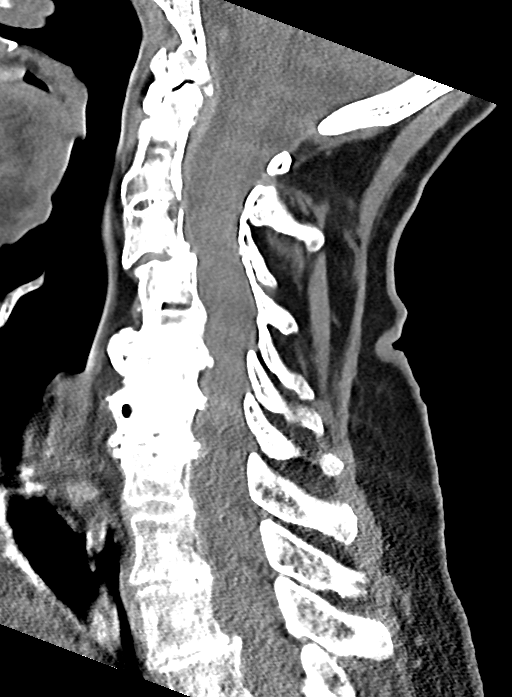
[im 31/62  bone]
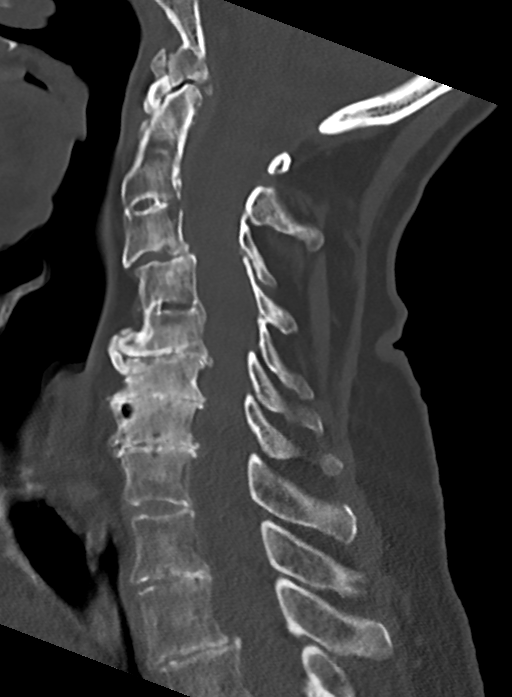
[im 36/62  bone]
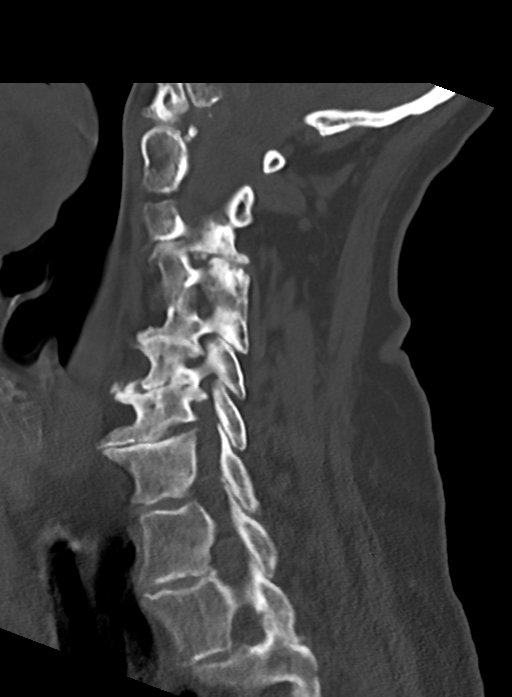
[im 41/62  bone]
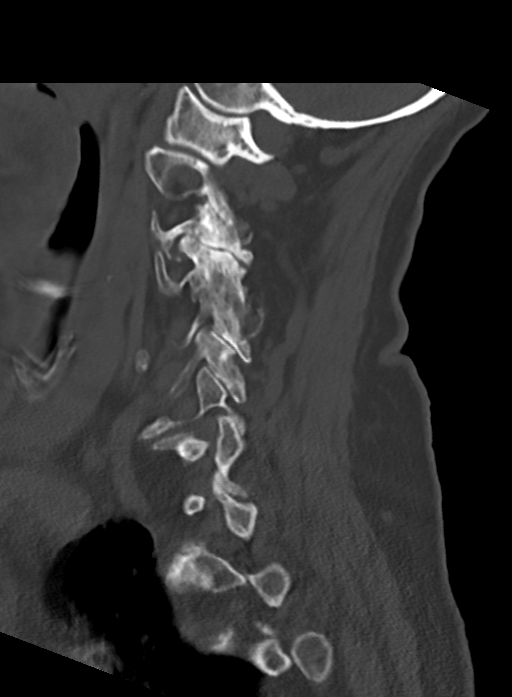

[Series 9: cor bone · coronal · 0.29mm/px · 3 of 54 slices shown]
[im 11/54  bone]
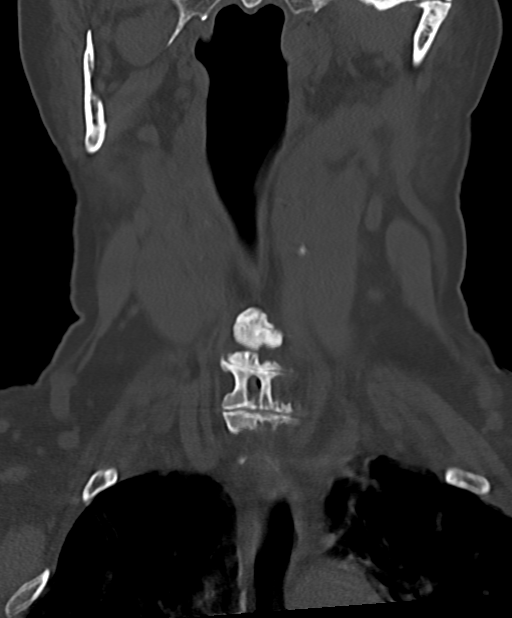
[im 22/54  bone]
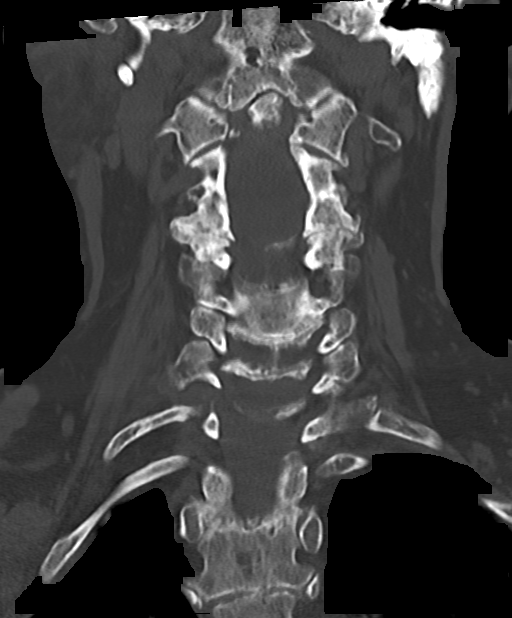
[im 32/54  bone]
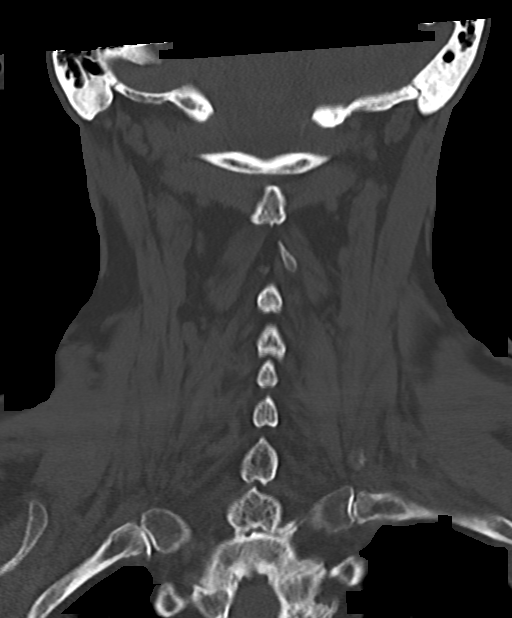

[Series 10: orthogonal axials · axial · 0.21mm/px · z∈[-312,-204]mm · 5 of 94 slices shown, 7 images]
[im 16/94  soft-tissue]
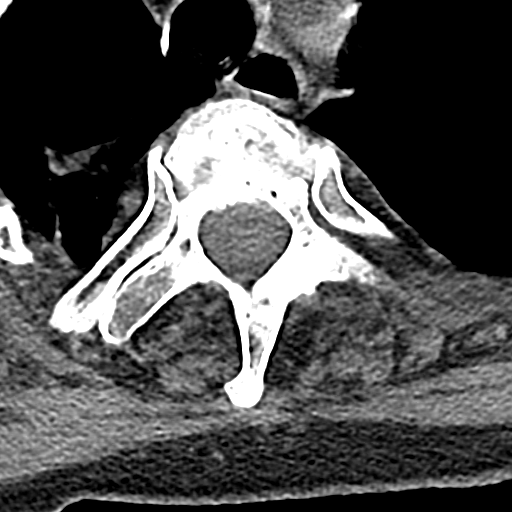
[im 16/94  bone]
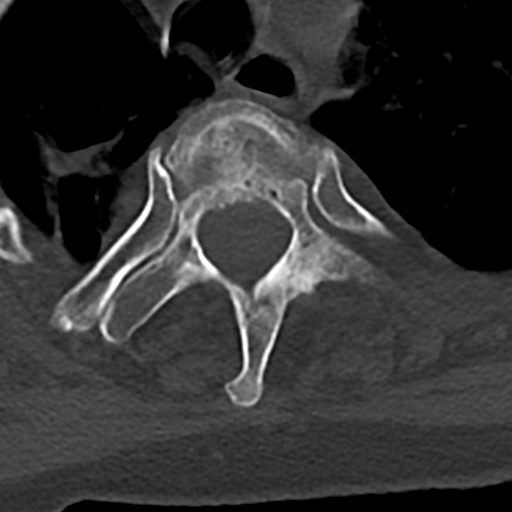
[im 32/94  bone]
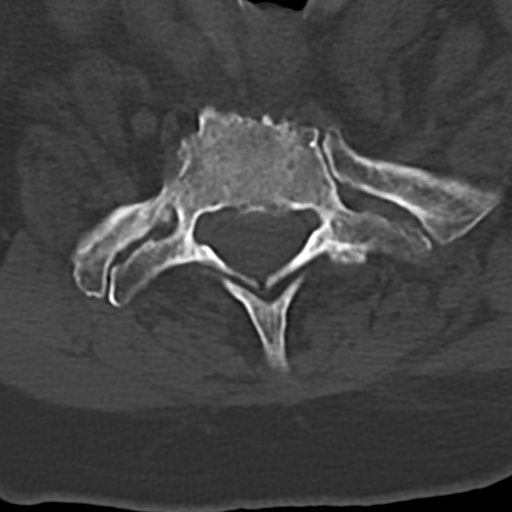
[im 47/94  bone]
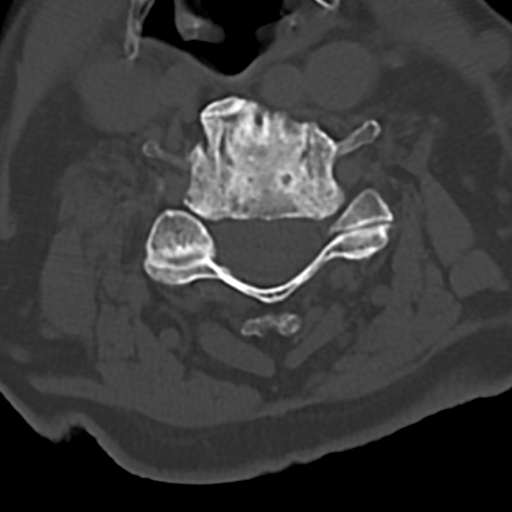
[im 63/94  bone]
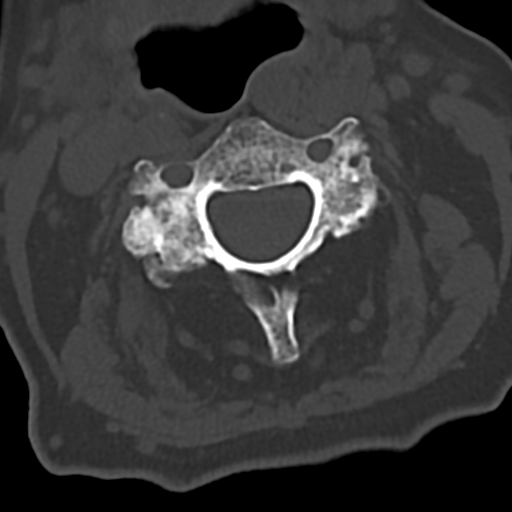
[im 78/94  soft-tissue]
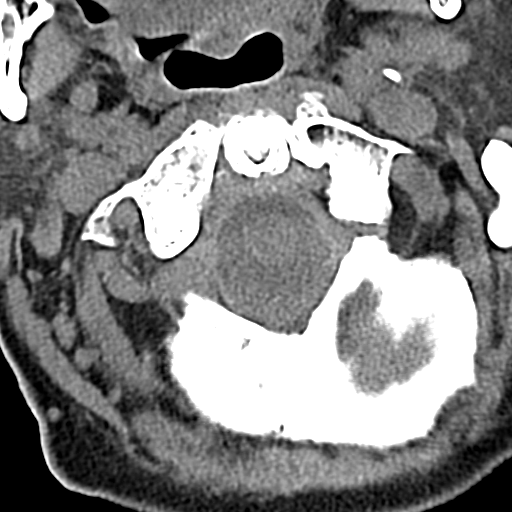
[im 78/94  bone]
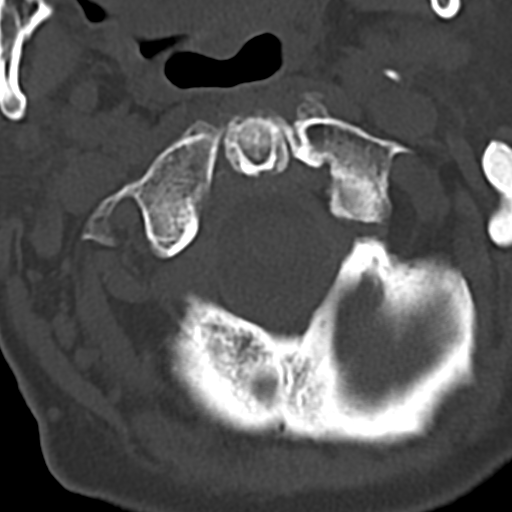

[13 of 35 positions shown; findings below may reference images not displayed]

FINDINGS: CT HEAD FINDINGS

Brain: Generalized atrophy. Normal ventricular morphology. No
midline shift or mass effect. Mild small vessel chronic ischemic
changes of deep cerebral white matter. Streak artifacts at temporal
lobes due to motion. Low-attenuation at LEFT occipital lobe, by
sagittal images likely related to same artifacts. No intracranial
hemorrhage, mass lesion or definite acute infarction. No extra-axial
fluid collections.

Vascular: Atherosclerotic calcifications of internal carotid
arteries at skull base.

Skull: Calvaria intact.  Scattered motion artifacts noted.

Sinuses/Orbits: Clear

Other: N/A

CT CERVICAL SPINE FINDINGS

Alignment: Anterolisthesis at C3-C4, unchanged. Remaining alignments
normal.

Skull base and vertebrae: Osseous demineralization. Skull base
intact. Multilevel facet degenerative changes. Multilevel
degenerative disc disease changes. Vertebral body heights maintained
without fracture or bone destruction. Ankylosis of C4-C5 facet
joints. Encroachment upon C3-C4 and C5-C6 neural foramina
bilaterally as well as RIGHT C6-C7.

Soft tissues and spinal canal: Prevertebral soft tissues normal
thickness. Atherosclerotic calcifications within the carotid systems
bilaterally. BILATERAL carotid bifurcations extend retropharyngeal.
Additional atherosclerotic calcifications at aortic arch.

Disc levels:  No additional abnormalities

Upper chest: Scarring at RIGHT apex.

Other: N/A
IMPRESSION: Atrophy with small vessel chronic ischemic changes of deep cerebral
white matter.

No acute intracranial abnormalities identified on exam limited by
mild patient motion artifacts.

Degenerative disc and facet disease changes of the cervical spine.

No acute cervical spine abnormalities.

## 2020-07-24 IMAGING — DX DG CHEST 1V PORT
1 series · 1 of 1 positions shown · non-contrast
Comparison: 04/14/2019

CLINICAL DATA: Oxygen desaturation. ARDS. [id number] virus infection.

EXAM:
PORTABLE CHEST 1 VIEW

[chest]
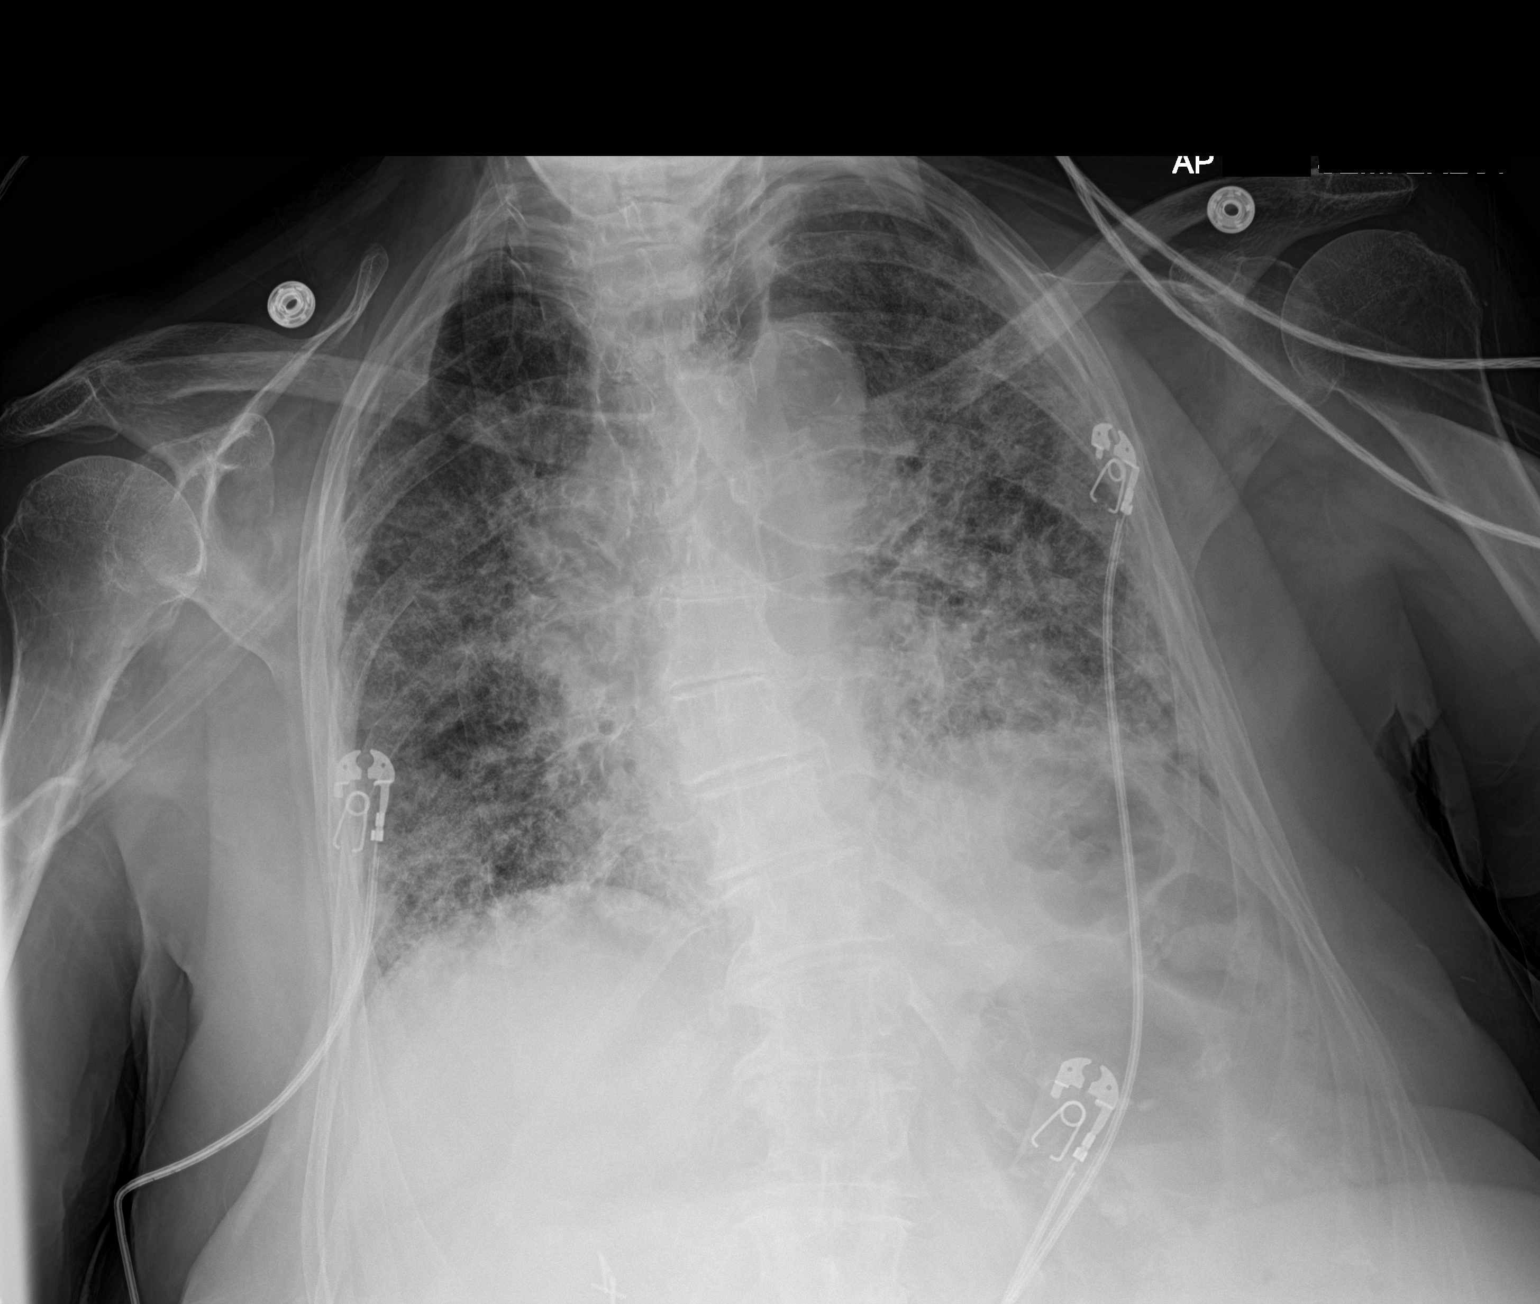

[1 of 1 positions shown; findings below may reference images not displayed]

FINDINGS: Heart size is stable. Diffuse mixed interstitial and airspace
opacity throughout both lungs shows no significant change. No
pneumothorax or pleural effusion visualized.
IMPRESSION: No significant change in diffuse mixed interstitial and airspace
opacity throughout both lungs.
